# Patient Record
Sex: Female | Born: 1970
Health system: Southern US, Community
[De-identification: ages and names within clinical notes are randomized; demographics above are authoritative.]

## PROBLEM LIST (undated history)

## (undated) DIAGNOSIS — F419 Anxiety disorder, unspecified: Secondary | ICD-10-CM

## (undated) DIAGNOSIS — K5900 Constipation, unspecified: Secondary | ICD-10-CM

## (undated) DIAGNOSIS — R011 Cardiac murmur, unspecified: Secondary | ICD-10-CM

## (undated) DIAGNOSIS — B009 Herpesviral infection, unspecified: Secondary | ICD-10-CM

## (undated) DIAGNOSIS — T7840XA Allergy, unspecified, initial encounter: Secondary | ICD-10-CM

## (undated) DIAGNOSIS — N200 Calculus of kidney: Secondary | ICD-10-CM

## (undated) DIAGNOSIS — F319 Bipolar disorder, unspecified: Secondary | ICD-10-CM

## (undated) DIAGNOSIS — I10 Essential (primary) hypertension: Secondary | ICD-10-CM

## (undated) DIAGNOSIS — H409 Unspecified glaucoma: Secondary | ICD-10-CM

## (undated) DIAGNOSIS — G039 Meningitis, unspecified: Secondary | ICD-10-CM

## (undated) DIAGNOSIS — D649 Anemia, unspecified: Secondary | ICD-10-CM

## (undated) DIAGNOSIS — F32A Depression, unspecified: Secondary | ICD-10-CM

## (undated) DIAGNOSIS — I6529 Occlusion and stenosis of unspecified carotid artery: Secondary | ICD-10-CM

## (undated) DIAGNOSIS — J9819 Other pulmonary collapse: Secondary | ICD-10-CM

## (undated) DIAGNOSIS — E119 Type 2 diabetes mellitus without complications: Secondary | ICD-10-CM

## (undated) DIAGNOSIS — M4120 Other idiopathic scoliosis, site unspecified: Secondary | ICD-10-CM

## (undated) HISTORY — DX: Type 2 diabetes mellitus without complications: E11.9

## (undated) HISTORY — DX: Constipation, unspecified: K59.00

## (undated) HISTORY — DX: Calculus of kidney: N20.0

## (undated) HISTORY — DX: Other idiopathic scoliosis, site unspecified: M41.20

## (undated) HISTORY — DX: Anxiety disorder, unspecified: F41.9

## (undated) HISTORY — DX: Herpesviral infection, unspecified: B00.9

## (undated) HISTORY — DX: Allergy, unspecified, initial encounter: T78.40XA

## (undated) HISTORY — DX: Meningitis, unspecified: G03.9

## (undated) HISTORY — DX: Depression, unspecified: F32.A

## (undated) HISTORY — DX: Anemia, unspecified: D64.9

## (undated) HISTORY — DX: Bipolar disorder, unspecified: F31.9

## (undated) HISTORY — DX: Unspecified glaucoma: H40.9

## (undated) HISTORY — PX: TYMPANOSTOMY TUBE PLACEMENT: SHX32

## (undated) HISTORY — DX: Cardiac murmur, unspecified: R01.1

## (undated) HISTORY — PX: CHEST TUBE INSERTION: SHX231

## (undated) HISTORY — DX: Other pulmonary collapse: J98.19

## (undated) HISTORY — DX: Occlusion and stenosis of unspecified carotid artery: I65.29

---

## 1998-03-14 ENCOUNTER — Emergency Department (HOSPITAL_COMMUNITY): Admission: EM | Admit: 1998-03-14 | Discharge: 1998-03-14 | Payer: Self-pay | Admitting: Emergency Medicine

## 1999-11-27 ENCOUNTER — Ambulatory Visit (HOSPITAL_COMMUNITY): Admission: RE | Admit: 1999-11-27 | Discharge: 1999-11-27 | Payer: Self-pay | Admitting: *Deleted

## 2000-07-13 ENCOUNTER — Emergency Department (HOSPITAL_COMMUNITY): Admission: EM | Admit: 2000-07-13 | Discharge: 2000-07-13 | Payer: Self-pay | Admitting: Emergency Medicine

## 2000-07-13 ENCOUNTER — Encounter: Payer: Self-pay | Admitting: Emergency Medicine

## 2001-01-29 ENCOUNTER — Encounter: Payer: Self-pay | Admitting: Internal Medicine

## 2001-01-29 ENCOUNTER — Ambulatory Visit (HOSPITAL_COMMUNITY): Admission: RE | Admit: 2001-01-29 | Discharge: 2001-01-29 | Payer: Self-pay | Admitting: Internal Medicine

## 2001-03-11 ENCOUNTER — Encounter: Payer: Self-pay | Admitting: Family Medicine

## 2001-03-11 ENCOUNTER — Ambulatory Visit (HOSPITAL_COMMUNITY): Admission: RE | Admit: 2001-03-11 | Discharge: 2001-03-11 | Payer: Self-pay | Admitting: Family Medicine

## 2001-08-17 ENCOUNTER — Encounter: Payer: Self-pay | Admitting: Internal Medicine

## 2001-08-17 ENCOUNTER — Encounter: Payer: Self-pay | Admitting: Emergency Medicine

## 2001-08-17 ENCOUNTER — Inpatient Hospital Stay (HOSPITAL_COMMUNITY): Admission: EM | Admit: 2001-08-17 | Discharge: 2001-08-25 | Payer: Self-pay | Admitting: *Deleted

## 2001-08-23 ENCOUNTER — Encounter: Payer: Self-pay | Admitting: Internal Medicine

## 2001-08-26 ENCOUNTER — Emergency Department (HOSPITAL_COMMUNITY): Admission: EM | Admit: 2001-08-26 | Discharge: 2001-08-26 | Payer: Self-pay

## 2001-08-30 ENCOUNTER — Encounter: Admission: RE | Admit: 2001-08-30 | Discharge: 2001-08-30 | Payer: Self-pay | Admitting: Internal Medicine

## 2001-10-02 ENCOUNTER — Encounter: Payer: Self-pay | Admitting: Emergency Medicine

## 2001-10-02 ENCOUNTER — Emergency Department (HOSPITAL_COMMUNITY): Admission: EM | Admit: 2001-10-02 | Discharge: 2001-10-02 | Payer: Self-pay | Admitting: Emergency Medicine

## 2001-10-13 ENCOUNTER — Ambulatory Visit (HOSPITAL_COMMUNITY): Admission: RE | Admit: 2001-10-13 | Discharge: 2001-10-13 | Payer: Self-pay | Admitting: Internal Medicine

## 2001-11-03 ENCOUNTER — Ambulatory Visit (HOSPITAL_COMMUNITY): Admission: RE | Admit: 2001-11-03 | Discharge: 2001-11-03 | Payer: Self-pay | Admitting: Internal Medicine

## 2003-03-02 ENCOUNTER — Other Ambulatory Visit: Admission: RE | Admit: 2003-03-02 | Discharge: 2003-03-02 | Payer: Self-pay | Admitting: Family Medicine

## 2003-11-17 ENCOUNTER — Emergency Department (HOSPITAL_COMMUNITY): Admission: EM | Admit: 2003-11-17 | Discharge: 2003-11-17 | Payer: Self-pay | Admitting: Emergency Medicine

## 2004-03-14 ENCOUNTER — Other Ambulatory Visit: Admission: RE | Admit: 2004-03-14 | Discharge: 2004-03-14 | Payer: Self-pay | Admitting: Family Medicine

## 2004-11-06 ENCOUNTER — Inpatient Hospital Stay (HOSPITAL_COMMUNITY): Admission: EM | Admit: 2004-11-06 | Discharge: 2004-11-08 | Payer: Self-pay | Admitting: *Deleted

## 2004-11-07 ENCOUNTER — Encounter (INDEPENDENT_AMBULATORY_CARE_PROVIDER_SITE_OTHER): Payer: Self-pay | Admitting: Cardiology

## 2004-11-11 ENCOUNTER — Emergency Department (HOSPITAL_COMMUNITY): Admission: EM | Admit: 2004-11-11 | Discharge: 2004-11-11 | Payer: Self-pay | Admitting: Emergency Medicine

## 2004-11-21 ENCOUNTER — Encounter: Admission: RE | Admit: 2004-11-21 | Discharge: 2004-11-21 | Payer: Self-pay | Admitting: Family Medicine

## 2004-12-08 ENCOUNTER — Encounter: Admission: RE | Admit: 2004-12-08 | Discharge: 2004-12-08 | Payer: Self-pay | Admitting: Family Medicine

## 2005-06-05 ENCOUNTER — Encounter: Admission: RE | Admit: 2005-06-05 | Discharge: 2005-06-05 | Payer: Self-pay | Admitting: Family Medicine

## 2005-09-02 ENCOUNTER — Encounter: Admission: RE | Admit: 2005-09-02 | Discharge: 2005-09-02 | Payer: Self-pay | Admitting: Family Medicine

## 2008-05-08 ENCOUNTER — Other Ambulatory Visit (HOSPITAL_COMMUNITY): Admission: RE | Admit: 2008-05-08 | Discharge: 2008-05-26 | Payer: Self-pay | Admitting: Psychiatry

## 2008-05-08 ENCOUNTER — Ambulatory Visit: Payer: Self-pay | Admitting: Psychiatry

## 2008-10-26 ENCOUNTER — Emergency Department (HOSPITAL_COMMUNITY): Admission: EM | Admit: 2008-10-26 | Discharge: 2008-10-26 | Payer: Self-pay | Admitting: Family Medicine

## 2009-05-31 ENCOUNTER — Ambulatory Visit (HOSPITAL_COMMUNITY): Admission: RE | Admit: 2009-05-31 | Discharge: 2009-05-31 | Payer: Self-pay | Admitting: Psychiatry

## 2009-06-19 ENCOUNTER — Other Ambulatory Visit (HOSPITAL_COMMUNITY): Admission: RE | Admit: 2009-06-19 | Discharge: 2009-07-04 | Payer: Self-pay | Admitting: Psychiatry

## 2009-06-20 ENCOUNTER — Ambulatory Visit: Payer: Self-pay | Admitting: Psychiatry

## 2010-06-16 ENCOUNTER — Emergency Department (HOSPITAL_COMMUNITY): Admission: EM | Admit: 2010-06-16 | Discharge: 2010-06-16 | Payer: Self-pay | Admitting: Family Medicine

## 2010-08-11 ENCOUNTER — Encounter: Payer: Self-pay | Admitting: Obstetrics and Gynecology

## 2010-10-30 ENCOUNTER — Ambulatory Visit (HOSPITAL_COMMUNITY)
Admission: RE | Admit: 2010-10-30 | Discharge: 2010-10-30 | Disposition: A | Discharge status: Home / Self Care | Payer: 59 | Source: Ambulatory Visit | Attending: Emergency Medicine | Admitting: Emergency Medicine

## 2010-10-30 ENCOUNTER — Encounter (HOSPITAL_COMMUNITY): Payer: Self-pay

## 2010-10-30 ENCOUNTER — Other Ambulatory Visit: Payer: Self-pay | Admitting: Emergency Medicine

## 2010-10-30 DIAGNOSIS — R0789 Other chest pain: Secondary | ICD-10-CM | POA: Insufficient documentation

## 2010-10-30 DIAGNOSIS — R000 Tachycardia, unspecified: Secondary | ICD-10-CM

## 2010-10-30 DIAGNOSIS — K7689 Other specified diseases of liver: Secondary | ICD-10-CM | POA: Insufficient documentation

## 2010-10-30 DIAGNOSIS — R609 Edema, unspecified: Secondary | ICD-10-CM | POA: Insufficient documentation

## 2010-10-30 DIAGNOSIS — R160 Hepatomegaly, not elsewhere classified: Secondary | ICD-10-CM | POA: Insufficient documentation

## 2010-10-30 DIAGNOSIS — R0602 Shortness of breath: Secondary | ICD-10-CM | POA: Insufficient documentation

## 2010-10-30 HISTORY — DX: Essential (primary) hypertension: I10

## 2010-10-30 MED ORDER — IOHEXOL 300 MG/ML  SOLN
100.0000 mL | Freq: Once | INTRAMUSCULAR | Status: AC | PRN
  Administered 2010-10-30: 100 mL via INTRAVENOUS

## 2010-11-03 ENCOUNTER — Inpatient Hospital Stay (INDEPENDENT_AMBULATORY_CARE_PROVIDER_SITE_OTHER)
Admission: RE | Admit: 2010-11-03 | Discharge: 2010-11-03 | Disposition: A | Discharge status: Home / Self Care | Payer: 59 | Source: Ambulatory Visit | Attending: Family Medicine | Admitting: Family Medicine

## 2010-11-03 DIAGNOSIS — M79609 Pain in unspecified limb: Secondary | ICD-10-CM

## 2010-12-06 NOTE — Op Note (Signed)
[place]  Patient:    Selena Holt, Selena Holt Visit Number: [id number] MRN: [id number]          Service Type: MED Location: [id number] Attending Physician:  Alfonso Ramus Dictated by:   Kristine Garbe Ezzard Standing, M.D. Proc. Date: 08/19/01 Admit Date:  08/17/2001                             Operative Report  PREOPERATIVE DIAGNOSIS:  Right otitis media with effusion.  History of pneumococcal meningitis.  POSTOPERATIVE DIAGNOSIS:  Right otitis media with effusion.  History of pneumococcal meningitis.  OPERATION PERFORMED:  Right myringotomy and tube (Paparella type 1 tube).  SURGEON:  Kristine Garbe. Ezzard Standing, M.D.  ANESTHESIA:  Local phenol topical.  DESCRIPTION OF PROCEDURE:  The patient was brought from her room to the operating room.  The ear canal was examined.  There were some inflammatory changes at the TM as well as the ear canal making placement of the tube anteriorly difficult.  The tube was placed posteriorly inferiorly in the TM. Phenol was applied to the posterior portion of the TM for local anesthetic. Myringotomy was made.  A mucoserous fluid was aspirated from the middle ear space and a Paparella type 1 tube was inserted via the myringotomy site followed by Pedotic ear drops.  Selena Holt tolerated this well and was transferred back to her room.  DISPOSITION:   She was instructed to use Pedotic ear drops, 3 or 4 drops twice a day for the next four days. Dictated by:   Kristine Garbe Ezzard Standing, M.D. Attending Physician:  Alfonso Ramus DD:  08/19/01 TD:  08/20/01 Job: [id number]

## 2010-12-06 NOTE — Discharge Summary (Signed)
[place]  Patient:    Selena Holt, Selena Holt Visit Number: [id number] MRN: [id number]          Service Type: EMS Location: [place] Attending Physician:  Lorre Nick Dictated by:   Lendell Caprice, M.D. Admit Date:  10/02/2001 Discharge Date: 10/02/2001   CC:         [facility]  Dr. Kristine Garbe. Ezzard Standing, ENT  Dr. Roxan Hockey in infectious diseases   Discharge Summary  DISCHARGE DIAGNOSES:  1. Pneumococcal meningitis with bacteremia, current hospitalization.  2. Right otitis media with right mastoiditis, current hospitalization.  3. Hypertension.  4. Normocytic anemia.  5. Depression.  6. Hypokalemia.  7. History of asthma.  8. History of seasonal allergies.  9. Dyspepsia versus gastroesophageal reflux disease. 10. Vaginal spotting on Depo-Provera, current hospitalization. 11. Hyperglycemia secondary to steroids; hemoglobin A1c 5.1.  DISCHARGE MEDICATIONS:  1. Tequin 200 mg p.o. q.d. starting August 31, 2001 for two weeks of     therapy.  2. Rocephin 2 g IV q.12h. for five more days - to be done by [facility] in the home.  3. Claritin 10 mg p.o. q.d.  4. Flonase 50 mcg two sprays in each nostril once a day.  5. Wellbutrin 150 mg p.o. b.i.d.  6. Pepcid 20 mg p.o. b.i.d.  7. HCTZ 25 mg p.o. q.d.  8. Darvocet-N 100 one pill q.4h. p.r.n. pain.  FOLLOW-UP:  Ms. Jordahl will see Dr. Ezzard Standing in otolaryngology in follow-up on September 02, 2001 at 1:15 p.m. for follow-up of right myringotomy and right mastoiditis.  She will also report for hospital follow-up visit in internal medicine clinic to see Dr. Reed Pandy on August 30, 2001 at 1:30 p.m. at which time I will check a follow-up BMET to assess her electrolyte status, especially her potassium.  Home health care will be administering Ms. Carters IV antibiotic therapy for five more days after discharge.  She is instructed to return to the emergency room if symptoms of fever return or  neck stiffness or headache.  The patient was also instructed to call her OB/GYN to schedule a follow-up visit for her vaginal spotting on Depo-Provera.  PROCEDURES AND DIAGNOSTIC STUDIES: 1. A lumbar puncture was performed under fluoroscopy in radiology on August 17, 2001 without complications.  Four samples of cloudy spinal fluid    were sent to the laboratory and the closing pressure appeared to be    elevated.  The patient tolerated the procedure well. 2. Status post PICC line placement in radiology on August 23, 2001, placed in    the right arm without complications. 3. Head CT without contrast performed August 17, 2001.  Impression:    Opacified right mastoid.  There is a polyp associated with the right    maxillary sinus.  There is also minimal mucosal membrane thickening in    the region of the right ethmoid sinus. 4. Chest x-ray on August 17, 2001.  Impression:  No acute disease.  CONSULTANTS: 1. Ear, nose, and throat:  Dr. Ezzard Standing. 2. Infectious diseases:  Dr. Roxan Hockey.  HISTORY OF PRESENT ILLNESS:  Selena Holt is a 40 year old white female with a past medical history of depression, hypertension, and asthma who presented to the emergency room with a five-day history of headache and malaise.  History is provided by the patients mother and daughter who state that she was in her usual state of health until Friday when she developed a headache.  She  continued to feel bad over the weekend and yesterday started vomiting.  She saw her physician at [facility] and was diagnosed with otitis media with a TM perforation and given a prescription for Cipro, for which she took one dose of this medication.  Her headache progressed, suddenly got worse, and the patient complained of not being able to move because of severe neck pain.  At approximately 2 a.m. on the morning of admission, the patients daughter called the patients mother because the patient was getting worse.  When  the mother arrived the patient was complaining of decreased vision and was incontinent of urine secondary to her pain.  The patient had no true temperatures recorded; however, a subjective fever was reported.  ADMISSION LABORATORY DATA:  White count 16.2, hemoglobin 11.8, platelets 261, MCV 90.1, 93% polys, ANC 15.1, mild left shift.  Sodium 138, potassium 3.8, chloride 106, CO2 21, BUN 7, creatinine 1.1, glucose 187, alk phos 108, T bili 0.6, AST 20, ALT 23, total protein 7.4, albumin 3.8, calcium 9.4.  UA positive for 250 glucose, 15 ketones, 30 protein, negative micro.  CSF tube #1:  3800 white blood cells, 131 red blood cells, 96% polys; tube #2:  4600 white blood cells, 56 red blood cells, 99% polys, protein 163.  CSF glucose 71.  Gram stain showed abundant white blood cells, primarily polys, gram positive cocci in pairs and chains.  HOSPITAL COURSE: 1. PNEUMOCOCCAL MENINGITIS WITH BACTEREMIA:  The patient was admitted to the intensive care unit for observation and IV antibiotics.  The patients CSF grew out Streptococcus pneumoniae that was pansensitive.  However, initially the patient was started on Rocephin intravenously as well as vancomycin and Decadron intravenously.  The patients mental status improved during the first 24 hours of her admission.  She was alert and oriented on hospital day #2. She still complained of neck pain on hospital day #2 and #3; however, this improved throughout her hospitalization and she was free of neck pain and headache on the day of discharge.  She did describe mild symptoms of headache which she has with her sinus disease on the day of discharge and was given Darvocet to be used in the outpatient setting.  She was instructed if she started to have the recurrence of severe headache to return to the emergency room, as well as any evidence of fever or neck pain.  CSF cultures grew out  Streptococcus pneumoniae that were pansensitive, as well as her  blood cultures were also positive for Streptococcus pneumoniae.  The patient was hemodynamically-stable and survived her hospitalization without difficulty. On August 19, 2001 the vancomycin was discontinued because the sensitives showed pansensitive Streptococcus pneumoniae.  The etiology of this meningitis was likely secondary to an acute right otitis media that led to a right-sided mastoiditis.  ENT was consulted during this hospitalization and at first it was felt that no surgery was indicated.  Later in the hospitalization the patient complained of increased right ear pain and Dr. Ezzard Standing saw the patient and performed a myringotomy with tube placement in the right ear.  Ms. Brougham pain decreased significantly after this procedure and she will see Dr. Ezzard Standing in follow-up after discharge.  She was sent home on intravenous Rocephin which she will obtain a total of 14 days of IV therapy of this medication.  She will then start Tequin therapy because she is PENICILLIN allergic and this will continue for two more weeks orally.  The patient was seen by infectious diseases during  this hospitalization and we appreciate their input on this patient.  At their suggestion, the pneumococcal vaccine was administered for this patient and an HIV test was obtained and found to be negative.  The patient had a PICC line placed prior to discharge for receiving her IV antibiotic therapy at home per [facility].  #2 - OTITIS MEDIA OF THE RIGHT TYMPANIC MEMBRANE WITH MASTOIDITIS:  Patient underwent head CT which showed evidence of right mastoiditis.  ENT was consulted and the patient was not taken to surgery for the mastoiditis secondary to their feeling that there was no abscess or focal area to surgically intervene on.  The patient was treated with medications and did undergo a right myringotomy with tube placement in the right ear secondary to continued right ear pain.  The patient will be  discharged on antibiotics and will see Dr. Ezzard Standing in follow-up after discharge.  #3 - HISTORY OF ASTHMA:  The patient denies use of inhalers over the past 10 years; however, was started on Flonase and Claritin as she was taking those at home prior to admission.  She had no evidence of asthma exacerbation during this hospitalization.  #4 - GASTROESOPHAGEAL REFLUX DISEASE VERSUS DYSPEPSIA:  The patient had mild nausea with abdominal discomfort during her hospitalization that responded very well to Pepcid therapy.  She was discharged on this H2 blocker and can be followed up by her primary care physician at [facility].  #5 - HYPERTENSION:  The patients blood pressure was normal on admission and she reports taking propranolol in the past.  Secondary to her asthma we elected not to start propranolol and instead started hydrochlorothiazide which she was discharged on secondary to elevated blood pressures near her hospital discharge date.  Her blood pressures on hydrochlorothiazide were 120s to 130s systolic over 70s to 80s diastolic.  Her potassium will be checked at her hospital follow-up visit.  #6 - HYPERGLYCEMIA:  The patients CBG was elevated secondary to steroid use. Hemoglobin A1c was checked and found to be 5.1.  #7 - DEPRESSION:  The patient had several episodes of crying during this hospitalization and reports acute life stressors but denies suicidal ideation. She takes Wellbutrin and this was continued at discharge for her depression. She will be seen by her primary care physician at [facility] in follow-up.  #8 - VAGINAL SPOTTING:  The patient had one episode of vaginal spotting during her hospitalization and she is currently on Depo-Provera per her OB/GYN.  It is not surprising that this occurred secondary to her acute illness and she will be seen in follow-up after discharge by her primary GYN.  #9 - NORMOCYTIC ANEMIA:  The patients hemoglobin was as low as 9.3  during this hospitalization; however, she had a normal MCV and she is a menstruating female who is multiparous and this anemia is likely secondary to menstruation.  Her hemoglobin on the day of discharge was 12.4.  DISCHARGE LABORATORY DATA:  White blood count 10.3, hemoglobin 12.4, platelets 297.  Sodium 141, potassium 3.3, chloride 102, CO2 27, BUN 13, creatinine 1.0, glucose 119. Dictated by:   Lendell Caprice, M.D. Attending Physician:  Lorre Nick DD:  11/18/01 TD:  11/18/01 Job: [id number]

## 2010-12-06 NOTE — Discharge Summary (Signed)
Selena Holt, Selena Holt               ACCOUNT NO.:  [id number]   MEDICAL RECORD NO.:  [id number]          PATIENT TYPE:  INP   LOCATION:  2017                         FACILITY:  [facility]   PHYSICIAN:  Selena L. Ladona Ridgel, MD  DATE OF BIRTH:  May 29, 1971   DATE OF ADMISSION:  11/06/2004  DATE OF DISCHARGE:  11/08/2004                                 DISCHARGE SUMMARY   DISCHARGING DIAGNOSES:  1.  Sinus tachycardia with shortness of breath and chest discomfort to IV      Lopressor in the emergency room.  The patient was admitted and evaluated      for possible underlying ischemic heart disease.  Her 2-day MyoView      revealed no obvious ischemia or dysrhythmia.  During the course of the      hospital stay she had minor sinus tachycardia but no obvious SVT was      noted.  The patient reports that she intermittently with different      activities will have palpitations.  I will recommend that Dr. Kevan Holt      consider an electrophysiology outpatient appointment with a Holter      monitor.  2.  Depression.  The patient will be continued on her Depakote and      Wellbutrin at home.  The medications were held initially during the      hospital stay but will be restarted on the day of discharge.  3.  Mild hypotension.  The patient will hold her hydrochlorothiazide and      resume a lower dosed Lopressor until seen by Dr. Kevan Holt, at which time      she may need to have her medications retitrated upward.  4.  Hyperlipidemia.  At this time the patient will be given a prescription      for Zocor in light of her family history and risk factors for coronary      artery disease.  However, I have discussed dietary measures that she      could institute herself at home including weight loss and avoiding      cholesterol in her diet.  I will recommend further education by her      primary care physician and the patient may decide not to fill that      prescription if she chooses to do dietary control to  start.   MEDICATIONS AT THE TIME OF DISCHARGE WILL BE:  1.  Lopressor 25 mg twice daily.  2.  Zocor 40 mg q.h.s.  3.  Wellbutrin 300 mg q.h.s.  4.  Hydrochlorothiazide will be held until she sees Dr. Kevan Holt.  5.  Depakote 2000 mg q.h.s.   DIET:  She is to avoid cholesterol and no salt in her diet.   SPECIAL INSTRUCTIONS:  She did during the hospital stay have some discomfort  in the left arm where the IV was placed.  There is no erythema but if she  were to develop redness or swelling she should contact her primary care  physician.  I have apprised her of the signs and symptoms of infection.  FOLLOW UP:  I have asked her to see Dr. Kevan Holt next week to consider a  possible event monitor and to evaluate the changes in her medication.   HISTORY OF PRESENT ILLNESS:  The patient is a 40 year old white female who  presented to the emergency room with increased shortness of breath  associated with palpitations and some chest discomfort.  She was treated  with Lopressor and admitted to the telemetry floor for further evaluation.  During the hospital stay she did have a 2D echocardiogram which showed an  ejection fraction of 55-65%, regional wall motion abnormalities could not be  fully evaluated on this study.  There was mild mitral valvular regurgitation  and because of the inability to fully evaluate she was referred on to a 2-  day MyoView study which showed no obvious ischemia.  Further evaluations for  the patient's tachyarrhythmia were a thyroid study which showed a normal  TSH.  She also underwent an evaluation of her D-dimer which was negative  which risk stratified her for potential for PE.  A CT of the chest was not  completed during the course of this hospital stay.  Should she develop any  further pulmonary symptoms, however, a CT of her chest may be warranted to  rule out possible PE; however, her symptoms appear to be low probability for  this diagnosis.  On the day of  discharge, the patient underwent the second  half of her MyoView without complications.  As stated, there were no  ischemic changes. Her vital signs were noted to be afebrile at 97.4, blood  pressure ranged from 93/66 to 101/70.  There was value recorded earlier in  the day of 85/40.  We will, therefore, be recommending that she hold her  hydrochlorothiazide until she sees Dr. Kevan Holt and has further evaluation of  her blood pressure.  Her Lopressor also has been cut in half because of her  borderline low blood pressures.   PHYSICAL EXAM ON THE DAY OF DISCHARGE:  She is normocephalic, atraumatic.  Pupils equal, round, reactive to light.  Extraocular muscles are intact.  Mucous membranes are moist.  NECK:  Supple.  There is no JVD, no lymph nodes and no carotid bruits.  CHEST:  Clear to auscultation.  There is no rhonchi, rales or wheezes.  CARDIOVASCULAR:  Regular rate, rhythm.  Positive S1, S2.  No S3, S4.  No  murmurs, rubs or gallops.  ABDOMEN:  Soft, nontender, nondistended.  EXTREMITIES:  Show no clubbing, cyanosis or edema.  Left arm shows no  erythema at the previous IV site.  The area is moderately tender but shows  no signs of infection.  NEUROLOGICALLY:  She is nonfocal.   PERTINENT LABORATORY VALUES DURING THE COURSE OF THE HOSPITAL STAY:  As  stated, her TSH is within normal limits.  A urine drug screen was negative.  D-dimer was within normal limits.  Cardiac enzymes showed a very slight  elevation in her troponin and very slight elevation in her CK of unclear  significance.  A chest x-ray showed only peribronchial thickening with poor  inspiratory effort.   At this time, the patient is deemed stable for discharge for followup as an  outpatient.  As stated, we will recommend a query of cardiology regarding  possible Holter monitor as the patient still reports periods of  tachyarrhythmia during and without exertion that may not have been representatively investigated with  the Cardiolite stress.  I, therefore, am  recommending that she  have an event monitor placed, if this is appropriate  with cardiology, and would request her primary care physician to inquire  regarding this.   At the time of discharge the patient is deemed stable.  She will followup  with Dr. Kevan Holt next week and understands that her medications have been  changed.  We have reviewed dietary changes and I have given her the option  to hold the Zocor prescription should she choose to try lifestyle  modifications for her elevated cholesterol.      MLT/MEDQ  D:  11/08/2004  T:  11/10/2004  Job:  [id number]   cc:   Duncan Dull, M.D.  [address]  [place]  [place] [address]  Fax: [phone]   Meade Maw, M.D.  [address]  [place]  [place] [address]  Fax: [phone]

## 2012-06-01 ENCOUNTER — Emergency Department (HOSPITAL_COMMUNITY)
Admission: EM | Admit: 2012-06-01 | Discharge: 2012-06-01 | Disposition: A | Discharge status: Home / Self Care | Payer: Self-pay | Attending: Emergency Medicine | Admitting: Emergency Medicine

## 2012-06-01 ENCOUNTER — Encounter (HOSPITAL_COMMUNITY): Payer: Self-pay | Admitting: *Deleted

## 2012-06-01 ENCOUNTER — Emergency Department (HOSPITAL_COMMUNITY): Payer: Self-pay

## 2012-06-01 DIAGNOSIS — J069 Acute upper respiratory infection, unspecified: Secondary | ICD-10-CM

## 2012-06-01 DIAGNOSIS — M549 Dorsalgia, unspecified: Secondary | ICD-10-CM | POA: Insufficient documentation

## 2012-06-01 DIAGNOSIS — R0789 Other chest pain: Secondary | ICD-10-CM | POA: Insufficient documentation

## 2012-06-01 DIAGNOSIS — Z79899 Other long term (current) drug therapy: Secondary | ICD-10-CM | POA: Insufficient documentation

## 2012-06-01 DIAGNOSIS — I10 Essential (primary) hypertension: Secondary | ICD-10-CM | POA: Insufficient documentation

## 2012-06-01 LAB — BASIC METABOLIC PANEL
BUN: 9 mg/dL (ref 6–23)
CO2: 26 mEq/L (ref 19–32)
Calcium: 10.1 mg/dL (ref 8.4–10.5)
Chloride: 99 mEq/L (ref 96–112)
Creatinine, Ser: 0.68 mg/dL (ref 0.50–1.10)
GFR calc Af Amer: >90 mL/min (ref >90)
GFR calc non Af Amer: >90 mL/min (ref >90)
Glucose, Bld: 149 mg/dL — ABNORMAL HIGH (ref 70–99)
Potassium: 4.1 mEq/L (ref 3.5–5.1)
Sodium: 138 mEq/L (ref 135–145)

## 2012-06-01 LAB — CBC WITH DIFFERENTIAL/PLATELET
Basophils Absolute: 0 10*3/uL (ref 0.0–0.1)
Basophils Relative: 0 % (ref 0–1)
Eosinophils Absolute: 0.1 10*3/uL (ref 0.0–0.7)
Eosinophils Relative: 1 % (ref 0–5)
HCT: 36.5 % (ref 36.0–46.0)
Hemoglobin: 12.4 g/dL (ref 12.0–15.0)
Lymphocytes Relative: 13 % (ref 12–46)
Lymphs Abs: 1.2 10*3/uL (ref 0.7–4.0)
MCH: 29.9 pg (ref 26.0–34.0)
MCHC: 34 g/dL (ref 30.0–36.0)
MCV: 88 fL (ref 78.0–100.0)
Monocytes Absolute: 0.7 10*3/uL (ref 0.1–1.0)
Monocytes Relative: 8 % (ref 3–12)
Neutro Abs: 7.1 10*3/uL (ref 1.7–7.7)
Neutrophils Relative %: 78 % — ABNORMAL HIGH (ref 43–77)
Platelets: 253 10*3/uL (ref 150–400)
RBC: 4.15 MIL/uL (ref 3.87–5.11)
RDW: 14 % (ref 11.5–15.5)
WBC: 9.1 10*3/uL (ref 4.0–10.5)

## 2012-06-01 LAB — POCT I-STAT TROPONIN I
Troponin i, poc: 0 ng/mL (ref 0.00–0.08)
Troponin i, poc: 0 ng/mL (ref 0.00–0.08)

## 2012-06-01 MED ORDER — ACETAMINOPHEN 500 MG PO TABS: 500.0000 mg | ORAL_TABLET | Freq: Four times a day (QID) | ORAL | Status: DC | PRN

## 2012-06-01 MED ORDER — IBUPROFEN 600 MG PO TABS: 600.0000 mg | ORAL_TABLET | Freq: Four times a day (QID) | ORAL | Status: DC | PRN

## 2012-06-01 MED ORDER — KETOROLAC TROMETHAMINE 30 MG/ML IJ SOLN
30.0000 mg | Freq: Once | INTRAMUSCULAR | Status: AC
  Administered 2012-06-01: 30 mg via INTRAVENOUS
  Filled 2012-06-01: qty 1

## 2012-06-01 MED ORDER — SODIUM CHLORIDE 0.9 % IV BOLUS (SEPSIS)
1000.0000 mL | Freq: Once | INTRAVENOUS | Status: AC
  Administered 2012-06-01: 1000 mL via INTRAVENOUS

## 2012-06-01 NOTE — ED Notes (Signed)
Pt had diarrhea for 2 weeks, then started with congestion and now hurting in chest and back and coughing up thick green sputum.  Pt reports some chills and breaks out into sweats

## 2012-06-01 NOTE — ED Provider Notes (Addendum)
41 year old female with a two-day history of postnasal drip, cough productive of green sputum. There's been no fever or chills. X-ray is unremarkable. She will be treated as a viral URI.  I saw and evaluated the patient, reviewed the resident's note and I agree with the findings and plan.   Dione Booze, MD 06/01/12 [id number]   Date: 06/01/2012  Rate: 118  Rhythm: sinus tachycardia  QRS Axis: normal  Intervals: normal  ST/T Wave abnormalities: normal  Conduction Disutrbances:none  Narrative Interpretation:  sinus tachycardia, poor R-wave progression across precordium. When compared with ECG of 11/07/2004, no significant changes are seen   Old EKG Reviewed: unchanged    Dione Booze, MD 06/01/12 [phone]

## 2012-06-01 NOTE — ED Provider Notes (Signed)
History     CSN: [id number]  Arrival date & time 06/01/12  1152   First MD Initiated Contact with Patient 06/01/12 1510      No chief complaint on file.   (Consider location/radiation/quality/duration/timing/severity/associated sxs/prior treatment) HPI Comments: The patient presents with 2 days of upper respiratory symptoms including green sputum cough, postnasal drip, mild shortness of breath, mild to moderate pain over her left chest and left and right back. She has also had chills. She mentioned diarrhea, but she had one week of diarrhea and this resolved one week ago, since then she has had normal bowel movements. Denies any blood in her stool, recent antibiotic use, travel, sick contacts. She is not on any hormones but does have an IUD for contraception.  Patient is a 41 y.o. female presenting with URI and general illness. The history is provided by the patient. No language interpreter was used.  URI The primary symptoms include sore throat (mild) and cough (green sput). Primary symptoms do not include fever, fatigue, headaches, ear pain, wheezing, abdominal pain, nausea, vomiting, arthralgias or rash. The current episode started 2 days ago. This is a new problem. The problem has been gradually worsening.  The sore throat is not accompanied by stridor.  Symptoms associated with the illness include chills, congestion and rhinorrhea. The illness is not associated with plugged ear sensation, facial pain or sinus pressure. The following treatments were addressed: NSAIDs were effective.  Illness  The current episode started 2 days ago. The onset was gradual. The problem occurs continuously. The problem has been gradually worsening. The problem is moderate. The symptoms are relieved by one or more OTC medications. Nothing aggravates the symptoms. Associated symptoms include diarrhea (resolved 1 wk ago), congestion, rhinorrhea, sore throat (mild), cough (green sput) and URI. Pertinent negatives  include no fever, no abdominal pain, no nausea, no vomiting, no ear pain, no headaches, no stridor, no neck pain, no wheezing, no rash and no eye discharge.    Past Medical History  Diagnosis Date  . Hypertension     History reviewed. No pertinent past surgical history.  No family history on file.  History  Substance Use Topics  . Smoking status: Never Smoker   . Smokeless tobacco: Not on file  . Alcohol Use: No     Comment: occ    OB History    Grav Para Term Preterm Abortions TAB SAB Ect Mult Living                  Review of Systems  Constitutional: Positive for chills. Negative for fever, activity change, appetite change and fatigue.  HENT: Positive for congestion, sore throat (mild) and rhinorrhea. Negative for ear pain, neck pain, neck stiffness and sinus pressure.   Eyes: Negative for discharge and visual disturbance.  Respiratory: Positive for cough (green sput) and shortness of breath (mild on exertion). Negative for chest tightness, wheezing and stridor.   Cardiovascular: Positive for chest pain (L chest and wraps around to L back, sharp, constant, better w/ motrin, nothing makes it worse. not pleuritic. similar to prior episdoes of pain when she has been sick in the past.). Negative for leg swelling.  Gastrointestinal: Positive for diarrhea (resolved 1 wk ago). Negative for nausea, vomiting, abdominal pain and abdominal distention.  Genitourinary: Negative for decreased urine volume and difficulty urinating.  Musculoskeletal: Positive for back pain (bilat, R and L upper). Negative for arthralgias.  Skin: Negative for color change, pallor and rash.  Neurological: Negative for weakness,  light-headedness and headaches.  Psychiatric/Behavioral: Negative for behavioral problems and agitation.  All other systems reviewed and are negative.    Allergies  Tetanus toxoids; Ceclor; Penicillins; and Sulfa antibiotics  Home Medications   Current Outpatient Rx  Name   Route  Sig  Dispense  Refill  . ARIPIPRAZOLE 5 MG PO TABS   Oral   Take 10 mg by mouth at bedtime.         . IBUPROFEN 200 MG PO TABS   Oral   Take 400 mg by mouth every 6 (six) hours as needed. For pain         . LAMOTRIGINE 100 MG PO TABS   Oral   Take 200 mg by mouth at bedtime.         Marland Kitchen OVER THE COUNTER MEDICATION   Oral   Take 2 capsules by mouth 2 (two) times daily as needed. Over the counter cough and cold medicine For cold and congestion         . NYQUIL PO   Oral   Take 2 capsules by mouth at bedtime as needed. For cold symptoms         . SERTRALINE HCL 100 MG PO TABS   Oral   Take 200 mg by mouth at bedtime.           BP 127/76  Pulse 114  Temp 98 F (36.7 C) (Oral)  Resp 18  SpO2 99%  Physical Exam  Nursing note and vitals reviewed. Constitutional: She is oriented to person, place, and time. She appears well-developed and well-nourished. No distress.  HENT:  Head: Normocephalic and atraumatic.  Mouth/Throat: No oropharyngeal exudate.  Eyes: EOM are normal. Pupils are equal, round, and reactive to light. Right eye exhibits no discharge. Left eye exhibits no discharge.  Neck: Normal range of motion. Neck supple. No JVD present.  Cardiovascular: Regular rhythm and normal heart sounds.        Sinus tac   Pulmonary/Chest: Effort normal and breath sounds normal. No stridor. No respiratory distress. She has no wheezes. She has no rales. She exhibits no tenderness.  Abdominal: Soft. Bowel sounds are normal. She exhibits no distension. There is no tenderness. There is no guarding.  Musculoskeletal: Normal range of motion. She exhibits no edema and no tenderness.  Neurological: She is alert and oriented to person, place, and time. No cranial nerve deficit. She exhibits normal muscle tone.  Skin: Skin is warm and dry. No rash noted. She is not diaphoretic.  Psychiatric: She has a normal mood and affect. Her behavior is normal. Judgment and thought  content normal.    ED Course  Procedures (including critical care time)  Labs Reviewed  CBC WITH DIFFERENTIAL - Abnormal; Notable for the following:    Neutrophils Relative 78 (*)     All other components within normal limits  BASIC METABOLIC PANEL - Abnormal; Notable for the following:    Glucose, Bld 149 (*)     All other components within normal limits  POCT I-STAT TROPONIN I  POCT I-STAT TROPONIN I   Dg Chest 2 View  06/01/2012  *RADIOLOGY REPORT*  Clinical Data: Shortness of breath, mid chest and left posterior chest pain, hypertension  CHEST - 2 VIEW  Comparison: 10/30/2010  Findings: Normal heart size, mediastinal contours, and pulmonary vascularity. Mild chronic bronchitic changes. Lungs otherwise clear. No pleural effusion or pneumothorax. No acute bony abnormalities. Levoconvex upper thoracic scoliosis.  IMPRESSION: Mild chronic bronchitic changes.   Original  Report Authenticated By: Ulyses Southward, M.D.      1. Viral upper respiratory illness   2. Musculoskeletal chest pain   3. Back pain      Date: 06/01/2012  Rate: 120  Rhythm: normal sinus rhythm  QRS Axis: normal  Intervals: normal  ST/T Wave abnormalities: normal  Conduction Disutrbances: none  Narrative Interpretation: sinus tac  Old EKG Reviewed: No significant changes noted     MDM  3:21 PM c/w likely viral URI. She states her chest and back hurt normally when she gets any type of infection, it has improved w/ motrin, is mild. doubt ACS, TIMI 0. EKG w/o signs of isch/infarct. PE considered but pt has multiple other sxs c/w URI and no risk factors for PE. CXR NACPD, doubt PNA. Will give IVFs, toradol, reassess.  5:05 PM HR nml, asymptomatic, feels better. Likely URI. I have considered and doubt the possible etiologies of chest pain including acute coronary syndrome, pulmonary embolism, aortic dissection, pneumothorax, pneumonia, rib fracture, Boerhaave esophagus.  Pt deemed stable for discharge. Return  precautions were provided and pt expressed understanding to return to ED if any acute symptoms return. Follow up was instructed which pt also expressed understanding. All questions were answered and pt was in agreement w/ plan.        Warrick Parisian, MD 06/01/12 [phone]

## 2012-06-01 NOTE — ED Notes (Signed)
Shot ekg x2.  Patient moving.

## 2012-10-21 ENCOUNTER — Other Ambulatory Visit: Payer: Self-pay | Admitting: Family Medicine

## 2012-10-21 ENCOUNTER — Ambulatory Visit
Admission: RE | Admit: 2012-10-21 | Discharge: 2012-10-21 | Disposition: A | Discharge status: Home / Self Care | Payer: BC Managed Care – PPO | Source: Ambulatory Visit | Attending: Family Medicine | Admitting: Family Medicine

## 2012-10-21 ENCOUNTER — Encounter: Payer: Self-pay | Admitting: Family Medicine

## 2012-10-21 ENCOUNTER — Ambulatory Visit: Payer: 59 | Admitting: Family Medicine

## 2012-10-21 ENCOUNTER — Telehealth: Payer: Self-pay | Admitting: Family Medicine

## 2012-10-21 ENCOUNTER — Ambulatory Visit (INDEPENDENT_AMBULATORY_CARE_PROVIDER_SITE_OTHER): Payer: BC Managed Care – PPO | Admitting: Family Medicine

## 2012-10-21 VITALS — BP 120/68 | HR 98 | Temp 98.2°F | Resp 16 | Wt 166.0 lb

## 2012-10-21 DIAGNOSIS — R109 Unspecified abdominal pain: Secondary | ICD-10-CM

## 2012-10-21 DIAGNOSIS — R7309 Other abnormal glucose: Secondary | ICD-10-CM

## 2012-10-21 DIAGNOSIS — F319 Bipolar disorder, unspecified: Secondary | ICD-10-CM | POA: Insufficient documentation

## 2012-10-21 DIAGNOSIS — R739 Hyperglycemia, unspecified: Secondary | ICD-10-CM

## 2012-10-21 NOTE — Progress Notes (Signed)
Subjective:     Patient ID: Selena Holt, female   DOB: May 16, 1971, 42 y.o.   MRN: [id number]  HPI  Beginning March 24, the patient developed profuse vomiting and diarrhea. This persisted for proximally 5-7 days.   She denies any fevers.  Since that time the diarrhea and the vomiting has subsided however she now reports diffuse abdominal pressure, bloating, satiety, abdominal fullness.  She reports abdominal distention.  She denies any blood in her stool. She is having possibly one bowel movement a day which is very small. Past Medical History  Diagnosis Date  . Hypertension   . Bipolar disorder    Current Outpatient Prescriptions on File Prior to Visit  Medication Sig Dispense Refill  . acetaminophen (TYLENOL) 500 MG tablet Take 1 tablet (500 mg total) by mouth every 6 (six) hours as needed for pain.  30 tablet  0  . ARIPiprazole (ABILIFY) 5 MG tablet Take 10 mg by mouth at bedtime.      Marland Kitchen ibuprofen (ADVIL,MOTRIN) 600 MG tablet Take 1 tablet (600 mg total) by mouth every 6 (six) hours as needed for pain or fever. For pain  30 tablet  0  . lamoTRIgine (LAMICTAL) 100 MG tablet Take 200 mg by mouth at bedtime.      . sertraline (ZOLOFT) 100 MG tablet Take 200 mg by mouth at bedtime.       No current facility-administered medications on file prior to visit.    Review of Systems   re view of systems is otherwise negative Objective:   Physical Exam  Constitutional: She appears well-developed and well-nourished.  HENT:  Head: Normocephalic and atraumatic.  Eyes: Conjunctivae are normal. Pupils are equal, round, and reactive to light.  Neck: No thyromegaly present.  Cardiovascular: Normal rate, regular rhythm and normal heart sounds.   Pulmonary/Chest: Effort normal and breath sounds normal. No respiratory distress.  Abdominal: She exhibits distension. She exhibits no mass. There is no tenderness. There is no rebound and no guarding.      physical exam is significant for a mildly  distended abdomen with markedly diminished bowel sounds there is no guarding or rebound. She does have hypoactive bowel sounds in the right lower quadrant. Assessment:     Viral gastroenteritis now resolved.  Possible postinfectious ileus.    Plan:     1. Abdominal  pain, other specified site Begin Align one by mouth daily.  Push fluids.  Rule out electrolyte disturbances.  Rule out ileus by obtaining an abdominal 2 view x-ray. - DG Abd 2 Views; Future - Basic Metabolic Panel - Hepatic Function Panel - CBC with Differential

## 2012-10-21 NOTE — Telephone Encounter (Signed)
PT NOT A [facility] PT- SHE WILL TRY URGENT CARE- OR HER PCP

## 2012-10-22 ENCOUNTER — Telehealth: Payer: Self-pay | Admitting: Family Medicine

## 2012-10-22 LAB — CBC WITH DIFFERENTIAL/PLATELET
Basophils Absolute: 0 10*3/uL (ref 0.0–0.1)
Basophils Relative: 0 % (ref 0–1)
Eosinophils Absolute: 0.1 10*3/uL (ref 0.0–0.7)
Eosinophils Relative: 1 % (ref 0–5)
HCT: 32.6 % — ABNORMAL LOW (ref 36.0–46.0)
Hemoglobin: 10.9 g/dL — ABNORMAL LOW (ref 12.0–15.0)
Lymphocytes Relative: 20 % (ref 12–46)
Lymphs Abs: 1.9 10*3/uL (ref 0.7–4.0)
MCH: 29.5 pg (ref 26.0–34.0)
MCHC: 33.4 g/dL (ref 30.0–36.0)
MCV: 88.3 fL (ref 78.0–100.0)
Monocytes Absolute: 0.5 10*3/uL (ref 0.1–1.0)
Monocytes Relative: 5 % (ref 3–12)
Neutro Abs: 7.1 10*3/uL (ref 1.7–7.7)
Neutrophils Relative %: 74 % (ref 43–77)
Platelets: 236 10*3/uL (ref 150–400)
RBC: 3.69 MIL/uL — ABNORMAL LOW (ref 3.87–5.11)
RDW: 14.4 % (ref 11.5–15.5)
WBC: 9.6 10*3/uL (ref 4.0–10.5)

## 2012-10-22 LAB — HEPATIC FUNCTION PANEL
ALT: 37 U/L — ABNORMAL HIGH (ref 0–35)
AST: 26 U/L (ref 0–37)
Albumin: 4.3 g/dL (ref 3.5–5.2)
Alkaline Phosphatase: 100 U/L (ref 39–117)
Bilirubin, Direct: 0.1 mg/dL (ref 0.0–0.3)
Indirect Bilirubin: 0.4 mg/dL (ref 0.0–0.9)
Total Bilirubin: 0.5 mg/dL (ref 0.3–1.2)
Total Protein: 6 g/dL (ref 6.0–8.3)

## 2012-10-22 LAB — BASIC METABOLIC PANEL
BUN: 5 mg/dL — ABNORMAL LOW (ref 6–23)
CO2: 27 mEq/L (ref 19–32)
Calcium: 9 mg/dL (ref 8.4–10.5)
Chloride: 103 mEq/L (ref 96–112)
Creat: 0.49 mg/dL — ABNORMAL LOW (ref 0.50–1.10)
Glucose, Bld: 215 mg/dL — ABNORMAL HIGH (ref 70–99)
Potassium: 3.7 mEq/L (ref 3.5–5.3)
Sodium: 140 mEq/L (ref 135–145)

## 2012-10-22 LAB — HEMOGLOBIN A1C
Hgb A1c MFr Bld: 9.8 % — ABNORMAL HIGH (ref <5.7)
Mean Plasma Glucose: 235 mg/dL — ABNORMAL HIGH (ref <117)

## 2012-10-22 NOTE — Addendum Note (Signed)
Addended by: Lynnea Ferrier T on: 10/22/2012 07:12 AM   Modules accepted: Orders

## 2012-10-22 NOTE — Telephone Encounter (Signed)
I would give through the weekend, its too early (<24hrs).  Call if worsening

## 2012-10-22 NOTE — Telephone Encounter (Signed)
Pt aware.

## 2012-10-25 ENCOUNTER — Telehealth: Payer: Self-pay | Admitting: Family Medicine

## 2012-10-25 ENCOUNTER — Encounter (HOSPITAL_COMMUNITY): Payer: Self-pay | Admitting: Emergency Medicine

## 2012-10-25 ENCOUNTER — Emergency Department (INDEPENDENT_AMBULATORY_CARE_PROVIDER_SITE_OTHER)
Admission: EM | Admit: 2012-10-25 | Discharge: 2012-10-25 | Disposition: A | Discharge status: Home / Self Care | Payer: BC Managed Care – PPO | Source: Home / Self Care

## 2012-10-25 DIAGNOSIS — K5900 Constipation, unspecified: Secondary | ICD-10-CM

## 2012-10-25 NOTE — Telephone Encounter (Signed)
Pt aware.

## 2012-10-25 NOTE — ED Notes (Signed)
Pt c/o constipation x 5 days. On March 24th pt had diarrhea for about a week that subsided and now has constipation. Feels cramping in lower abdomen. Has tried prune juice, apple juice, stool softners with no relief. No bowel movements just gas and pain. Went to [facility] for xray last Thursday and no diagnosis was given per patient. Patient is alert and oriented.

## 2012-10-25 NOTE — Telephone Encounter (Signed)
Pt ware

## 2012-10-25 NOTE — Telephone Encounter (Signed)
Get mg citrate.  If no better, NTBS

## 2012-10-25 NOTE — ED Provider Notes (Addendum)
History     CSN: [id number]  Arrival date & time 10/25/12  1216   First MD Initiated Contact with Patient 10/25/12 1319      Chief Complaint  Patient presents with  . Constipation    (Consider location/radiation/quality/duration/timing/severity/associated sxs/prior treatment) Patient is a 42 y.o. female presenting with constipation.  Constipation   This is a 42 y/o female who presents with 8 days of constipation and progressive distension of her abdomen. She has been passing gas. She has taken an Philips stool softener and apple and prune juice which have been ineffective. She has not tried any laxatives.  She explains that prior to this she had 5 days of nausea/vomiting and diarrhea. She did not take any Imodium or Lomotil. She is currently not nauseated and has not lost her appetite.    Past Medical History  Diagnosis Date  . Hypertension   . Bipolar disorder     History reviewed. No pertinent past surgical history.  History reviewed. No pertinent family history.  History  Substance Use Topics  . Smoking status: Never Smoker   . Smokeless tobacco: Not on file  . Alcohol Use: No     Comment: occ    OB History   Grav Para Term Preterm Abortions TAB SAB Ect Mult Living                  Review of Systems  Constitutional: Positive for chills.  HENT: Negative.   Eyes: Negative.   Respiratory: Negative.   Cardiovascular: Negative.   Gastrointestinal: Positive for constipation.  Genitourinary: Negative.   Musculoskeletal: Negative.   Skin: Negative.   Neurological: Negative.   Hematological: Negative.   Psychiatric/Behavioral: Negative.     Allergies  Tetanus toxoids; Ceclor; Penicillins; and Sulfa antibiotics  Home Medications   Current Outpatient Rx  Name  Route  Sig  Dispense  Refill  . ARIPiprazole (ABILIFY) 5 MG tablet   Oral   Take 10 mg by mouth at bedtime.         . lamoTRIgine (LAMICTAL) 100 MG tablet   Oral   Take 200 mg by mouth at  bedtime.         . sertraline (ZOLOFT) 100 MG tablet   Oral   Take 200 mg by mouth at bedtime.         Marland Kitchen acetaminophen (TYLENOL) 500 MG tablet   Oral   Take 1 tablet (500 mg total) by mouth every 6 (six) hours as needed for pain.   30 tablet   0   . ibuprofen (ADVIL,MOTRIN) 600 MG tablet   Oral   Take 1 tablet (600 mg total) by mouth every 6 (six) hours as needed for pain or fever. For pain   30 tablet   0     BP 124/82  Pulse 100  Temp(Src) 98.1 F (36.7 C) (Oral)  SpO2 98%  Physical Exam  Constitutional: She is oriented to person, place, and time. She appears well-developed and well-nourished.  HENT:  Head: Normocephalic and atraumatic.  Eyes: Conjunctivae are normal. Pupils are equal, round, and reactive to light.  Neck: Normal range of motion. Neck supple.  Cardiovascular: Normal rate and regular rhythm.   Pulmonary/Chest: Effort normal and breath sounds normal.  Abdominal: Soft. Bowel sounds are normal. She exhibits distension. There is tenderness. There is no rebound and no guarding.  Significant distension and tenderness, masses of stool can be palpated in LUQ and RUQ.   Musculoskeletal: Normal range of motion.  Neurological: She is alert and oriented to person, place, and time.  Skin: Skin is warm and dry.  Psychiatric: She has a normal mood and affect. Her behavior is normal.    ED Course  Procedures (including critical care time)  Labs Reviewed - No data to display No results found.   1. Constipation       MDM  Patient has been given the following instructions to follow.   Dulcolax suppository once. Then Miralax twice a day. Metamucil- follow directions on bottle. If Miralax is ineffective try Dulcolax 10 mg tabs. If this is ineffective, you will need to buy an enema (fleets) and use as directed. Please note that since you are severely constipated, you should have 2 BMs daily for 3-4 days.        Calvert Cantor, MD 10/25/12 1401  Calvert Cantor, MD 10/25/12 1407

## 2012-10-26 ENCOUNTER — Encounter: Payer: Self-pay | Admitting: Family Medicine

## 2012-10-26 ENCOUNTER — Ambulatory Visit (INDEPENDENT_AMBULATORY_CARE_PROVIDER_SITE_OTHER): Payer: BC Managed Care – PPO | Admitting: Family Medicine

## 2012-10-26 VITALS — BP 126/70 | HR 86 | Temp 98.5°F | Resp 16 | Wt 164.0 lb

## 2012-10-26 DIAGNOSIS — IMO0001 Reserved for inherently not codable concepts without codable children: Secondary | ICD-10-CM

## 2012-10-26 MED ORDER — SITAGLIPTIN PHOS-METFORMIN HCL 50-1000 MG PO TABS: 1.0000 | ORAL_TABLET | Freq: Two times a day (BID) | ORAL | Status: DC

## 2012-10-26 NOTE — Progress Notes (Signed)
Subjective:     Patient ID: Selena Holt, female   DOB: [birth year], 42 y.o.   MRN: [id number]  HPI  At last office visit random labs revealed an elevated blood sugar greater than 200. The hemoglobin A1c was obtained and was found to be 9.8. Patient denies polyuria polydipsia or blurred vision.  She has a family history of diabetes in her father's side.  She is also currently taking Abilify and Lamictal. She states that her sugars have not been checked in several years.   Past Medical History  Diagnosis Date  . Hypertension   . Bipolar disorder    Current Outpatient Prescriptions on File Prior to Visit  Medication Sig Dispense Refill  . ARIPiprazole (ABILIFY) 5 MG tablet Take 10 mg by mouth at bedtime.      . lamoTRIgine (LAMICTAL) 100 MG tablet Take 200 mg by mouth at bedtime.      . sertraline (ZOLOFT) 100 MG tablet Take 200 mg by mouth at bedtime.       No current facility-administered medications on file prior to visit.   History   Social History  . Marital Status: Single    Spouse Name: N/A    Number of Children: N/A  . Years of Education: N/A   Occupational History  . Not on file.   Social History Main Topics  . Smoking status: Never Smoker   . Smokeless tobacco: Not on file  . Alcohol Use: No     Comment: occ  . Drug Use: No  . Sexually Active:    Other Topics Concern  . Not on file   Social History Narrative  . No narrative on file    Review of Systems  All other systems reviewed and are negative.       Objective:   Physical Exam  Cardiovascular: Normal rate, regular rhythm, normal heart sounds and intact distal pulses.   Pulmonary/Chest: Effort normal and breath sounds normal. No respiratory distress. She has no wheezes. She has no rales.  Abdominal: Soft. Bowel sounds are normal. She exhibits no distension. There is no tenderness. There is no rebound.       Assessment:     New onset diabetes mellitus type 2    Plan:     1. Type II or  unspecified type diabetes mellitus without mention of complication, uncontrolled Begin Janumet 50/1000 milligrams by mouth twice a day. Recheck here in 2 weeks.  Spent 20 minutes discussing a diabetic diet, therapeutic lifestyle changes. We'll also refer for diabetes education. - Ambulatory referral to diabetic education

## 2012-10-28 ENCOUNTER — Ambulatory Visit: Payer: BC Managed Care – PPO | Admitting: Family Medicine

## 2012-10-30 ENCOUNTER — Encounter: Payer: Self-pay | Admitting: Family Medicine

## 2012-10-30 DIAGNOSIS — B009 Herpesviral infection, unspecified: Secondary | ICD-10-CM | POA: Insufficient documentation

## 2012-11-04 ENCOUNTER — Telehealth: Payer: Self-pay | Admitting: Family Medicine

## 2012-11-04 NOTE — Telephone Encounter (Signed)
Pt aware.

## 2012-11-04 NOTE — Telephone Encounter (Signed)
I don't think so.  NTBS if worsening.

## 2012-11-06 ENCOUNTER — Emergency Department (HOSPITAL_COMMUNITY)
Admission: EM | Admit: 2012-11-06 | Discharge: 2012-11-07 | Disposition: A | Discharge status: Home / Self Care | Payer: BC Managed Care – PPO | Attending: Emergency Medicine | Admitting: Emergency Medicine

## 2012-11-06 DIAGNOSIS — R42 Dizziness and giddiness: Secondary | ICD-10-CM

## 2012-11-06 DIAGNOSIS — R7402 Elevation of levels of lactic acid dehydrogenase (LDH): Secondary | ICD-10-CM | POA: Insufficient documentation

## 2012-11-06 DIAGNOSIS — F319 Bipolar disorder, unspecified: Secondary | ICD-10-CM | POA: Insufficient documentation

## 2012-11-06 DIAGNOSIS — R5381 Other malaise: Secondary | ICD-10-CM | POA: Insufficient documentation

## 2012-11-06 DIAGNOSIS — R7401 Elevation of levels of liver transaminase levels: Secondary | ICD-10-CM | POA: Insufficient documentation

## 2012-11-06 DIAGNOSIS — N390 Urinary tract infection, site not specified: Secondary | ICD-10-CM | POA: Insufficient documentation

## 2012-11-06 DIAGNOSIS — R5383 Other fatigue: Secondary | ICD-10-CM | POA: Insufficient documentation

## 2012-11-06 DIAGNOSIS — R748 Abnormal levels of other serum enzymes: Secondary | ICD-10-CM

## 2012-11-06 DIAGNOSIS — Z79899 Other long term (current) drug therapy: Secondary | ICD-10-CM | POA: Insufficient documentation

## 2012-11-06 DIAGNOSIS — Z8619 Personal history of other infectious and parasitic diseases: Secondary | ICD-10-CM | POA: Insufficient documentation

## 2012-11-06 DIAGNOSIS — I10 Essential (primary) hypertension: Secondary | ICD-10-CM | POA: Insufficient documentation

## 2012-11-06 DIAGNOSIS — R350 Frequency of micturition: Secondary | ICD-10-CM | POA: Insufficient documentation

## 2012-11-06 LAB — COMPREHENSIVE METABOLIC PANEL
ALT: 73 U/L — ABNORMAL HIGH (ref 0–35)
AST: 30 U/L (ref 0–37)
Albumin: 3.9 g/dL (ref 3.5–5.2)
Alkaline Phosphatase: 118 U/L — ABNORMAL HIGH (ref 39–117)
BUN: 11 mg/dL (ref 6–23)
CO2: 23 mEq/L (ref 19–32)
Calcium: 9.6 mg/dL (ref 8.4–10.5)
Chloride: 101 mEq/L (ref 96–112)
Creatinine, Ser: 0.63 mg/dL (ref 0.50–1.10)
GFR calc Af Amer: >90 mL/min (ref >90)
GFR calc non Af Amer: >90 mL/min (ref >90)
Glucose, Bld: 113 mg/dL — ABNORMAL HIGH (ref 70–99)
Potassium: 3.7 mEq/L (ref 3.5–5.1)
Sodium: 138 mEq/L (ref 135–145)
Total Bilirubin: 0.2 mg/dL — ABNORMAL LOW (ref 0.3–1.2)
Total Protein: 6.9 g/dL (ref 6.0–8.3)

## 2012-11-06 LAB — URINALYSIS, ROUTINE W REFLEX MICROSCOPIC
Bilirubin Urine: NEGATIVE
Glucose, UA: NEGATIVE mg/dL
Ketones, ur: NEGATIVE mg/dL
Nitrite: NEGATIVE
Protein, ur: NEGATIVE mg/dL
Specific Gravity, Urine: 1.018 (ref 1.005–1.030)
Urobilinogen, UA: 0.2 mg/dL (ref 0.0–1.0)
pH: 5.5 (ref 5.0–8.0)

## 2012-11-06 LAB — CBC WITH DIFFERENTIAL/PLATELET
Basophils Absolute: 0 10*3/uL (ref 0.0–0.1)
Basophils Relative: 0 % (ref 0–1)
Eosinophils Absolute: 0.2 10*3/uL (ref 0.0–0.7)
Eosinophils Relative: 2 % (ref 0–5)
HCT: 31.2 % — ABNORMAL LOW (ref 36.0–46.0)
Hemoglobin: 10.7 g/dL — ABNORMAL LOW (ref 12.0–15.0)
Lymphocytes Relative: 28 % (ref 12–46)
Lymphs Abs: 2.6 10*3/uL (ref 0.7–4.0)
MCH: 29.7 pg (ref 26.0–34.0)
MCHC: 34.3 g/dL (ref 30.0–36.0)
MCV: 86.7 fL (ref 78.0–100.0)
Monocytes Absolute: 0.5 10*3/uL (ref 0.1–1.0)
Monocytes Relative: 5 % (ref 3–12)
Neutro Abs: 6.1 10*3/uL (ref 1.7–7.7)
Neutrophils Relative %: 65 % (ref 43–77)
Platelets: 266 10*3/uL (ref 150–400)
RBC: 3.6 MIL/uL — ABNORMAL LOW (ref 3.87–5.11)
RDW: 13.8 % (ref 11.5–15.5)
WBC: 9.3 10*3/uL (ref 4.0–10.5)

## 2012-11-06 LAB — GLUCOSE, CAPILLARY: Glucose-Capillary: 125 mg/dL — ABNORMAL HIGH (ref 70–99)

## 2012-11-06 LAB — URINE MICROSCOPIC-ADD ON

## 2012-11-06 MED ORDER — MECLIZINE HCL 25 MG PO TABS
25.0000 mg | ORAL_TABLET | Freq: Once | ORAL | Status: AC
  Administered 2012-11-06: 25 mg via ORAL
  Filled 2012-11-06: qty 1

## 2012-11-06 NOTE — ED Notes (Signed)
Pt states on Thursday became dizzy and it hasn't been alleviated. Denies problems with vision. Reports balance problems. Began new diabetes medication on April 8th.

## 2012-11-06 NOTE — ED Provider Notes (Signed)
History     CSN: [id number]  Arrival date & time 11/06/12  2015   First MD Initiated Contact with Patient 11/06/12 2230      Chief Complaint  Patient presents with  . Dizziness    (Consider location/radiation/quality/duration/timing/severity/associated sxs/prior treatment) The history is provided by the patient and medical records. No language interpreter was used.   Patient with pmh of htn, bipolar and newly diagnosed DM presents to the ED with cc  Dizziness. Patient states that 2 days ag she began feeling lightheaded, weakness and having sxs of presyncope around midday. She has this recurrently and it is not position related. Patient denies racing or skipping in her heart.  She has been feeling "washed out" ever since.  She denies any vertiginous sxs, hx of BPPV,  unilateral weakness, facial asymmetry, difficulty with speech,or change in gait. Patient c/o frequent urination and thirst and attributes this to her diabetes. She states that she has been taking janumet for the past month and does not suspect the medication as the cause of her sxs.  Patient did have light colored stool yesterday, she has also noticed swelling in her RUQ, but denies nausea, vomiting, hematochezia or melena. Denies fevers, chills, night sweats, unexplained weight loss. Patient was seen here in the ED for sever constipation on 10/25/2012 which was relieved but she has had diarrhea and loose stools since that time. Denies any recent viral infections, ear or sinus infection. Patient does feel a sense of fullness in the right ear.    Past Medical History  Diagnosis Date  . Hypertension   . Bipolar disorder   . HSV-1 (herpes simplex virus 1) infection     No past surgical history on file.  Family History  Problem Relation Age of Onset  . Hyperlipidemia Mother   . Hypertension Mother   . Hyperlipidemia Father   . Hypertension Father   . CAD Father     History  Substance Use Topics  . Smoking status: Never  Smoker   . Smokeless tobacco: Not on file  . Alcohol Use: No     Comment: occ    OB History   Grav Para Term Preterm Abortions TAB SAB Ect Mult Living                  Review of Systems  Constitutional: Positive for fatigue. Negative for fever, chills, activity change, appetite change and unexpected weight change.  HENT: Negative for ear pain, congestion, facial swelling, trouble swallowing, neck stiffness, voice change, sinus pressure, tinnitus and ear discharge.   Eyes: Negative for photophobia and visual disturbance.  Respiratory: Negative for shortness of breath.   Cardiovascular: Negative for chest pain, palpitations and leg swelling.  Gastrointestinal: Negative for nausea, vomiting, abdominal pain, diarrhea and constipation.  Genitourinary: Positive for frequency. Negative for dysuria, hematuria, vaginal bleeding, vaginal discharge and vaginal pain.  Musculoskeletal: Negative for myalgias and arthralgias.  Skin: Negative for rash.  Neurological: Positive for dizziness, weakness and light-headedness. Negative for tremors, seizures, syncope, facial asymmetry, speech difficulty, numbness and headaches.  Psychiatric/Behavioral: Negative for behavioral problems.  All other systems reviewed and are negative.    Allergies  Tetanus toxoids; Ceclor; Penicillins; and Sulfa antibiotics  Home Medications   Current Outpatient Rx  Name  Route  Sig  Dispense  Refill  . ARIPiprazole (ABILIFY) 5 MG tablet   Oral   Take 10 mg by mouth at bedtime.         . lamoTRIgine (LAMICTAL) 100 MG  tablet   Oral   Take 200 mg by mouth at bedtime.         . sertraline (ZOLOFT) 100 MG tablet   Oral   Take 200 mg by mouth at bedtime.         . sitaGLIPtan-metformin (JANUMET) 50-1000 MG per tablet   Oral   Take 1 tablet by mouth 2 (two) times daily with a meal.           BP 111/75  Pulse 83  Temp(Src) 97.8 F (36.6 C) (Oral)  Resp 18  SpO2 97%  Physical Exam  Constitutional:  She is oriented to person, place, and time. She appears well-developed and well-nourished. No distress.  HENT:  Head: Normocephalic and atraumatic.  Eyes: Conjunctivae are normal. No scleral icterus.  Neck: Normal range of motion.  Cardiovascular: Normal rate, regular rhythm and normal heart sounds.  Exam reveals no gallop and no friction rub.   No murmur heard. Pulmonary/Chest: Effort normal and breath sounds normal. No respiratory distress.  Abdominal: Soft. Bowel sounds are normal. She exhibits mass (hepatomegaly, liver is palpable approx 10  cm below costal margin). She exhibits no distension. There is no tenderness. There is no guarding.  Neurological: She is alert and oriented to person, place, and time. She has normal reflexes. She displays no tremor. No cranial nerve deficit or sensory deficit. She exhibits normal muscle tone. She displays no seizure activity. Coordination and gait normal. GCS eye subscore is 4. GCS verbal subscore is 5. GCS motor subscore is 6.  Eomi, no nystagmus  Skin: Skin is warm and dry. She is not diaphoretic.    ED Course  Procedures (including critical care time)  Labs Reviewed  GLUCOSE, CAPILLARY - Abnormal; Notable for the following:    Glucose-Capillary 125 (*)    All other components within normal limits  URINALYSIS, ROUTINE W REFLEX MICROSCOPIC - Abnormal; Notable for the following:    APPearance CLOUDY (*)    Hgb urine dipstick TRACE (*)    Leukocytes, UA SMALL (*)    All other components within normal limits  URINE MICROSCOPIC-ADD ON - Abnormal; Notable for the following:    Squamous Epithelial / LPF MANY (*)    Bacteria, UA MANY (*)    All other components within normal limits  URINE CULTURE  CBC WITH DIFFERENTIAL  COMPREHENSIVE METABOLIC PANEL   No results found.   1. UTI (lower urinary tract infection)   2. Dizziness   3. Elevated alkaline phosphatase level   4. Elevated ALT measurement       MDM  22:45 PM Patient with  dizzines, no nystagmus and no neurologic deficits. I have ordered basic labs/meclizine. I do not suspect stroke or BPPV. Symptoms do not sound neurologic.denies any cardiac sxs.  Patient does appear to have sig. Hepatomegaly on my exam.  VSS and no    00:24 Patient with apparent UTI. She has transaminitis but numbers are trending down since lst labs on 10/25/2012. The patient states that she is feeling much better after administration of the meclizine. I discusse the patient's la findings with her and she states that she was told previously that one of "mental health" medications was affecting her liver, but she was not sure which one.  I have strongly urged her to follow up with her PCP on this as soon as possible and that she will need a follow up US. Will d/c patient with meclizine and cipro. The patient appears reasonably screened and/or stabilized for  discharge and I doubt any other medical condition or other [facility] requiring further screening, evaluation, or treatment in the ED at this time prior to discharge.    Arthor Captain, PA-C 11/08/12 2217

## 2012-11-06 NOTE — ED Notes (Signed)
PA at bedside.

## 2012-11-06 NOTE — ED Notes (Signed)
Pt states that she is new diagnosed with diabetes, and started taking new medication on April 8th. Started experiencing dizziness on Thursday, and states feels like the room is spinning. Strength equal and good upper extremities. No facial droop noted.

## 2012-11-07 MED ORDER — MECLIZINE HCL 50 MG PO TABS: 50.0000 mg | ORAL_TABLET | Freq: Three times a day (TID) | ORAL | Status: DC | PRN

## 2012-11-07 MED ORDER — CIPROFLOXACIN HCL 250 MG PO TABS
250.0000 mg | ORAL_TABLET | Freq: Two times a day (BID) | ORAL | Status: DC
Start: 2012-11-07 — End: 2013-04-06

## 2012-11-08 LAB — URINE CULTURE: Colony Count: 30000

## 2012-11-09 ENCOUNTER — Encounter: Payer: BC Managed Care – PPO | Attending: Family Medicine | Admitting: *Deleted

## 2012-11-09 VITALS — Ht 65.0 in | Wt 160.5 lb

## 2012-11-09 DIAGNOSIS — IMO0001 Reserved for inherently not codable concepts without codable children: Secondary | ICD-10-CM

## 2012-11-09 DIAGNOSIS — E119 Type 2 diabetes mellitus without complications: Secondary | ICD-10-CM | POA: Insufficient documentation

## 2012-11-09 DIAGNOSIS — Z713 Dietary counseling and surveillance: Secondary | ICD-10-CM | POA: Insufficient documentation

## 2012-11-09 NOTE — ED Provider Notes (Signed)
Medical screening examination/treatment/procedure(s) were performed by non-physician practitioner and as supervising physician I was immediately available for consultation/collaboration.   Alissandra Geoffroy III, MD 11/09/12 1114 

## 2012-11-11 ENCOUNTER — Encounter: Payer: Self-pay | Admitting: Family Medicine

## 2012-11-11 ENCOUNTER — Ambulatory Visit (INDEPENDENT_AMBULATORY_CARE_PROVIDER_SITE_OTHER): Payer: BC Managed Care – PPO | Admitting: Family Medicine

## 2012-11-11 VITALS — BP 110/64 | HR 96 | Temp 98.2°F | Resp 16 | Wt 159.0 lb

## 2012-11-11 DIAGNOSIS — E119 Type 2 diabetes mellitus without complications: Secondary | ICD-10-CM | POA: Insufficient documentation

## 2012-11-11 DIAGNOSIS — IMO0001 Reserved for inherently not codable concepts without codable children: Secondary | ICD-10-CM

## 2012-11-11 MED ORDER — LANCETS MISC. KIT: 1.0000 | PACK | Freq: Two times a day (BID) | Status: DC

## 2012-11-11 NOTE — Progress Notes (Signed)
Subjective:     Patient ID: Selena Holt, female   DOB: 25-Jul-1970, 42 y.o.   MRN: [id number]  HPI   At last office visit random labs revealed an elevated blood sugar greater than 200. The hemoglobin A1c was obtained and was found to be 9.8. Patient denies polyuria polydipsia or blurred vision.  She has a family history of diabetes in her father's side.  She is also currently taking Abilify and Lamictal. She states that her sugars have not been checked in several years.  Therefore at last office visit, I began Janumet 50/1000 milligrams by mouth twice a day.  Spent 20 minutes discussing a diabetic diet, therapeutic lifestyle changes. We'll also refer for diabetes education.  She is here today for a recheck.  He was seen in the emergency room on 419. Air random sugar was 113. Also her urinalysis revealed no glucose.  Overall she states she is doing well. She denies any diarrhea on the metformin. She denies any polyuria or polydipsia. She is no longer dizzy which is what sent her to the emergency room. She is found to have a urinary tract infection at the emergency room. However symptoms have now completely resolved. She has gone to the diabetic educator and found to very informative chest 2 more visits scheduled. Past Medical History  Diagnosis Date  . Hypertension   . Bipolar disorder   . HSV-1 (herpes simplex virus 1) infection    Current Outpatient Prescriptions on File Prior to Visit  Medication Sig Dispense Refill  . ARIPiprazole (ABILIFY) 5 MG tablet Take 10 mg by mouth at bedtime.      . ciprofloxacin (CIPRO) 250 MG tablet Take 1 tablet (250 mg total) by mouth every 12 (twelve) hours.  10 tablet  0  . lamoTRIgine (LAMICTAL) 100 MG tablet Take 200 mg by mouth at bedtime.      . meclizine (ANTIVERT) 50 MG tablet Take 1 tablet (50 mg total) by mouth 3 (three) times daily as needed.  30 tablet  0  . sertraline (ZOLOFT) 100 MG tablet Take 200 mg by mouth at bedtime.      .  sitaGLIPtan-metformin (JANUMET) 50-1000 MG per tablet Take 1 tablet by mouth 2 (two) times daily with a meal.       No current facility-administered medications on file prior to visit.   History   Social History  . Marital Status: Single    Spouse Name: N/A    Number of Children: N/A  . Years of Education: N/A   Occupational History  . Not on file.   Social History Main Topics  . Smoking status: Never Smoker   . Smokeless tobacco: Not on file  . Alcohol Use: No     Comment: occ  . Drug Use: No  . Sexually Active: Not on file   Other Topics Concern  . Not on file   Social History Narrative  . No narrative on file    Review of Systems  All other systems reviewed and are negative.       Objective:   Physical Exam  Cardiovascular: Normal rate, regular rhythm, normal heart sounds and intact distal pulses.   Pulmonary/Chest: Effort normal and breath sounds normal. No respiratory distress. She has no wheezes. She has no rales.  Abdominal: Soft. Bowel sounds are normal. She exhibits no distension. There is no tenderness. There is no rebound.       Assessment:     New onset diabetes mellitus type 2  Plan:     Continue Janumet 50/1000 twice a day.  I've asked the patient to start checking her sugars fasting in the morning and 2 hours after meals. She is to record these for the next 2-3 weeks. She is to return for recheck in one month. At that time we'll review her blood sugars and titrate medications if necessary to achieve fasting blood sugars less than 1:30 and 2 postprandial sugars less than 160. We reiterated the diabetic diet.  I asked her to return fasting for a fasting lipid panel. I also discussed with beginning an ACE inhibitor for renal protection. However given her recent illnesses we elected to not start that medication at the present time to get her body a chance to recover.

## 2012-11-11 NOTE — Patient Instructions (Addendum)
Check blood sugar daily fasting in the morning (goal is less than 130). Check sugar 2 hours after dinner, goal is less than 160.   Check sugars for 2 weeks.  Write on paper.   I'd like to see these values in 2 weeks.

## 2012-11-12 ENCOUNTER — Encounter: Payer: Self-pay | Admitting: *Deleted

## 2012-11-12 NOTE — Patient Instructions (Signed)
Goals:  Follow Diabetes Meal Plan as instructed  Eat 3 meals and 2 snacks, every 3-5 hrs  Limit carbohydrate intake to 30-45 grams carbohydrate/meal  Limit carbohydrate intake to 15 grams carbohydrate/snack  Add lean protein foods to meals/snacks  Monitor glucose levels as instructed by your doctor  Aim for 15-30 mins of physical activity daily  Bring food record and glucose log to your next nutrition visit   

## 2012-11-12 NOTE — Progress Notes (Signed)
Patient was seen on 11/09/2012 for the first of a series of three diabetes self-management courses at [facility]. Patient's most recent A1c was 9.8 % on 07/22/2012 The following learning objectives were met by the patient during this course:   Defines the role of glucose and insulin  Identifies type of diabetes and pathophysiology  Defines the diagnostic criteria for diabetes and prediabetes  States the risk factors for Type 2 Diabetes  States the symptoms of Type 2 Diabetes  Defines Type 2 Diabetes treatment goals  Defines Type 2 Diabetes treatment options  States the rationale for glucose monitoring  Identifies A1C, glucose targets, and testing times  Identifies proper sharps disposal  Defines the purpose of a diabetes food plan  Identifies carbohydrate food groups  Defines effects of carbohydrate foods on glucose levels  Identifies carbohydrate choices/grams/food labels  States benefits of physical activity and effect on glucose  Review of suggested activity guidelines  Handouts given during class include:  Type 2 Diabetes: Basics Book  My Food Plan Book  Food and Activity Log   Follow-Up Plan: Core Class 2

## 2012-11-30 ENCOUNTER — Ambulatory Visit: Payer: BC Managed Care – PPO

## 2012-12-14 ENCOUNTER — Ambulatory Visit: Payer: BC Managed Care – PPO | Admitting: Family Medicine

## 2012-12-14 ENCOUNTER — Encounter: Payer: BC Managed Care – PPO | Attending: Family Medicine

## 2012-12-14 DIAGNOSIS — Z713 Dietary counseling and surveillance: Secondary | ICD-10-CM | POA: Insufficient documentation

## 2012-12-14 DIAGNOSIS — E119 Type 2 diabetes mellitus without complications: Secondary | ICD-10-CM | POA: Insufficient documentation

## 2013-04-06 ENCOUNTER — Emergency Department (HOSPITAL_COMMUNITY)
Admission: EM | Admit: 2013-04-06 | Discharge: 2013-04-06 | Discharge status: Against Medical Advice | Payer: BC Managed Care – PPO | Attending: Emergency Medicine | Admitting: Emergency Medicine

## 2013-04-06 ENCOUNTER — Encounter (HOSPITAL_COMMUNITY): Payer: Self-pay | Admitting: *Deleted

## 2013-04-06 DIAGNOSIS — R110 Nausea: Secondary | ICD-10-CM | POA: Insufficient documentation

## 2013-04-06 DIAGNOSIS — I10 Essential (primary) hypertension: Secondary | ICD-10-CM | POA: Insufficient documentation

## 2013-04-06 DIAGNOSIS — E119 Type 2 diabetes mellitus without complications: Secondary | ICD-10-CM | POA: Insufficient documentation

## 2013-04-06 DIAGNOSIS — R51 Headache: Secondary | ICD-10-CM | POA: Insufficient documentation

## 2013-04-06 LAB — GLUCOSE, CAPILLARY: Glucose-Capillary: 188 mg/dL — ABNORMAL HIGH (ref 70–99)

## 2013-04-06 NOTE — ED Notes (Signed)
CBG is 188. Notified Nurse Elliot Gurney.

## 2013-04-06 NOTE — ED Notes (Signed)
The pt has had a headache since 1000am today with nausea.  She has had migraine headaches in the past

## 2013-05-26 ENCOUNTER — Other Ambulatory Visit: Payer: Self-pay

## 2013-10-04 ENCOUNTER — Ambulatory Visit (INDEPENDENT_AMBULATORY_CARE_PROVIDER_SITE_OTHER): Payer: BC Managed Care – PPO | Admitting: Nurse Practitioner

## 2013-10-04 ENCOUNTER — Telehealth: Payer: Self-pay | Admitting: Nurse Practitioner

## 2013-10-04 ENCOUNTER — Encounter: Payer: Self-pay | Admitting: Nurse Practitioner

## 2013-10-04 VITALS — BP 137/85 | HR 91 | Temp 98.2°F | Resp 18 | Ht 65.0 in | Wt 153.0 lb

## 2013-10-04 DIAGNOSIS — R141 Gas pain: Secondary | ICD-10-CM

## 2013-10-04 DIAGNOSIS — R143 Flatulence: Secondary | ICD-10-CM

## 2013-10-04 DIAGNOSIS — R140 Abdominal distension (gaseous): Secondary | ICD-10-CM

## 2013-10-04 DIAGNOSIS — F319 Bipolar disorder, unspecified: Secondary | ICD-10-CM

## 2013-10-04 DIAGNOSIS — R142 Eructation: Secondary | ICD-10-CM

## 2013-10-04 DIAGNOSIS — E1165 Type 2 diabetes mellitus with hyperglycemia: Secondary | ICD-10-CM

## 2013-10-04 DIAGNOSIS — IMO0001 Reserved for inherently not codable concepts without codable children: Secondary | ICD-10-CM

## 2013-10-04 DIAGNOSIS — R29898 Other symptoms and signs involving the musculoskeletal system: Secondary | ICD-10-CM

## 2013-10-04 DIAGNOSIS — E119 Type 2 diabetes mellitus without complications: Secondary | ICD-10-CM

## 2013-10-04 LAB — COMPREHENSIVE METABOLIC PANEL WITH GFR
ALT: 111 U/L — ABNORMAL HIGH (ref 0–35)
AST: 51 U/L — ABNORMAL HIGH (ref 0–37)
Albumin: 4.2 g/dL (ref 3.5–5.2)
Alkaline Phosphatase: 118 U/L — ABNORMAL HIGH (ref 39–117)
BUN: 9 mg/dL (ref 6–23)
CO2: 23 meq/L (ref 19–32)
Calcium: 9.6 mg/dL (ref 8.4–10.5)
Chloride: 98 meq/L (ref 96–112)
Creatinine, Ser: 0.7 mg/dL (ref 0.4–1.2)
GFR: 104.78 mL/min
Glucose, Bld: 237 mg/dL — ABNORMAL HIGH (ref 70–99)
Potassium: 3.8 meq/L (ref 3.5–5.1)
Sodium: 132 meq/L — ABNORMAL LOW (ref 135–145)
Total Bilirubin: 0.5 mg/dL (ref 0.3–1.2)
Total Protein: 7.4 g/dL (ref 6.0–8.3)

## 2013-10-04 LAB — URINALYSIS, ROUTINE W REFLEX MICROSCOPIC
Bilirubin Urine: NEGATIVE
Hgb urine dipstick: NEGATIVE
Ketones, ur: NEGATIVE
Leukocytes, UA: NEGATIVE
Nitrite: NEGATIVE
RBC / HPF: NONE SEEN (ref 0–?)
Specific Gravity, Urine: 1.02 (ref 1.000–1.030)
Total Protein, Urine: NEGATIVE
Urine Glucose: >=1000 — AB
Urobilinogen, UA: 0.2 (ref 0.0–1.0)
pH: 6 (ref 5.0–8.0)

## 2013-10-04 LAB — LIPID PANEL
Cholesterol: 221 mg/dL — ABNORMAL HIGH (ref 0–200)
HDL: 33 mg/dL — ABNORMAL LOW
LDL Cholesterol: 110 mg/dL — ABNORMAL HIGH (ref 0–99)
Total CHOL/HDL Ratio: 7
Triglycerides: 389 mg/dL — ABNORMAL HIGH (ref 0.0–149.0)
VLDL: 77.8 mg/dL — ABNORMAL HIGH (ref 0.0–40.0)

## 2013-10-04 LAB — CBC WITH DIFFERENTIAL/PLATELET
Basophils Absolute: 0 10*3/uL (ref 0.0–0.1)
Basophils Relative: 0.5 % (ref 0.0–3.0)
Eosinophils Absolute: 0.1 10*3/uL (ref 0.0–0.7)
Eosinophils Relative: 0.8 % (ref 0.0–5.0)
HCT: 36 % (ref 36.0–46.0)
Hemoglobin: 12.1 g/dL (ref 12.0–15.0)
Lymphocytes Relative: 24.5 % (ref 12.0–46.0)
Lymphs Abs: 2.4 10*3/uL (ref 0.7–4.0)
MCHC: 33.7 g/dL (ref 30.0–36.0)
MCV: 90.8 fl (ref 78.0–100.0)
Monocytes Absolute: 0.4 10*3/uL (ref 0.1–1.0)
Monocytes Relative: 4 % (ref 3.0–12.0)
Neutro Abs: 6.8 10*3/uL (ref 1.4–7.7)
Neutrophils Relative %: 70.2 % (ref 43.0–77.0)
Platelets: 283 10*3/uL (ref 150.0–400.0)
RBC: 3.96 Mil/uL (ref 3.87–5.11)
RDW: 13 % (ref 11.5–14.6)
WBC: 9.7 10*3/uL (ref 4.5–10.5)

## 2013-10-04 LAB — CK: Total CK: 39 U/L (ref 7–177)

## 2013-10-04 LAB — GLUCOSE, POCT (MANUAL RESULT ENTRY): POC Glucose: 270 mg/dl — AB (ref 70–99)

## 2013-10-04 LAB — TSH: TSH: 1.1 u[IU]/mL (ref 0.35–5.50)

## 2013-10-04 LAB — POCT URINE PREGNANCY: Preg Test, Ur: NEGATIVE

## 2013-10-04 LAB — T4, FREE: Free T4: 0.63 ng/dL (ref 0.60–1.60)

## 2013-10-04 LAB — HEMOGLOBIN A1C: Hgb A1c MFr Bld: 9.1 % — ABNORMAL HIGH (ref 4.6–6.5)

## 2013-10-04 MED ORDER — METFORMIN HCL 500 MG PO TABS: ORAL_TABLET | ORAL | Status: DC

## 2013-10-04 NOTE — Patient Instructions (Signed)
Our office will call you with lab & results. If abdominal test is normal, we will consider back xrays. Please start metformin as prescribed today. Cut out sugar. Please check fasting blood sugars-at least 3 different occasions after you increase metformin to twice daily. Bring with you to next appt. Call my office if under 70. I will see you in 2 weeks or sooner if needed. Please let me know if you develop bowel or bladder incontinence.  Blood Glucose Monitoring, Adult Monitoring your blood glucose (also know as blood sugar) helps you to manage your diabetes. It also helps you and your health care provider monitor your diabetes and determine how well your treatment plan is working. WHY SHOULD YOU MONITOR YOUR BLOOD GLUCOSE?  It can help you understand how food, exercise, and medicine affect your blood glucose.  It allows you to know what your blood glucose is at any given moment. You can quickly tell if you are having low blood glucose (hypoglycemia) or high blood glucose (hyperglycemia).  It can help you and your health care provider know how to adjust your medicines.  It can help you understand how to manage an illness or adjust medicine for exercise. WHEN SHOULD YOU TEST? Your health care provider will help you decide how often you should check your blood glucose. This may depend on the type of diabetes you have, your diabetes control, or the types of medicines you are taking. Be sure to write down all of your blood glucose readings so that this information can be reviewed with your health care provider. See below for examples of testing times that your health care provider may suggest. Type 1 Diabetes  Test 4 times a day if you are in good control, using an insulin pump, or perform multiple daily injections.  If your diabetes is not well-controlled or if you are sick, you may need to monitor more often.  It is a good idea to also monitor:  Before and after exercise.  Between meals and 2  hours after a meal.  Occasionally between 2:00 to 3:00 am. Type 2 Diabetes  It can vary with each person, but generally, if you are on insulin, test 4 times a day.  If you take medicines by mouth (orally), test 2 times a day.  If you are on a controlled diet, test once a day.  If your diabetes is not well controlled or if you are sick, you may need to monitor more often. HOW TO MONITOR YOUR BLOOD GLUCOSE Supplies Needed  Blood glucose meter.  Test strips for your meter. Each meter has its own strips. You must use the strips that go with your own meter.  A pricking needle (lancet).  A device that holds the lancet (lancing device).  A journal or log book to write down your results. Procedure  Wash your hands with soap and water. Alcohol is not preferred.  Prick the side of your finger (not the tip) with the lancet.  Gently milk the finger until a small drop of blood appears.  Follow the instructions that come with your meter for inserting the test strip, applying blood to the strip, and using your blood glucose meter. Other Areas to Get Blood for Testing Some meters allow you to use other areas of your body (other than your finger) to test your blood. These areas are called alternative sites. The most common alternative sites are:  The forearm.  The thigh.  The back area of the lower leg.  The palm of the hand. The blood flow in these areas is slower. Therefore, the blood glucose values you get may be delayed, and the numbers are different from what you would get from your fingers. Do not use alternative sites if you think you are having hypoglycemia. Your reading will not be accurate. Always use a finger if you are having hypoglycemia. Also, if you cannot feel your lows (hypoglycemia unawareness), always use your fingers for your blood glucose checks. ADDITIONAL TIPS FOR GLUCOSE MONITORING  Do not reuse lancets.  Always carry your supplies with you.  All blood glucose  meters have a 24-hour "hotline" number to call if you have questions or need help.  Adjust (calibrate) your blood glucose meter with a control solution after finishing a few boxes of strips. BLOOD GLUCOSE RECORD KEEPING It is a good idea to keep a daily record or log of your blood glucose readings. Most glucose meters, if not all, keep your glucose records stored in the meter. Some meters come with the ability to download your records to your home computer. Keeping a record of your blood glucose readings is especially helpful if you are wanting to look for patterns. Make notes to go along with the blood glucose readings because you might forget what happened at that exact time. Keeping good records helps you and your health care provider to work together to achieve good diabetes management.  Document Released: 07/10/2003 Document Revised: 03/09/2013 Document Reviewed: 11/29/2012 [facility] Patient Information 2014 [place], [place].

## 2013-10-04 NOTE — Telephone Encounter (Signed)
Elevated liver enzymes, abd distended on exam. Abd US pending. Triglycerides high. A1C 9.2 Sodium low Free T4 low normal. No anemia. LM to discuss w/pt.

## 2013-10-05 ENCOUNTER — Telehealth: Payer: Self-pay | Admitting: Nurse Practitioner

## 2013-10-05 ENCOUNTER — Ambulatory Visit (HOSPITAL_COMMUNITY)
Admission: RE | Admit: 2013-10-05 | Discharge: 2013-10-05 | Disposition: A | Discharge status: Home / Self Care | Payer: BC Managed Care – PPO | Source: Ambulatory Visit | Attending: Nurse Practitioner | Admitting: Nurse Practitioner

## 2013-10-05 ENCOUNTER — Ambulatory Visit: Payer: BC Managed Care – PPO

## 2013-10-05 DIAGNOSIS — R748 Abnormal levels of other serum enzymes: Secondary | ICD-10-CM

## 2013-10-05 DIAGNOSIS — R142 Eructation: Secondary | ICD-10-CM | POA: Insufficient documentation

## 2013-10-05 DIAGNOSIS — E871 Hypo-osmolality and hyponatremia: Secondary | ICD-10-CM

## 2013-10-05 DIAGNOSIS — R141 Gas pain: Secondary | ICD-10-CM | POA: Insufficient documentation

## 2013-10-05 DIAGNOSIS — R140 Abdominal distension (gaseous): Secondary | ICD-10-CM

## 2013-10-05 DIAGNOSIS — R143 Flatulence: Principal | ICD-10-CM

## 2013-10-05 DIAGNOSIS — E119 Type 2 diabetes mellitus without complications: Secondary | ICD-10-CM | POA: Insufficient documentation

## 2013-10-05 LAB — MAGNESIUM: Magnesium: 1.7 mg/dL (ref 1.5–2.5)

## 2013-10-05 NOTE — Telephone Encounter (Signed)
Selena BraunKaren from [facility] called she said she is able to add a magnesium to pt's lab but she is unable to add the Hep B or Hep C. She doesn't have the correct tube.

## 2013-10-05 NOTE — Telephone Encounter (Signed)
Mg low normal. Recmd pt start 125 mg OTC magnesium daily. Abd US shows fatty liver & possible angiomyolipoma on r kidney- 9 mm. 40% symptomatic: hematuria, flank pain. These are usually >4 cm. I will check liver enzymes again in 6 weeks, add viral hep screen, do ua. Will ref to GI if liver enzymes still elevated. Discussed all w/pt. Answered all questions.

## 2013-10-05 NOTE — Telephone Encounter (Signed)
A1C 9.6 from 9.8: Pt should continue with metformin. Encouraged cut out sugar, take a walk after largest meal. Exercise goal: 30 minutes daily. Elevated triglyceride: cut out sugar & white flour. Elevated liver enzymes: will screen for viral Hep. Pt not taking acetaminophen, does not drink ETOH. States liver enzymes were elevated in past when she took depakote, had to d/c med. Waiting for Abd US results. No anemia, had in past. Will monitor in 6 mos. Thyroid low nml. Will monitor. Sodium low-may be causing leg cramps. Pt states not restricting salt, denies ETOH. DD: liver disease, renal disease (elevated AST/ALT/BUN Creat)?  CK nml-not r/t leg pain/heaviness Discussed all w/pt. OV in 2 weeks.

## 2013-10-05 NOTE — Telephone Encounter (Signed)
Add-on lab request faxed to lab.

## 2013-10-06 ENCOUNTER — Encounter: Payer: Self-pay | Admitting: Nurse Practitioner

## 2013-10-06 NOTE — Assessment & Plan Note (Addendum)
Distension on exam, mild tenderness to deep palpation at RUQ. LUQ & LLQ feel full-stool? No HSM, abd is diffusely distended, as if 5 mos pregnant. Has mirena. POC urine preg-Neg. Abd US. No Nausea or diarrhea. Reports a BM qod. Has Hx of no BM for 2 mos.

## 2013-10-06 NOTE — Progress Notes (Signed)
Subjective:     Selena Holt is a 43 y.o. female she wishes to establish care. She presents with c/o not feeling well for the past week: leg cramps at night, LE heaviness, polydipsia, polyphagia, lightheadedness, night sweats, & HA for a few days relieved by ibuprophen. She was diagnosed with diabetes nearly 1 year ago. She has not taken meds for DM for more than 6 mos. due to intolerable SE. She has not had f/u with historical provider. She also has historical diagnosis of bipolar disorder, treated by psychiatry (Dr Nolen MuMcKinney). She stopped medications over 6 mos ago because she didn't feel she needed them. Historical meds: zoloft, lamictal, & abilify. She receives woman care from gynecology: last pap 2012 reports no normals.  The following portions of the patient's history were reviewed and updated as appropriate: allergies, current medications, past family history, past medical history, past social history, past surgical history and problem list.  Review of Systems Constitutional: positive for night sweats, negative for chills and fevers Eyes: negative for contacts/glasses, visual disturbance and no recent eye exam Respiratory: negative for cough and dyspnea on exertion Cardiovascular: negative for chest pain, chest pressure/discomfort, irregular heart beat and lower extremity edema Gastrointestinal: negative for abdominal pain, constipation, diarrhea, nausea and has Hx of 2 mos sever constipation w/no BM Genitourinary:negative, not having MC, has mirena Musculoskeletal:positive for muscle weakness and scoliosis, negative for arthralgias, back pain, myalgias and stiff joints Neurological: positive for dizziness, headaches and weakness, negative for coordination problems, memory problems, paresthesia, seizures and tremors Behavioral/Psych: positive for Hx of bipolar diagnosis, negative for excessive alcohol consumption, illegal drug usage and tobacco use Endocrine: positive for diabetic symptoms  including polydipsia and polyuria, negative for diabetic symptoms including blurry vision and polyphagia, fertility problems and temperature intolerance    Objective:    BP 137/85  Pulse 91  Temp(Src) 98.2 F (36.8 C) (Oral)  Resp 18  Ht 5\' 5"  (1.651 m)  Wt 153 lb (69.4 kg)  BMI 25.46 kg/m2  SpO2 98% BP 137/85  Pulse 91  Temp(Src) 98.2 F (36.8 C) (Oral)  Resp 18  Ht 5\' 5"  (1.651 m)  Wt 153 lb (69.4 kg)  BMI 25.46 kg/m2  SpO2 98% General appearance: alert, cooperative, appears stated age and no distress Head: Normocephalic, without obvious abnormality, atraumatic, cushingoid face, trunkal obesity Eyes: negative findings: lids and lashes normal, conjunctivae and sclerae normal, corneas clear and pupils equal, round, reactive to light and accomodation Throat: lips, mucosa, and tongue normal; teeth and gums normal Neck: no adenopathy, no carotid bruit, supple, symmetrical, trachea midline and thyroid not enlarged, symmetric, no tenderness/mass/nodules Back: kyphoscoliosis, no tenderness to palpation Lungs: clear to auscultation bilaterally Heart: regular rate and rhythm, S1, S2 normal, no murmur, click, rub or gallop Abdomen: normal findings: no bruits heard, no masses palpable, no organomegaly and symmetric and abnormal findings:  distended and trunkal obesity, mild tenderness to deep palpation RUQ  Extremities: strength equal LE 4/5 Pulses: 2+ and symmetric Lymph nodes: Cervical, supraclavicular, and axillary nodes normal.    Assessment:     1. Diabetes   2. Abdominal distension   3. Lower extremity weakness   4. Type II or unspecified type diabetes mellitus without mention of complication, uncontrolled   5. Bipolar disorder         Plan:     See problem list for complete A&P

## 2013-10-06 NOTE — Assessment & Plan Note (Addendum)
Currently on no meds. She reports symptoms of "dizzy spells", polyuria, poly dipsia, leg cramps, night sweats, in general not feeling well.  She has cushingoid appearance without Hx of exogenous steroids. Cushings? She is motivated to make lifestyle changes: diet, exercise, & take medications. POC glucose 270. A1C, CBC, urine micro, lipids, CMET, TSH today. Start 500 mg metformin qd C 3d, then bid.  Check FBS at home, call if under 70. Bring numbers to next visit. Discussed cutting out sugar & white flour, taking a walk after largest meal of day.  Gave written info re: S&S of high & low sugar, how to read labels, food choices when eating out, & overview of DM. F/u in 2 weeks.

## 2013-10-06 NOTE — Assessment & Plan Note (Addendum)
Weakness by pt report. Exam is normal for strength. Pt reports "heavy feeling in legs". LE cramps at night.  Mg, CMET, TSH, CK, CBC today.

## 2013-10-11 ENCOUNTER — Other Ambulatory Visit: Payer: Self-pay | Admitting: Obstetrics and Gynecology

## 2013-10-14 ENCOUNTER — Encounter: Payer: Self-pay | Admitting: Nurse Practitioner

## 2013-10-14 ENCOUNTER — Ambulatory Visit (INDEPENDENT_AMBULATORY_CARE_PROVIDER_SITE_OTHER): Payer: BC Managed Care – PPO | Admitting: Nurse Practitioner

## 2013-10-14 VITALS — BP 124/74 | HR 96 | Temp 97.9°F | Ht 65.0 in | Wt 154.5 lb

## 2013-10-14 DIAGNOSIS — R143 Flatulence: Secondary | ICD-10-CM

## 2013-10-14 DIAGNOSIS — E1165 Type 2 diabetes mellitus with hyperglycemia: Secondary | ICD-10-CM

## 2013-10-14 DIAGNOSIS — J029 Acute pharyngitis, unspecified: Secondary | ICD-10-CM

## 2013-10-14 DIAGNOSIS — R141 Gas pain: Secondary | ICD-10-CM

## 2013-10-14 DIAGNOSIS — R140 Abdominal distension (gaseous): Secondary | ICD-10-CM

## 2013-10-14 DIAGNOSIS — R142 Eructation: Secondary | ICD-10-CM

## 2013-10-14 DIAGNOSIS — R748 Abnormal levels of other serum enzymes: Secondary | ICD-10-CM

## 2013-10-14 DIAGNOSIS — R29898 Other symptoms and signs involving the musculoskeletal system: Secondary | ICD-10-CM

## 2013-10-14 DIAGNOSIS — IMO0001 Reserved for inherently not codable concepts without codable children: Secondary | ICD-10-CM

## 2013-10-14 LAB — POCT RAPID STREP A (OFFICE): Rapid Strep A Screen: NEGATIVE

## 2013-10-14 LAB — CBC
HCT: 34.3 % — ABNORMAL LOW (ref 36.0–46.0)
Hemoglobin: 11.5 g/dL — ABNORMAL LOW (ref 12.0–15.0)
MCHC: 33.6 g/dL (ref 30.0–36.0)
MCV: 91.4 fl (ref 78.0–100.0)
Platelets: 246 10*3/uL (ref 150.0–400.0)
RBC: 3.75 Mil/uL — ABNORMAL LOW (ref 3.87–5.11)
RDW: 13.6 % (ref 11.5–14.6)
WBC: 9 10*3/uL (ref 4.5–10.5)

## 2013-10-14 NOTE — Progress Notes (Signed)
Pre visit review using our clinic review tool, if applicable. No additional management support is needed unless otherwise documented below in the visit note. 

## 2013-10-14 NOTE — Patient Instructions (Signed)
Please start daily sinus rinses (Neilmed sinus rinse). Continue daily through end of May. You may continue claritin daily through May also.  If throat culture positive, we will call you & start antibiotic.  Increase metformin to twice daily. If diarrhea starts again, take twice daily every other day, if diarrhea still persistent, take twice every 3rd day (continuing to always take it once daily). The gas should get better, but it may take several weeks. I will see you in about 1 month or sooner if allergiy symptoms do not improve.  Allergic Rhinitis Allergic rhinitis is when the mucous membranes in the nose respond to allergens. Allergens are particles in the air that cause your body to have an allergic reaction. This causes you to release allergic antibodies. Through a chain of events, these eventually cause you to release histamine into the blood stream. Although meant to protect the body, it is this release of histamine that causes your discomfort, such as frequent sneezing, congestion, and an itchy, runny nose.  CAUSES  Seasonal allergic rhinitis (hay fever) is caused by pollen allergens that may come from grasses, trees, and weeds. Year-round allergic rhinitis (perennial allergic rhinitis) is caused by allergens such as house dust mites, pet dander, and mold spores.  SYMPTOMS   Nasal stuffiness (congestion).  Itchy, runny nose with sneezing and tearing of the eyes. DIAGNOSIS  Your health care provider can help you determine the allergen or allergens that trigger your symptoms. If you and your health care provider are unable to determine the allergen, skin or blood testing may be used. TREATMENT  Allergic Rhinitis does not have a cure, but it can be controlled by:  Medicines and allergy shots (immunotherapy).  Avoiding the allergen. Hay fever may often be treated with antihistamines in pill or nasal spray forms. Antihistamines block the effects of histamine. There are over-the-counter  medicines that may help with nasal congestion and swelling around the eyes. Check with your health care provider before taking or giving this medicine.  If avoiding the allergen or the medicine prescribed do not work, there are many new medicines your health care provider can prescribe. Stronger medicine may be used if initial measures are ineffective. Desensitizing injections can be used if medicine and avoidance does not work. Desensitization is when a patient is given ongoing shots until the body becomes less sensitive to the allergen. Make sure you follow up with your health care provider if problems continue. HOME CARE INSTRUCTIONS It is not possible to completely avoid allergens, but you can reduce your symptoms by taking steps to limit your exposure to them. It helps to know exactly what you are allergic to so that you can avoid your specific triggers. SEEK MEDICAL CARE IF:   You have a fever.  You develop a cough that does not stop easily (persistent).  You have shortness of breath.  You start wheezing.  Symptoms interfere with normal daily activities. Document Released: 04/01/2001 Document Revised: 04/27/2013 Document Reviewed: 03/14/2013 [facility] Patient Information 2014 [place], [place].  Diabetes and Exercise Exercising regularly is important. It is not just about losing weight. It has many health benefits, such as:  Improving your overall fitness, flexibility, and endurance.  Increasing your bone density.  Helping with weight control.  Decreasing your body fat.  Increasing your muscle strength.  Reducing stress and tension.  Improving your overall health. People with diabetes who exercise gain additional benefits because exercise:  Reduces appetite.  Improves the body's use of blood sugar (glucose).  Helps  lower or control blood glucose.  Decreases blood pressure.  Helps control blood lipids (such as cholesterol and triglycerides).  Improves the body's use of  the hormone insulin by:  Increasing the body's insulin sensitivity.  Reducing the body's insulin needs.  Decreases the risk for heart disease because exercising:  Lowers cholesterol and triglycerides levels.  Increases the levels of good cholesterol (such as high-density lipoproteins [HDL]) in the body.  Lowers blood glucose levels. YOUR ACTIVITY PLAN  Choose an activity that you enjoy and set realistic goals. Your health care provider or diabetes educator can help you make an activity plan that works for you. You can break activities into 2 or 3 sessions throughout the day. Doing so is as good as one long session. Exercise ideas include:  Taking the dog for a walk.  Taking the stairs instead of the elevator.  Dancing to your favorite song.  Doing your favorite exercise with a friend. RECOMMENDATIONS FOR EXERCISING WITH TYPE 1 OR TYPE 2 DIABETES   Check your blood glucose before exercising. If blood glucose levels are greater than 240 mg/dL, check for urine ketones. Do not exercise if ketones are present.  Avoid injecting insulin into areas of the body that are going to be exercised. For example, avoid injecting insulin into:  The arms when playing tennis.  The legs when jogging.  Keep a record of:  Food intake before and after you exercise.  Expected peak times of insulin action.  Blood glucose levels before and after you exercise.  The type and amount of exercise you have done.  Review your records with your health care provider. Your health care provider will help you to develop guidelines for adjusting food intake and insulin amounts before and after exercising.  If you take insulin or oral hypoglycemic agents, watch for signs and symptoms of hypoglycemia. They include:  Dizziness.  Shaking.  Sweating.  Chills.  Confusion.  Drink plenty of water while you exercise to prevent dehydration or heat stroke. Body water is lost during exercise and must be  replaced.  Talk to your health care provider before starting an exercise program to make sure it is safe for you. Remember, almost any type of activity is better than none. Document Released: 09/27/2003 Document Revised: 03/09/2013 Document Reviewed: 12/14/2012 [facility] Patient Information 2014 [place]

## 2013-10-15 LAB — VITAMIN D 25 HYDROXY (VIT D DEFICIENCY, FRACTURES): Vit D, 25-Hydroxy: 26 ng/mL — ABNORMAL LOW (ref 30–89)

## 2013-10-15 LAB — HEPATITIS C ANTIBODY: HCV Ab: NEGATIVE

## 2013-10-15 LAB — HEPATITIS B CORE ANTIBODY, TOTAL: Hep B Core Total Ab: NONREACTIVE

## 2013-10-17 ENCOUNTER — Telehealth: Payer: Self-pay

## 2013-10-17 DIAGNOSIS — R748 Abnormal levels of other serum enzymes: Secondary | ICD-10-CM | POA: Insufficient documentation

## 2013-10-17 DIAGNOSIS — B001 Herpesviral vesicular dermatitis: Secondary | ICD-10-CM

## 2013-10-17 DIAGNOSIS — J111 Influenza due to unidentified influenza virus with other respiratory manifestations: Secondary | ICD-10-CM

## 2013-10-17 DIAGNOSIS — E559 Vitamin D deficiency, unspecified: Secondary | ICD-10-CM

## 2013-10-17 MED ORDER — OSELTAMIVIR PHOSPHATE 75 MG PO CAPS: 75.0000 mg | ORAL_CAPSULE | Freq: Two times a day (BID) | ORAL | Status: DC

## 2013-10-17 MED ORDER — VITAMIN D3 50 MCG (2000 UT) PO CAPS: 2000.0000 [IU] | ORAL_CAPSULE | Freq: Every day | ORAL | Status: DC

## 2013-10-17 MED ORDER — LYSINE 500 MG PO CAPS: ORAL_CAPSULE | ORAL | Status: DC

## 2013-10-17 NOTE — Telephone Encounter (Signed)
Spoke with pt, she now has fever, nasal swelling, and has developed a fever blister. I check to see if her throat culture came back but it is still in process. Pt has also lost her voice and its getting worse. Please advise.

## 2013-10-17 NOTE — Assessment & Plan Note (Signed)
Steady increase in ALT: 12 mo ago-37, 11 mo ago 73, most recent 111. Denies heavy ETOH or tylenol use. Stopped all psych meds several mos ago. Recheck liver enzymes 6-8 weeks. Added viral Hep screen today.

## 2013-10-17 NOTE — Assessment & Plan Note (Signed)
Current meds: metformin 500 mg bid. Pt not tolerating twice daily dose due to diarrhea & flatulence. Taking qd. FBS checked 1 time=179. Improved from 237. Goal >60<130. Will continue metformin qd, bid qod if tolerates. Don't want to start sulfonylurea with elevated liver enzymes, unk cause. Will f/u 3 mos.

## 2013-10-17 NOTE — Telephone Encounter (Signed)
Pt began running fever 101 oral 2 da. Still has fever today, voice loss, scratchy throat, runny nose. No cough. Chest discomfort has improved since in ofc. Feels fatigued. Will treat for flu. CBC -nml WBC. Also has fever blister-recmd lysine. recmd vit D supplement for d deficiency. Discussed viral hep screen-all neg. Not sure why liver enzymes elevated. Will monitor-check again in 6 weeks.

## 2013-10-17 NOTE — Progress Notes (Signed)
Subjective:     Selena Holt is a 43 y.o. female presents w/c/o 3 days sore throat, nasal congestion, posterior & anterior chest ache-under sternum & b/w shoulder blades. She states her chest always feels this way when she gets sick. She denies cough, SOB, fever, abd pain, N&V, HA, fatigue. DM management reviewed as just started metformin 500 mg bid. Pt started having diarrhea when added second daily dose, so she cut back to once daily. She tolerates once daily dosing without diarrhea, but is having considerable gas. FBS have improved to 179 from high 270's. Discussed Goal of 60-130.  She states leg weakness & cramping has resolved with using additional salt. She tried magnesium 125 mg, but she felt that her heart was beating "hard"-woke her from sleep.   The following portions of the patient's history were reviewed and updated as appropriate: allergies, current medications, past medical history, past surgical history and problem list.  Review of Systems Constitutional: negative Ears, nose, mouth, throat, and face: positive for nasal congestion and sore throat, negative for earaches, hoarseness and cough or HA Respiratory: negative for cough and pos for chest achiness Cardiovascular: negative for chest pressure/discomfort, dyspnea, exertional chest pressure/discomfort, irregular heart beat and lower extremity edema Gastrointestinal: denies N&V. diarrhea assoc w/ taking metformin twice daily Musculoskeletal:leg weakness & cramps resolved Neurological: negative Endocrine: negative for diabetic symptoms including polydipsia, polyphagia and polyuria Allergic/Immunologic: positive for seas allergies    Objective:    BP 124/74  Pulse 96  Temp(Src) 97.9 F (36.6 C) (Oral)  Ht 5\' 5"  (1.651 m)  Wt 154 lb 8 oz (70.081 kg)  BMI 25.71 kg/m2  SpO2 98% BP 124/74  Pulse 96  Temp(Src) 97.9 F (36.6 C) (Oral)  Ht 5\' 5"  (1.651 m)  Wt 154 lb 8 oz (70.081 kg)  BMI 25.71 kg/m2  SpO2 98% General  appearance: alert, cooperative, appears stated age and no distress Head: Normocephalic, without obvious abnormality, atraumatic Eyes: negative findings: lids and lashes normal and conjunctivae and sclerae normal Ears: bilat effusions TM, bones visible Throat: lips, mucosa, and tongue normal; teeth and gums normal Back: kyphosis, scoliosis. Unable to reproduce pain b/w shoulder blades or along ribs. Lungs: clear to auscultation bilaterally Heart: regular rate and rhythm, S1, S2 normal, no murmur, click, rub or gallop Abdomen: mildly distended, less distension than last OV-perhaps was stool as metformin has caused diarrhea. Persistent mild tenderness to deep palpation RUQ. Lymph nodes: enlarged anterior tonsillar nodes, NT. Palpated over sternum-unable to palpate thymus gland    Assessment:        1. Sore throat Likely viral, nasal congestion. - POCT rapid strep A-NEG - Upper Respiratory Culture-pending - CBC 2. Elevated liver enzymes unk etio - Hepatitis B Core Antibody, total - Hepatitis C antibody 3. Type II or unspecified type diabetes mellitus without mention of complication, uncontrolled 4. Lower extremity weakness 5. Abdominal distension     Plan:     See prob list for complete A&P F/U for sore throat PRN F/u for elevated liver enzymes 6 qweeks

## 2013-10-17 NOTE — Assessment & Plan Note (Signed)
No findings on US to explain: possible mild fatty infiltration. 9mm hyperechoic focus in the upper pole right kidney may reflect angiomyolipoma- corresponds to a subtle hypodensity on CT scan January 30, 2006. Continues to have abd distension on exam W/ mild tenderness RUQ to deep palpation.

## 2013-10-18 ENCOUNTER — Ambulatory Visit: Payer: BC Managed Care – PPO | Admitting: Nurse Practitioner

## 2013-10-18 LAB — CULTURE, UPPER RESPIRATORY: Organism ID, Bacteria: NORMAL

## 2013-10-27 ENCOUNTER — Telehealth: Payer: Self-pay

## 2013-10-27 NOTE — Telephone Encounter (Signed)
Relevant patient education assigned to patient using Emmi. ° °

## 2013-11-01 ENCOUNTER — Telehealth: Payer: Self-pay | Admitting: Nurse Practitioner

## 2013-11-01 NOTE — Telephone Encounter (Signed)
Patient was bit by a tick Sat 10/29/13 on the back of her leg. She has a rash that comes & goes. Please contact patient.

## 2013-11-01 NOTE — Telephone Encounter (Signed)
Patient wanted to email a picture of tick bite to find out if she needs to be seen. Advised pt that she should schedule an appt.

## 2013-11-01 NOTE — Telephone Encounter (Signed)
Pt advised to make sure there is no part of the tick still left in the bite area. Pt should clean area with soap and water. Pt should use neosporin on bite and surrounding area. Pt should make ov appt if there are any changes such as rash,or fever. Per Layne.

## 2013-11-04 ENCOUNTER — Encounter: Payer: Self-pay | Admitting: Nurse Practitioner

## 2013-11-04 ENCOUNTER — Ambulatory Visit (INDEPENDENT_AMBULATORY_CARE_PROVIDER_SITE_OTHER): Payer: BC Managed Care – PPO | Admitting: Nurse Practitioner

## 2013-11-04 VITALS — BP 120/82 | HR 91 | Temp 98.0°F | Resp 18 | Ht 65.0 in | Wt 152.0 lb

## 2013-11-04 DIAGNOSIS — L049 Acute lymphadenitis, unspecified: Secondary | ICD-10-CM

## 2013-11-04 MED ORDER — DOXYCYCLINE HYCLATE 100 MG PO TABS: 100.0000 mg | ORAL_TABLET | Freq: Two times a day (BID) | ORAL | Status: DC

## 2013-11-04 NOTE — Patient Instructions (Signed)
Start antibiotic. Suck sour sugar free candy 5-6 times daily. You may use heating pad to area several times daily. Follow up next week.  Parotitis Parotitis is soreness and swelling (inflammation) of one or both parotid glands. The parotid glands produce saliva. They are located on each side of the face, below and in front of the earlobes. The saliva produced comes out of tiny openings (ducts) inside the cheeks. In most cases, parotitis goes away over time or with treatment. If your parotitis is caused by certain long-term (chronic) diseases, it may come back again.  CAUSES  Parotitis can be caused by:  Viral infections. Mumps is one viral infection that can cause parotitis.  Bacterial infections.  Blockage of the salivary ducts due to a salivary stone.  Narrowing of the salivary ducts.  Swelling of the salivary ducts.  Dehydration.  Autoimmune conditions, such as sarcoidosis or Sjogren's syndrome.  Air from activities such as scuba diving, glass blowing, or playing an instrument (rare).  Human immunodeficiency virus (HIV) or acquired immunodeficiency syndrome (AIDS).  Tuberculosis. SYMPTOMS   The ears may appear to be pushed up and out from their normal position.  Redness (erythema) of the skin over the parotid glands.  Pain and tenderness over the parotid glands.  Swelling in the parotid gland area.  Yellowish-white fluid (pus) coming from the ducts inside the cheeks.  Dry mouth.  Bad taste in the mouth. DIAGNOSIS  Your caregiver may determine that you have parotitis based on your symptoms and a physical exam. A sample of fluid may also be taken from the parotid gland and tested to find the cause of your infection. X-rays or computed tomography (CT) scans may be taken if your caregiver thinks you might have a salivary stone blocking your salivary duct. TREATMENT  Treatment varies depending upon the cause of your parotitis. If your parotitis is caused by mumps, no  treatment is needed. The condition will go away on its own after 7 to 10 days. In other cases, treatment may include:  Antibiotics if your infection was caused by bacteria.  Pain medicines.  Gland massage.  Eating sour candy to increase your saliva production.  Removal of salivary stones. Your caregiver may flush stones out with fluids or remove them with tweezers.  Surgery to remove the parotid glands. HOME CARE INSTRUCTIONS   If you were given antibiotics, take them as directed. Finish them even if you start to feel better.  Put warm compresses on the sore area.  Only take over-the-counter or prescription medicines for pain, discomfort, or fever as directed by your caregiver.  Drink enough fluids to keep your urine clear or pale yellow. SEEK IMMEDIATE MEDICAL CARE IF:   You have increasing pain or swelling that is not controlled with medicine.  You have a fever. MAKE SURE YOU:  Understand these instructions.  Will watch your condition.  Will get help right away if you are not doing well or get worse. Document Released: 12/27/2001 Document Revised: 09/29/2011 Document Reviewed: 06/02/2011 [facility] Patient Information 2014 [place], [place].

## 2013-11-08 DIAGNOSIS — L049 Acute lymphadenitis, unspecified: Secondary | ICD-10-CM | POA: Insufficient documentation

## 2013-11-08 NOTE — Progress Notes (Signed)
Subjective:     Selena Holt is a 43 y.o. female and  is c/o R jaw pain. She was treated for viral illness about 3 weeks ago. Jaw pain started a few days ago. Pain low to  Moderate. Lying on L side makes pain worse. She denies SOB, cough, clicking or crepitus with opening & closing mouth, trouble swallowing. She states neck feels swollen and R ear feels "weird". Regarding diabetes, she continues to take metformin 500 mg once daily. She has not increased to twice daily due to diarrhea. She is not checking blood sugars-she is needle phobic. I stressed importance of monitoring blood sugar at home to monitor progress of treatment to prevent diabetic complications.  History   Social History  . Marital Status: Single    Spouse Name: N/A    Number of Children: 2  . Years of Education: N/A   Occupational History  . clerical     [employer]   Social History Main Topics  . Smoking status: Never Smoker   . Smokeless tobacco: Not on file  . Alcohol Use: No     Comment: occ  . Drug Use: No  . Sexual Activity: Not on file   Other Topics Concern  . Not on file   Social History Narrative   Ms. Selena Holt [place] 2 [place]. 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   Health Maintenance  Topic Date Due  . Tetanus/tdap  07/11/1991  . Influenza Vaccine  02/18/2014  . Pap Smear  10/11/2016    The following portions of the patient's history were reviewed and updated as appropriate: allergies, current medications, past medical history, past social history, past surgical history and problem list.  Review of Systems Pertinent items are noted in HPI.   Objective:    BP 120/82  Pulse 91  Temp(Src) 98 F (36.7 C) (Oral)  Resp 18  Ht 5\' 5"  (1.651 m)  Wt 152 lb (68.947 kg)  BMI 25.29 kg/m2  SpO2 100% General appearance: alert, cooperative, appears stated age and no distress Head: Normocephalic, without obvious  abnormality, atraumatic Eyes: negative findings: lids and lashes normal and conjunctivae and sclerae normal Ears: NMl TM bilat, R canal slightly swollen, but no erythema or tenderness, L canal nml Throat: lips, mucosa, and tongue normal; teeth and gums normal Neck: supple, symmetrical, trachea midline and thyroid not enlarged, symmetric, no tenderness/mass/nodules Lungs: clear to auscultation bilaterally Heart: regular rate and rhythm, S1, S2 normal, no murmur, click, rub or gallop Skin: Skin color, texture, turgor normal. No rashes or lesions Lymph nodes: no auricular LAD, She has large anterior R cervical node or chain of nodes enlarged & tender-swollen area is about 3 cm.    Assessment & Plan:  1. Lymphadenitis, acute R anterior cervical LAD 2 weeks post respiratory illness - doxycycline (VIBRA-TABS) 100 MG tablet; Take 1 tablet (100 mg total) by mouth 2 (two) times daily.  Dispense: 10 tablet; Refill: 0 F/u 1 week.  2 DM Taking metformin 500 qd. Having diarrhea. Not checking bs at home. Allergic to sulfa meds. Stressed importance of good control to prevent diabetic complications.

## 2013-11-11 ENCOUNTER — Ambulatory Visit: Payer: BC Managed Care – PPO | Admitting: Nurse Practitioner

## 2013-11-17 ENCOUNTER — Encounter: Payer: Self-pay | Admitting: Nurse Practitioner

## 2014-01-19 ENCOUNTER — Ambulatory Visit (INDEPENDENT_AMBULATORY_CARE_PROVIDER_SITE_OTHER): Payer: BC Managed Care – PPO | Admitting: Family Medicine

## 2014-01-19 ENCOUNTER — Encounter: Payer: Self-pay | Admitting: Family Medicine

## 2014-01-19 VITALS — BP 123/84 | HR 90 | Temp 98.0°F | Resp 18 | Ht 65.0 in | Wt 152.0 lb

## 2014-01-19 DIAGNOSIS — S139XXA Sprain of joints and ligaments of unspecified parts of neck, initial encounter: Secondary | ICD-10-CM

## 2014-01-19 DIAGNOSIS — E119 Type 2 diabetes mellitus without complications: Secondary | ICD-10-CM

## 2014-01-19 DIAGNOSIS — S161XXA Strain of muscle, fascia and tendon at neck level, initial encounter: Secondary | ICD-10-CM

## 2014-01-19 MED ORDER — CYCLOBENZAPRINE HCL 10 MG PO TABS: 10.0000 mg | ORAL_TABLET | Freq: Three times a day (TID) | ORAL | Status: DC | PRN

## 2014-01-19 MED ORDER — TRAMADOL HCL 50 MG PO TABS: ORAL_TABLET | ORAL | Status: DC

## 2014-01-19 MED ORDER — METFORMIN HCL 500 MG PO TABS: ORAL_TABLET | ORAL | Status: DC

## 2014-01-19 NOTE — Progress Notes (Signed)
OFFICE NOTE  01/22/2014  CC:  Chief Complaint  Patient presents with  . Torticollis    x Monday, getting worse   HPI: Patient is a 43 y.o. Caucasian female who is here for neck pain. Onset right sided neck pain 4 d/a, getting worse.  The pain extends over trap/near scap, almost over to top of right shoulder.  No tingling or numbness.  NSAIDs, heat, cold, bengay have not affected it.  Turning to the right makes it hurt worse.  Has never had this before.  No new bed/pillow/sleeping position.  No trauma/strain recalled.  Pertinent PMH:  Past Medical History  Diagnosis Date  . Hypertension   . Bipolar disorder   . HSV-1 (herpes simplex virus 1) infection   . Diabetes mellitus without complication   . Collapsed lung     "spontaneous" , teenager  . Scoliosis (and kyphoscoliosis), idiopathic   . Anemia    No past surgical history on file.  MEDS:  Mirena IUD, Vit D, metformin 500 mg qd  PE: Blood pressure 123/84, pulse 90, temperature 98 F (36.7 C), temperature source Temporal, resp. rate 18, height 5\' 5"  (1.651 m), weight 152 lb (68.947 kg), SpO2 100.00%. Gen: Alert, well appearing.  Patient is oriented to person, place, time, and situation. Neck: ROM limited with head turn to R and lateral flexion to R due to pain in soft tissues of right side of neck.   No tenderness in neck or upper back or shoulders.  ROM of shoulders normal bilat.   Normal UE strength and sensation.  Spurling's testing neg bilat.    IMPRESSION AND PLAN:  Cervical strain Flexeril 10mg  bid prn, #20, no RF. Tramadol 50mg , 1-2 q6h prn, #30, no RF. PT referral ordered: [facility].   An After Visit Summary was printed and given to the patient.  FOLLOW UP: w/in 1 mo with Maximino SarinLayne Weaver, FNP for routine f/u of her DM 2.  Pt states she is currently taking her metformin 500 mg once a day and I gave a 30 d RF of this med for her today.

## 2014-01-19 NOTE — Progress Notes (Signed)
Pre visit review using our clinic review tool, if applicable. No additional management support is needed unless otherwise documented below in the visit note. 

## 2014-01-22 DIAGNOSIS — S161XXA Strain of muscle, fascia and tendon at neck level, initial encounter: Secondary | ICD-10-CM | POA: Insufficient documentation

## 2014-01-22 NOTE — Assessment & Plan Note (Signed)
Flexeril 10mg  bid prn, #20, no RF. Tramadol 50mg , 1-2 q6h prn, #30, no RF. PT referral ordered: [facility].

## 2014-01-27 ENCOUNTER — Encounter (HOSPITAL_COMMUNITY): Payer: Self-pay | Admitting: Emergency Medicine

## 2014-01-27 DIAGNOSIS — M62838 Other muscle spasm: Secondary | ICD-10-CM | POA: Insufficient documentation

## 2014-01-27 DIAGNOSIS — Z79899 Other long term (current) drug therapy: Secondary | ICD-10-CM | POA: Insufficient documentation

## 2014-01-27 DIAGNOSIS — E119 Type 2 diabetes mellitus without complications: Secondary | ICD-10-CM | POA: Insufficient documentation

## 2014-01-27 DIAGNOSIS — Z862 Personal history of diseases of the blood and blood-forming organs and certain disorders involving the immune mechanism: Secondary | ICD-10-CM | POA: Insufficient documentation

## 2014-01-27 DIAGNOSIS — I10 Essential (primary) hypertension: Secondary | ICD-10-CM | POA: Insufficient documentation

## 2014-01-27 DIAGNOSIS — Z880 Allergy status to penicillin: Secondary | ICD-10-CM | POA: Insufficient documentation

## 2014-01-27 DIAGNOSIS — Z8659 Personal history of other mental and behavioral disorders: Secondary | ICD-10-CM | POA: Insufficient documentation

## 2014-01-27 DIAGNOSIS — Z8619 Personal history of other infectious and parasitic diseases: Secondary | ICD-10-CM | POA: Insufficient documentation

## 2014-01-27 LAB — CBG MONITORING, ED: Glucose-Capillary: 200 mg/dL — ABNORMAL HIGH (ref 70–99)

## 2014-01-27 NOTE — ED Notes (Signed)
The pt has had a stiff neck on the rt side of her neck and pain in her shoulder since June 29th.  She was seen by her doctor July 2 and was told she had a sparin.  The med dhe was given has not helped and the pain has become worse.  lmp  None iud

## 2014-01-28 ENCOUNTER — Emergency Department (HOSPITAL_COMMUNITY)
Admission: EM | Admit: 2014-01-28 | Discharge: 2014-01-28 | Disposition: A | Discharge status: Home / Self Care | Payer: BC Managed Care – PPO | Attending: Emergency Medicine | Admitting: Emergency Medicine

## 2014-01-28 DIAGNOSIS — M62838 Other muscle spasm: Secondary | ICD-10-CM

## 2014-01-28 MED ORDER — DIAZEPAM 5 MG PO TABS: 5.0000 mg | ORAL_TABLET | Freq: Three times a day (TID) | ORAL | Status: DC | PRN

## 2014-01-28 MED ORDER — OXYCODONE-ACETAMINOPHEN 5-325 MG PO TABS: 2.0000 | ORAL_TABLET | ORAL | Status: DC | PRN

## 2014-01-28 MED ORDER — DIAZEPAM 5 MG PO TABS
5.0000 mg | ORAL_TABLET | Freq: Once | ORAL | Status: AC
  Administered 2014-01-28: 5 mg via ORAL
  Filled 2014-01-28: qty 1

## 2014-01-28 MED ORDER — OXYCODONE-ACETAMINOPHEN 5-325 MG PO TABS
2.0000 | ORAL_TABLET | Freq: Once | ORAL | Status: AC
  Administered 2014-01-28: 2 via ORAL
  Filled 2014-01-28: qty 2

## 2014-01-28 NOTE — ED Notes (Signed)
Pt A&OX4, wheeled out of ED via wheelchair. NAD.

## 2014-01-28 NOTE — ED Notes (Signed)
Dr Otter at bedside  

## 2014-01-28 NOTE — ED Provider Notes (Signed)
CSN: [id number]     Arrival date & time 01/27/14  2319 History   First MD Initiated Contact with Patient 01/28/14 0402     Chief Complaint  Patient presents with  . Neck Pain     (Consider location/radiation/quality/duration/timing/severity/associated sxs/prior Treatment) HPI 43 year old female presents to emergency department with complaint of right neck and shoulder pain.  Patient reports she woke with pain on June 30.  At that time patient felt she slept wrong and had a crick in her neck.  Pain was limited just to her right neck.  Patient was seen by her doctor on July 2 and given Ultram and Flexeril.  Patient reports symptoms slowly resolved until about 2 days ago when she developed worsening pain in her right shoulder.  The pain is better with laying down, worse with movement and palpation.  She has tried ice and heat without improvement.  Patient had referral to physical therapy, but has not yet made the appointment.  She reports the pain medication given to her prior primary care doctor is no longer helping.  No trauma to the area, no weakness or numbness to the right arm.  No headache no prior history of same Past Medical History  Diagnosis Date  . Hypertension   . Bipolar disorder   . HSV-1 (herpes simplex virus 1) infection   . Diabetes mellitus without complication   . Collapsed lung     "spontaneous" , teenager  . Scoliosis (and kyphoscoliosis), idiopathic   . Anemia    History reviewed. No pertinent past surgical history. Family History  Problem Relation Age of Onset  . Hyperlipidemia Mother   . Hypertension Mother   . Hyperlipidemia Father   . Hypertension Father   . CAD Father   . Heart disease Father    History  Substance Use Topics  . Smoking status: Never Smoker   . Smokeless tobacco: Not on file  . Alcohol Use: No     Comment: occ   OB History   Grav Para Term Preterm Abortions TAB SAB Ect Mult Living                 Review of Systems  See History of  Present Illness; otherwise all other systems are reviewed and negative   Allergies  Tetanus toxoids; Ceclor; Penicillins; and Sulfa antibiotics  Home Medications   Prior to Admission medications   Medication Sig Start Date End Date Taking? Authorizing Provider  cyclobenzaprine (FLEXERIL) 10 MG tablet Take 1 tablet (10 mg total) by mouth 3 (three) times daily as needed for muscle spasms. 1 tab po bid prn muscle spasms 01/19/14  Yes Jeoffrey Massed, MD  levonorgestrel (MIRENA) 20 MCG/24HR IUD 1 each by Intrauterine route once.   Yes Historical Provider, MD  metFORMIN (GLUCOPHAGE) 500 MG tablet 1 tab po qd 01/19/14  Yes Jeoffrey Massed, MD  traMADol (ULTRAM) 50 MG tablet 1-2 tabs po q6h prn pain 01/19/14  Yes Jeoffrey Massed, MD   BP 150/97  Pulse 100  Temp(Src) 97.5 F (36.4 C) (Oral)  Ht 5\' 5"  (1.651 m)  Wt 155 lb (70.308 kg)  BMI 25.79 kg/m2  SpO2 100% Physical Exam  Nursing note and vitals reviewed. Constitutional: She is oriented to person, place, and time. She appears well-developed and well-nourished.  HENT:  Head: Normocephalic and atraumatic.  Right Ear: External ear normal.  Left Ear: External ear normal.  Nose: Nose normal.  Mouth/Throat: Oropharynx is clear and moist.  Eyes: Conjunctivae and  EOM are normal. Pupils are equal, round, and reactive to light.  Neck: Normal range of motion. Neck supple. No JVD present. No tracheal deviation present. No thyromegaly present.  Cardiovascular: Normal rate, regular rhythm, normal heart sounds and intact distal pulses.  Exam reveals no gallop and no friction rub.   No murmur heard. Pulmonary/Chest: Effort normal and breath sounds normal. No stridor. No respiratory distress. She has no wheezes. She has no rales. She exhibits no tenderness.  Abdominal: Soft. Bowel sounds are normal. She exhibits no distension and no mass. There is no tenderness. There is no rebound and no guarding.  Musculoskeletal: Normal range of motion. She exhibits  tenderness (patient has tenderness and spasm to her right trapezius muscle.  She also is tender across her right rotator cuff and infra- spinatus). She exhibits no edema.  Lymphadenopathy:    She has no cervical adenopathy.  Neurological: She is alert and oriented to person, place, and time. She has normal reflexes. No cranial nerve deficit. She exhibits normal muscle tone. Coordination normal.  Skin: Skin is warm and dry. No rash noted. No erythema. No pallor.  Psychiatric: She has a normal mood and affect. Her behavior is normal. Judgment and thought content normal.    ED Course  Procedures (including critical care time) Labs Review Labs Reviewed  CBG MONITORING, ED - Abnormal; Notable for the following:    Glucose-Capillary 200 (*)    All other components within normal limits    Imaging Review No results found.   EKG Interpretation None      MDM   Final diagnoses:  Muscle spasm of right shoulder    43 year old female with muscle spasm of right shoulder.  Plan for continued warm moist heat, Percocet and Valium.  Patient is instructed to followup with physical therapy on Monday    Olivia Mackielga M Ekta Dancer, MD 01/28/14 [phone]

## 2014-01-28 NOTE — Discharge Instructions (Signed)
Heat Therapy Heat therapy can help ease achy, tense, stiff, and tight muscles and joints. Heat should not be used on new injuries. Wait at least 48 hours after the injury before using heat therapy. Heat also should not be used for discomfort or pain that occurs right after doing an activity. If you still have pain or stiffness 3 hours after finishing the activity, then heat therapy may be used. PRECAUTIONS  High heat or prolonged exposure to heat can cause burns. Be careful when using heat therapy to avoid burning your skin. If you have any of the following conditions, do not use heat until you have discussed heat therapy with your caregiver:  Poor circulation.  Healing wounds or scarred skin in the area being treated.  Diabetes, heart disease, or high blood pressure.  Numbness of the area being treated.  Unusual swelling of the area being treated.  Active infections.  Blood clots.  Cancer.  Inability to communicate your response to pain. This can include young children and people with dementia. HOME CARE INSTRUCTIONS Moist heat pack  Soak a clean towel in warm water, and squeeze out the extra water. The water temperature should be comfortable to the skin.  Put the warm, wet towel in a plastic bag.  Place a thin, dry towel between your skin and the bag.  Put the heat pack on the area for 5 minutes, and check your skin. Your skin may be pink, but it should not be red.  Leave the heat pack on the area for a total of 15 to 30 minutes.  Repeat this every 2 to 4 hours while awake. Do not use heat while you are sleeping. Warm water bath  Fill a tub with warm water. The water temperature should be comfortable to the skin.  Place the affected body part in the tub.  Soak the area for 20 to 40 minutes.  Repeat as needed. Hot water bottle  Fill the water bottle half full with hot water.  Press out the extra air. Close the cap tightly.  Place a dry towel between your skin and  the bottle.  Put the bottle on the area for 5 minutes, and check your skin. Your skin may be pink, but it should not be red.  Leave the bottle on the area for a total of 15 to 30 minutes.  Repeat this every 2 to 4 hours while awake. Electric heating pad  Place a dry towel between your skin and the heating pad.  Set the heating pad on low heat.  Put the heating pad on the area for 10 minutes, and check your skin. Your skin may be pink, but it should not be red.  Leave the heating pad on the area for a total of 20 to 40 minutes.  Repeat this every 2 to 4 hours while awake.  Do not lie on the heating pad.  Do not fall asleep while using the heating pad.  Do not use the heating pad near water. Contact with water can result in an electrical shock. SEEK MEDICAL CARE IF:  You have blisters, redness, swelling, or numbness.  You have any new problems.  Your problems are getting worse.  You have any questions or concerns. If you develop any problems, stop using heat therapy until you see your caregiver. MAKE SURE YOU:  Understand these instructions.  Will watch your condition.  Will get help right away if you are not doing well or get worse. Document Released: 09/29/2011  Document Reviewed: 09/29/2011 [facility] Patient Information 2015 [employer], [place]. This information is not intended to replace advice given to you by your health care provider. Make sure you discuss any questions you have with your health care provider.  Muscle Cramps and Spasms Muscle cramps and spasms occur when a muscle or muscles tighten and you have no control over this tightening (involuntary muscle contraction). They are a common problem and can develop in any muscle. The most common place is in the calf muscles of the leg. Both muscle cramps and muscle spasms are involuntary muscle contractions, but they also have differences:   Muscle cramps are sporadic and painful. They may last a few seconds to a  quarter of an hour. Muscle cramps are often more forceful and last longer than muscle spasms.  Muscle spasms may or may not be painful. They may also last just a few seconds or much longer. CAUSES  It is uncommon for cramps or spasms to be due to a serious underlying problem. In many cases, the cause of cramps or spasms is unknown. Some common causes are:   Overexertion.   Overuse from repetitive motions (doing the same thing over and over).   Remaining in a certain position for a long period of time.   Improper preparation, form, or technique while performing a sport or activity.   Dehydration.   Injury.   Side effects of some medicines.   Abnormally low levels of the salts and ions in your blood (electrolytes), especially potassium and calcium. This could happen if you are taking water pills (diuretics) or you are pregnant.  Some underlying medical problems can make it more likely to develop cramps or spasms. These include, but are not limited to:   Diabetes.   Parkinson disease.   Hormone disorders, such as thyroid problems.   Alcohol abuse.   Diseases specific to muscles, joints, and bones.   Blood vessel disease where not enough blood is getting to the muscles.  HOME CARE INSTRUCTIONS   Stay well hydrated. Drink enough water and fluids to keep your urine clear or pale yellow.  It may be helpful to massage, stretch, and relax the affected muscle.  For tight or tense muscles, use a warm towel, heating pad, or hot shower water directed to the affected area.  If you are sore or have pain after a cramp or spasm, applying ice to the affected area may relieve discomfort.  Put ice in a plastic bag.  Place a towel between your skin and the bag.  Leave the ice on for 15-20 minutes, 03-04 times a day.  Medicines used to treat a known cause of cramps or spasms may help reduce their frequency or severity. Only take over-the-counter or prescription medicines as  directed by your caregiver. SEEK MEDICAL CARE IF:  Your cramps or spasms get more severe, more frequent, or do not improve over time.  MAKE SURE YOU:   Understand these instructions.  Will watch your condition.  Will get help right away if you are not doing well or get worse. Document Released: 12/27/2001 Document Revised: 11/01/2012 Document Reviewed: 06/23/2012 [facility]ExitCare Patient Information 2015 [place], [place]. This information is not intended to replace advice given to you by your health care provider. Make sure you discuss any questions you have with your health care provider.

## 2014-01-30 ENCOUNTER — Encounter: Payer: Self-pay | Admitting: Nurse Practitioner

## 2014-02-10 ENCOUNTER — Ambulatory Visit (INDEPENDENT_AMBULATORY_CARE_PROVIDER_SITE_OTHER): Payer: BC Managed Care – PPO | Admitting: Nurse Practitioner

## 2014-02-10 ENCOUNTER — Encounter: Payer: Self-pay | Admitting: Nurse Practitioner

## 2014-02-10 VITALS — BP 132/83 | HR 87 | Temp 98.0°F | Resp 18 | Ht 65.0 in | Wt 150.0 lb

## 2014-02-10 DIAGNOSIS — E1169 Type 2 diabetes mellitus with other specified complication: Secondary | ICD-10-CM | POA: Insufficient documentation

## 2014-02-10 DIAGNOSIS — E119 Type 2 diabetes mellitus without complications: Secondary | ICD-10-CM

## 2014-02-10 DIAGNOSIS — IMO0001 Reserved for inherently not codable concepts without codable children: Secondary | ICD-10-CM

## 2014-02-10 DIAGNOSIS — R748 Abnormal levels of other serum enzymes: Secondary | ICD-10-CM

## 2014-02-10 DIAGNOSIS — E559 Vitamin D deficiency, unspecified: Secondary | ICD-10-CM | POA: Insufficient documentation

## 2014-02-10 DIAGNOSIS — E1165 Type 2 diabetes mellitus with hyperglycemia: Principal | ICD-10-CM

## 2014-02-10 DIAGNOSIS — E785 Hyperlipidemia, unspecified: Secondary | ICD-10-CM

## 2014-02-10 HISTORY — DX: Type 2 diabetes mellitus with other specified complication: E11.69

## 2014-02-10 LAB — CBC WITH DIFFERENTIAL/PLATELET
Basophils Absolute: 0 10*3/uL (ref 0.0–0.1)
Basophils Relative: 0.3 % (ref 0.0–3.0)
Eosinophils Absolute: 0.1 10*3/uL (ref 0.0–0.7)
Eosinophils Relative: 0.9 % (ref 0.0–5.0)
HCT: 34.7 % — ABNORMAL LOW (ref 36.0–46.0)
Hemoglobin: 11.7 g/dL — ABNORMAL LOW (ref 12.0–15.0)
Lymphocytes Relative: 20.9 % (ref 12.0–46.0)
Lymphs Abs: 2.6 10*3/uL (ref 0.7–4.0)
MCHC: 33.8 g/dL (ref 30.0–36.0)
MCV: 90.5 fl (ref 78.0–100.0)
Monocytes Absolute: 0.5 10*3/uL (ref 0.1–1.0)
Monocytes Relative: 3.8 % (ref 3.0–12.0)
Neutro Abs: 9.4 10*3/uL — ABNORMAL HIGH (ref 1.4–7.7)
Neutrophils Relative %: 74.1 % (ref 43.0–77.0)
Platelets: 303 10*3/uL (ref 150.0–400.0)
RBC: 3.84 Mil/uL — ABNORMAL LOW (ref 3.87–5.11)
RDW: 13.8 % (ref 11.5–15.5)
WBC: 12.6 10*3/uL — ABNORMAL HIGH (ref 4.0–10.5)

## 2014-02-10 LAB — URINALYSIS, ROUTINE W REFLEX MICROSCOPIC
Bilirubin Urine: NEGATIVE
Hgb urine dipstick: NEGATIVE
Ketones, ur: NEGATIVE
Leukocytes, UA: NEGATIVE
Nitrite: NEGATIVE
Specific Gravity, Urine: 1.02 (ref 1.000–1.030)
Urine Glucose: NEGATIVE
Urobilinogen, UA: 0.2 (ref 0.0–1.0)
pH: 6 (ref 5.0–8.0)

## 2014-02-10 LAB — HEMOGLOBIN A1C: Hgb A1c MFr Bld: 7.9 % — ABNORMAL HIGH (ref 4.6–6.5)

## 2014-02-10 LAB — COMPREHENSIVE METABOLIC PANEL
ALT: 65 U/L — ABNORMAL HIGH (ref 0–35)
AST: 28 U/L (ref 0–37)
Albumin: 4.3 g/dL (ref 3.5–5.2)
Alkaline Phosphatase: 105 U/L (ref 39–117)
BUN: 13 mg/dL (ref 6–23)
CO2: 29 mEq/L (ref 19–32)
Calcium: 9.7 mg/dL (ref 8.4–10.5)
Chloride: 102 mEq/L (ref 96–112)
Creatinine, Ser: 0.5 mg/dL (ref 0.4–1.2)
GFR: 134.73 mL/min (ref >60.00)
Glucose, Bld: 130 mg/dL — ABNORMAL HIGH (ref 70–99)
Potassium: 3.9 mEq/L (ref 3.5–5.1)
Sodium: 136 mEq/L (ref 135–145)
Total Bilirubin: 0.7 mg/dL (ref 0.2–1.2)
Total Protein: 7.2 g/dL (ref 6.0–8.3)

## 2014-02-10 LAB — LIPID PANEL
Cholesterol: 170 mg/dL (ref 0–200)
HDL: 36.6 mg/dL — ABNORMAL LOW (ref >39.00)
LDL Cholesterol: 99 mg/dL (ref 0–99)
NonHDL: 133.4
Total CHOL/HDL Ratio: 5
Triglycerides: 171 mg/dL — ABNORMAL HIGH (ref 0.0–149.0)
VLDL: 34.2 mg/dL (ref 0.0–40.0)

## 2014-02-10 LAB — VITAMIN D 25 HYDROXY (VIT D DEFICIENCY, FRACTURES): VITD: 20.04 ng/mL — ABNORMAL LOW (ref 30.00–100.00)

## 2014-02-10 LAB — VITAMIN B12: Vitamin B-12: 245 pg/mL (ref 211–911)

## 2014-02-10 MED ORDER — METFORMIN HCL 500 MG PO TABS: 500.0000 mg | ORAL_TABLET | Freq: Two times a day (BID) | ORAL | Status: DC

## 2014-02-10 NOTE — Assessment & Plan Note (Signed)
Check CMET today. 

## 2014-02-10 NOTE — Progress Notes (Signed)
Subjective:     Selena Holt is a 43 y.o. female presents for f/u of DM, elevated liver enzymes, hypertriglyceridemia, vitamin D def. Re DM, she had stopped metformin again due to diarrhea until 1 mo. Ago. She re-started medication: 500 mg qd. She is tolerating better than had in past. Diarrhea not bothersome. Is willing to increase to BID after another lengthy discussion of diabetes complications if she does not get better control. Discussed diet changes & exercise changes. Re elevated liver enzymes: may be due to psych meds-pt states that happened before. No pain or nausea. Re hypertriglyceridemia: discussed diet impact. Re vitamin D Def: stopped taking supplement-"forgot about it". Shared correlational study data of diseases r/t low D.   The following portions of the patient's history were reviewed and updated as appropriate: allergies, current medications, past medical history, past social history, past surgical history and problem list.  Review of Systems Pertinent items are noted in HPI.    Objective:    BP 132/83  Pulse 87  Temp(Src) 98 F (36.7 C) (Oral)  Resp 18  Ht 5\' 5"  (1.651 m)  Wt 150 lb (68.04 kg)  BMI 24.96 kg/m2  SpO2 99% BP 132/83  Pulse 87  Temp(Src) 98 F (36.7 C) (Oral)  Resp 18  Ht 5\' 5"  (1.651 m)  Wt 150 lb (68.04 kg)  BMI 24.96 kg/m2  SpO2 99% General appearance: alert, cooperative, appears stated age and no distress Head: Normocephalic, without obvious abnormality, atraumatic Eyes: negative findings: lids and lashes normal and conjunctivae and sclerae normal Throat: lips, mucosa, and tongue normal; teeth and gums normal Lungs: clear to auscultation bilaterally Heart: regular rate and rhythm, S1, S2 normal, no murmur, click, rub or gallop Lymph nodes: no cervical LAD     Assessment:   1. Type II or unspecified type diabetes mellitus without mention of complication, uncontrolled - CBC with Differential - Hemoglobin A1c - Urinalysis, Routine w  reflex microscopic - Comprehensive metabolic panel - Vit D  25 hydroxy (rtn osteoporosis monitoring) - Vitamin B12 - Lipid panel  2. Unspecified vitamin D deficiency - Vit D  25 hydroxy (rtn osteoporosis monitoring)  3. Type 2 diabetes mellitus without complication - metFORMIN (GLUCOPHAGE) 500 MG tablet; Take 1 tablet (500 mg total) by mouth 2 (two) times daily with a meal. 1 tab po qd  Dispense: 60 tablet; Refill: 2  4. Other and unspecified hyperlipidemia  5. Elevated liver enzymes  See problem list for complete A&P See pt instructions. F/u 4 weeks.

## 2014-02-10 NOTE — Assessment & Plan Note (Signed)
Pt stopped supplement, forgot about it. Discussed corelational findings w/low D: AID, Cancers, poor bone health. Check level today.

## 2014-02-10 NOTE — Progress Notes (Signed)
Pre visit review using our clinic review tool, if applicable. No additional management support is needed unless otherwise documented below in the visit note. 

## 2014-02-10 NOTE — Patient Instructions (Signed)
For best sugar control and less medication needs: Walk 30 to 45 minutes daily. You can do this in 10 to 15 minute increments. The benefits include lower blood sugar, weight loss, lower risk for heart disease, stroke, high blood pressure, lower rates of depression & dementia, better sleep quality & bone health.  Consider reading Eat to Live by Monico HoarJoel Fuhrman and begin implementing principles. Replace foods made with refined (white) flour with plant foods like beans, peas, vegetables, fresh fruit, brown rice, whole grains cereals & breads (these should have at least 3 g fiber per serving). Refined flour foods are: white rolls, breads, biscuits, bagels, muffins, cake, cookies.  Look for waters with caffeine to replace some or all of your caffeinated sodas.   Read food labels: avoid corn syrup & high fructose sugars.  Cook at home a few times/week.  Increase metformin to twice daily. Eating & drinking less refined carbohydrates will decrease gas & bloating.  Please return in 1 month so I can review how things are going with metformin & diet changes. Great to see you!

## 2014-02-13 ENCOUNTER — Telehealth: Payer: Self-pay | Admitting: Nurse Practitioner

## 2014-02-13 NOTE — Telephone Encounter (Signed)
pls call pt: Advise All numbers look better! A1C has improved to 7.9 from 9.1. Her goal is >7.0, so, a little way to go. Continue with metformin & diet & exercise changes. Liver enzymes almost back to normal. Vitamin D very low. Start D3 5000 iu daily with lunch. Vitamin B12 borderline. Start oral B complex daily to get 1200 mcg of B12 daily (read label). She may need to take this as long as she is on metformin. It is recommended that all people with diabetes take a statin drug for best cholesterol management, but I will hold off for now, since we just increased metformin. We will discuss further at follow up, as her numbers aren't too bad.

## 2014-02-14 NOTE — Telephone Encounter (Signed)
Spoke with pt, advised message from Layne. Pt understood. 

## 2014-02-14 NOTE — Telephone Encounter (Signed)
LMOVM for pt to return call concerning results. 

## 2014-03-09 ENCOUNTER — Ambulatory Visit (INDEPENDENT_AMBULATORY_CARE_PROVIDER_SITE_OTHER): Payer: BC Managed Care – PPO | Admitting: Nurse Practitioner

## 2014-03-09 ENCOUNTER — Encounter: Payer: Self-pay | Admitting: Nurse Practitioner

## 2014-03-09 VITALS — Ht 65.0 in

## 2014-03-09 DIAGNOSIS — E1165 Type 2 diabetes mellitus with hyperglycemia: Secondary | ICD-10-CM

## 2014-03-09 DIAGNOSIS — R809 Proteinuria, unspecified: Secondary | ICD-10-CM

## 2014-03-09 DIAGNOSIS — E538 Deficiency of other specified B group vitamins: Secondary | ICD-10-CM

## 2014-03-09 DIAGNOSIS — D72829 Elevated white blood cell count, unspecified: Secondary | ICD-10-CM | POA: Insufficient documentation

## 2014-03-09 DIAGNOSIS — E559 Vitamin D deficiency, unspecified: Secondary | ICD-10-CM

## 2014-03-09 DIAGNOSIS — IMO0001 Reserved for inherently not codable concepts without codable children: Secondary | ICD-10-CM

## 2014-03-09 DIAGNOSIS — R748 Abnormal levels of other serum enzymes: Secondary | ICD-10-CM

## 2014-03-09 LAB — POCT URINALYSIS DIPSTICK
Bilirubin, UA: NEGATIVE
Blood, UA: NEGATIVE
Glucose, UA: NEGATIVE
Ketones, UA: NEGATIVE
Leukocytes, UA: NEGATIVE
Nitrite, UA: NEGATIVE
Protein, UA: 100
Spec Grav, UA: 1.025
Urobilinogen, UA: 1
pH, UA: 5.5

## 2014-03-09 NOTE — Assessment & Plan Note (Signed)
Near normal 1 mo. Ago. Will check again in 2 mos.

## 2014-03-09 NOTE — Assessment & Plan Note (Signed)
Taking 2000 iu qd. Continue. Check level in 2 mos.

## 2014-03-09 NOTE — Assessment & Plan Note (Addendum)
Improved A1C: 7.9 from 9.0. Increased metformin to BID at last visit. Tolerating well. No diarrhea. Made diet changes-replaced ham & cheese sandwich at breakfast w/high fiber cereal. Drinking more water, less soda. Motivated to continue w/food changes. Not exercising.  Check A1C in 2 mos. Check lipids in 5 mos. Discussed how diet & exercise will help lipids.

## 2014-03-09 NOTE — Progress Notes (Signed)
Subjective:     Selena Holt is a 43 y.o. female presents for f/u of Dm-diet & med changes, vit B & D deficiencies, proteinuria, & mild elevation of WBC. Re DM: she has made several positive changes in diet-eating less animal fats, eating more fiber, drinking less soda. Still not exercising. Last visit, I increased metformin from qd to bid. She is tolerating well without SE. She is motivated to continue with changes. She started both D3 & Bcomplex supplements. Discussed likely that will need to continue with B complex while on metformin. Re proteinuria: asymptomatic, no identifiable cause such as fever or heavy exercise. Re elevation of WBC, pt reports occasional elevation since she was in 20's in absence if illness. She felt fatigued last week, but it has improved. She is concerned that she is having spotting. She has mirena & is due for replacement in 2 mos. Advised to contact gyn & let them know-may need replacement early if med is wearing off.  The following portions of the patient's history were reviewed and updated as appropriate: allergies, current medications, past medical history, past social history, past surgical history and problem list.  Review of Systems Pertinent items are noted in HPI.    Objective:    Ht 5\' 5"  (1.651 m) Ht 5\' 5"  (1.651 m) General appearance: alert, cooperative, appears stated age and no distress Head: Normocephalic, without obvious abnormality, atraumatic Eyes: negative findings: lids and lashes normal and conjunctivae and sclerae normal    Assessment:     1. Proteinuria - POCT urinalysis dipstick  2. Type II or unspecified type diabetes mellitus without mention of complication, uncontrolled  3. Elevated liver enzymes  4. Unspecified vitamin D deficiency  5. Vitamin B 12 deficiency  6. Leukocytosis, unspecified  Spent more than 20 minutes in counseling of diet & exercise changes using motivational interviewing.  See problem list for complete  A&P See pt instructions. F/u 2 mos.

## 2014-03-09 NOTE — Progress Notes (Signed)
Pre visit review using our clinic review tool, if applicable. No additional management support is needed unless otherwise documented below in the visit note. 

## 2014-03-09 NOTE — Assessment & Plan Note (Signed)
No fever or heavy exercise. DM Will continue to monitor.

## 2014-03-09 NOTE — Assessment & Plan Note (Signed)
Started Bcomplex last visit. Continue as long as on metformin. Check level next visit.

## 2014-03-09 NOTE — Assessment & Plan Note (Signed)
Mild elevation.  Pt gives Hx of this since she was in 20's without symptoms of illness. Will check CBC in 2 mos Lymph node check every visit.

## 2014-03-09 NOTE — Patient Instructions (Signed)
Continue with positive food changes. Great job!  Consider becoming aware of fiber grams. If you get 40 or more grams of fiber daily from real food: fruits, vegetables, whole grains, beans, peas-you will be eating nutrient dense, healthy foods. Don't count fiber from a bottle (citrucel) or from prepared food like a nutri-grain bar.  Continue with vitamin D & Bcomplex.  Consider increasing movement: start with 15 minutes day.  Continue with metformin twice daily.  Return in 2 months for labs.  Great to see you!

## 2014-04-03 ENCOUNTER — Encounter: Payer: Self-pay | Admitting: Nurse Practitioner

## 2014-04-04 ENCOUNTER — Other Ambulatory Visit: Payer: Self-pay | Admitting: Nurse Practitioner

## 2014-04-04 DIAGNOSIS — E119 Type 2 diabetes mellitus without complications: Secondary | ICD-10-CM

## 2014-04-04 MED ORDER — METFORMIN HCL 500 MG PO TABS: 500.0000 mg | ORAL_TABLET | Freq: Two times a day (BID) | ORAL | Status: DC

## 2014-04-06 ENCOUNTER — Ambulatory Visit (INDEPENDENT_AMBULATORY_CARE_PROVIDER_SITE_OTHER): Payer: BC Managed Care – PPO

## 2014-04-06 ENCOUNTER — Ambulatory Visit: Payer: BC Managed Care – PPO

## 2014-04-06 ENCOUNTER — Encounter: Payer: Self-pay | Admitting: Nurse Practitioner

## 2014-04-06 ENCOUNTER — Other Ambulatory Visit: Payer: Self-pay | Admitting: Nurse Practitioner

## 2014-04-06 DIAGNOSIS — Z23 Encounter for immunization: Secondary | ICD-10-CM

## 2014-04-06 DIAGNOSIS — E119 Type 2 diabetes mellitus without complications: Secondary | ICD-10-CM

## 2014-04-06 DIAGNOSIS — D519 Vitamin B12 deficiency anemia, unspecified: Secondary | ICD-10-CM

## 2014-04-07 ENCOUNTER — Encounter: Payer: Self-pay | Admitting: Nurse Practitioner

## 2014-04-10 ENCOUNTER — Other Ambulatory Visit: Payer: Self-pay | Admitting: Nurse Practitioner

## 2014-04-10 DIAGNOSIS — E119 Type 2 diabetes mellitus without complications: Secondary | ICD-10-CM

## 2014-04-10 MED ORDER — METFORMIN HCL 500 MG PO TABS
500.0000 mg | ORAL_TABLET | Freq: Three times a day (TID) | ORAL | Status: DC
Start: 2014-04-10 — End: 2014-04-17

## 2014-04-10 NOTE — Progress Notes (Signed)
Spoke with pt, advised message from Layne 

## 2014-04-12 ENCOUNTER — Encounter: Payer: Self-pay | Admitting: Nurse Practitioner

## 2014-04-12 ENCOUNTER — Other Ambulatory Visit: Payer: Self-pay | Admitting: Obstetrics and Gynecology

## 2014-04-12 DIAGNOSIS — R928 Other abnormal and inconclusive findings on diagnostic imaging of breast: Secondary | ICD-10-CM

## 2014-04-17 ENCOUNTER — Ambulatory Visit
Admission: RE | Admit: 2014-04-17 | Discharge: 2014-04-17 | Disposition: A | Discharge status: Home / Self Care | Payer: BC Managed Care – PPO | Source: Ambulatory Visit | Attending: Obstetrics and Gynecology | Admitting: Obstetrics and Gynecology

## 2014-04-17 ENCOUNTER — Other Ambulatory Visit: Payer: Self-pay | Admitting: Nurse Practitioner

## 2014-04-17 ENCOUNTER — Other Ambulatory Visit: Payer: BC Managed Care – PPO

## 2014-04-17 DIAGNOSIS — R928 Other abnormal and inconclusive findings on diagnostic imaging of breast: Secondary | ICD-10-CM

## 2014-04-17 DIAGNOSIS — E119 Type 2 diabetes mellitus without complications: Secondary | ICD-10-CM

## 2014-04-17 MED ORDER — METFORMIN HCL 500 MG PO TABS: 1000.0000 mg | ORAL_TABLET | Freq: Two times a day (BID) | ORAL | Status: DC

## 2014-04-18 ENCOUNTER — Other Ambulatory Visit (INDEPENDENT_AMBULATORY_CARE_PROVIDER_SITE_OTHER): Payer: BC Managed Care – PPO

## 2014-04-18 DIAGNOSIS — E119 Type 2 diabetes mellitus without complications: Secondary | ICD-10-CM

## 2014-04-18 DIAGNOSIS — D519 Vitamin B12 deficiency anemia, unspecified: Secondary | ICD-10-CM

## 2014-04-18 DIAGNOSIS — D518 Other vitamin B12 deficiency anemias: Secondary | ICD-10-CM

## 2014-04-18 LAB — CBC
HCT: 36.4 % (ref 36.0–46.0)
Hemoglobin: 12 g/dL (ref 12.0–15.0)
MCHC: 32.9 g/dL (ref 30.0–36.0)
MCV: 93.1 fl (ref 78.0–100.0)
Platelets: 279 10*3/uL (ref 150.0–400.0)
RBC: 3.91 Mil/uL (ref 3.87–5.11)
RDW: 13.6 % (ref 11.5–15.5)
WBC: 9.6 10*3/uL (ref 4.0–10.5)

## 2014-04-18 LAB — LIPID PANEL
Cholesterol: 181 mg/dL (ref 0–200)
HDL: 33.1 mg/dL — ABNORMAL LOW (ref >39.00)
LDL Cholesterol: 120 mg/dL — ABNORMAL HIGH (ref 0–99)
NonHDL: 147.9
Total CHOL/HDL Ratio: 5
Triglycerides: 138 mg/dL (ref 0.0–149.0)
VLDL: 27.6 mg/dL (ref 0.0–40.0)

## 2014-04-18 LAB — COMPREHENSIVE METABOLIC PANEL
ALT: 89 U/L — ABNORMAL HIGH (ref 0–35)
AST: 49 U/L — ABNORMAL HIGH (ref 0–37)
Albumin: 4.4 g/dL (ref 3.5–5.2)
Alkaline Phosphatase: 80 U/L (ref 39–117)
BUN: 9 mg/dL (ref 6–23)
CO2: 23 mEq/L (ref 19–32)
Calcium: 9.4 mg/dL (ref 8.4–10.5)
Chloride: 104 mEq/L (ref 96–112)
Creatinine, Ser: 0.6 mg/dL (ref 0.4–1.2)
GFR: 118.94 mL/min (ref >60.00)
Glucose, Bld: 128 mg/dL — ABNORMAL HIGH (ref 70–99)
Potassium: 4.3 mEq/L (ref 3.5–5.1)
Sodium: 137 mEq/L (ref 135–145)
Total Bilirubin: 0.7 mg/dL (ref 0.2–1.2)
Total Protein: 7.3 g/dL (ref 6.0–8.3)

## 2014-04-18 LAB — MICROALBUMIN / CREATININE URINE RATIO
Creatinine,U: 158 mg/dL
Microalb Creat Ratio: 19.1 mg/g (ref 0.0–30.0)
Microalb, Ur: 30.2 mg/dL — ABNORMAL HIGH (ref 0.0–1.9)

## 2014-04-18 LAB — VITAMIN B12: Vitamin B-12: 1233 pg/mL — ABNORMAL HIGH (ref 211–911)

## 2014-04-20 ENCOUNTER — Other Ambulatory Visit: Payer: BC Managed Care – PPO

## 2014-04-20 ENCOUNTER — Telehealth: Payer: Self-pay | Admitting: Nurse Practitioner

## 2014-04-20 LAB — IRON AND TIBC
%SAT: 14 % — ABNORMAL LOW (ref 20–55)
Iron: 56 ug/dL (ref 42–145)
TIBC: 403 ug/dL (ref 250–470)
UIBC: 347 ug/dL (ref 125–400)

## 2014-04-20 NOTE — Telephone Encounter (Signed)
Patient notified of results. Patient will call back to make dec appt. Patient wanted to know if to much b-12 supplement can cause diarrhea?

## 2014-04-20 NOTE — Telephone Encounter (Signed)
LMOVM for pt to return call 

## 2014-04-20 NOTE — Telephone Encounter (Signed)
Don't think so. She should only take metformin twice daily. Taking it more often can cause diarrhea.

## 2014-04-20 NOTE — Telephone Encounter (Signed)
pls call pt: Include previous lab note during call so she gets results on all labs Just got iron panel back-she is little low.  She should take an iron supplement at least 3 days/week or a multivitamin with iron. I will send prescription if she wants-not sure what will be cheaper. (She lost insurance).

## 2014-04-28 NOTE — Telephone Encounter (Signed)
Left detailed message pt's vm. Per dpr.

## 2014-05-10 ENCOUNTER — Other Ambulatory Visit: Payer: BC Managed Care – PPO

## 2014-09-28 ENCOUNTER — Encounter: Payer: Self-pay | Admitting: Nurse Practitioner

## 2014-09-28 ENCOUNTER — Ambulatory Visit (INDEPENDENT_AMBULATORY_CARE_PROVIDER_SITE_OTHER): Payer: BLUE CROSS/BLUE SHIELD | Admitting: Nurse Practitioner

## 2014-09-28 ENCOUNTER — Telehealth: Payer: Self-pay | Admitting: Nurse Practitioner

## 2014-09-28 VITALS — BP 123/87 | HR 86 | Temp 98.2°F | Ht 65.0 in | Wt 146.0 lb

## 2014-09-28 DIAGNOSIS — E538 Deficiency of other specified B group vitamins: Secondary | ICD-10-CM

## 2014-09-28 DIAGNOSIS — E559 Vitamin D deficiency, unspecified: Secondary | ICD-10-CM

## 2014-09-28 DIAGNOSIS — D72829 Elevated white blood cell count, unspecified: Secondary | ICD-10-CM

## 2014-09-28 DIAGNOSIS — E119 Type 2 diabetes mellitus without complications: Secondary | ICD-10-CM

## 2014-09-28 LAB — CBC WITH DIFFERENTIAL/PLATELET
Basophils Absolute: 0 10*3/uL (ref 0.0–0.1)
Basophils Relative: 0.3 % (ref 0.0–3.0)
Eosinophils Absolute: 0.1 10*3/uL (ref 0.0–0.7)
Eosinophils Relative: 1.1 % (ref 0.0–5.0)
HCT: 38 % (ref 36.0–46.0)
Hemoglobin: 12.8 g/dL (ref 12.0–15.0)
Lymphocytes Relative: 24.1 % (ref 12.0–46.0)
Lymphs Abs: 2.4 10*3/uL (ref 0.7–4.0)
MCHC: 33.8 g/dL (ref 30.0–36.0)
MCV: 90.5 fl (ref 78.0–100.0)
Monocytes Absolute: 0.5 10*3/uL (ref 0.1–1.0)
Monocytes Relative: 4.6 % (ref 3.0–12.0)
Neutro Abs: 6.9 10*3/uL (ref 1.4–7.7)
Neutrophils Relative %: 69.9 % (ref 43.0–77.0)
Platelets: 287 10*3/uL (ref 150.0–400.0)
RBC: 4.19 Mil/uL (ref 3.87–5.11)
RDW: 13.7 % (ref 11.5–15.5)
WBC: 9.9 10*3/uL (ref 4.0–10.5)

## 2014-09-28 LAB — LIPID PANEL
Cholesterol: 167 mg/dL (ref 0–200)
HDL: 35 mg/dL — ABNORMAL LOW (ref >39.00)
LDL Cholesterol: 114 mg/dL — ABNORMAL HIGH (ref 0–99)
NonHDL: 132
Total CHOL/HDL Ratio: 5
Triglycerides: 88 mg/dL (ref 0.0–149.0)
VLDL: 17.6 mg/dL (ref 0.0–40.0)

## 2014-09-28 LAB — MICROALBUMIN / CREATININE URINE RATIO
Creatinine,U: 122.3 mg/dL
Microalb Creat Ratio: 9.8 mg/g (ref 0.0–30.0)
Microalb, Ur: 12 mg/dL — ABNORMAL HIGH (ref 0.0–1.9)

## 2014-09-28 LAB — COMPREHENSIVE METABOLIC PANEL
ALT: 36 U/L — ABNORMAL HIGH (ref 0–35)
AST: 16 U/L (ref 0–37)
Albumin: 5.1 g/dL (ref 3.5–5.2)
Alkaline Phosphatase: 93 U/L (ref 39–117)
BUN: 12 mg/dL (ref 6–23)
CO2: 29 mEq/L (ref 19–32)
Calcium: 9.8 mg/dL (ref 8.4–10.5)
Chloride: 103 mEq/L (ref 96–112)
Creatinine, Ser: 0.66 mg/dL (ref 0.40–1.20)
GFR: 104.28 mL/min (ref >60.00)
Glucose, Bld: 130 mg/dL — ABNORMAL HIGH (ref 70–99)
Potassium: 4.2 mEq/L (ref 3.5–5.1)
Sodium: 137 mEq/L (ref 135–145)
Total Bilirubin: 0.7 mg/dL (ref 0.2–1.2)
Total Protein: 7.7 g/dL (ref 6.0–8.3)

## 2014-09-28 LAB — TSH: TSH: 2.27 u[IU]/mL (ref 0.35–4.50)

## 2014-09-28 LAB — VITAMIN D 25 HYDROXY (VIT D DEFICIENCY, FRACTURES): VITD: 17.13 ng/mL — ABNORMAL LOW (ref 30.00–100.00)

## 2014-09-28 LAB — HEMOGLOBIN A1C: Hgb A1c MFr Bld: 6.8 % — ABNORMAL HIGH (ref 4.6–6.5)

## 2014-09-28 LAB — VITAMIN B12: Vitamin B-12: 512 pg/mL (ref 211–911)

## 2014-09-28 MED ORDER — CHOLECALCIFEROL 5000 UNIT/ML PO LIQD: 5000.0000 [IU] | Freq: Every day | ORAL | Status: DC

## 2014-09-28 NOTE — Progress Notes (Signed)
Pre visit review using our clinic review tool, if applicable. No additional management support is needed unless otherwise documented below in the visit note. 

## 2014-09-28 NOTE — Patient Instructions (Signed)
Get eye exam.  My office will call with lab results.  Exercise is super important for people with diabetes because it lowers blood sugar & helps heart stay healthy. Take a 30 minute walk every day. DON'T GIVE UP IF YOU MISS A DAY. Other benefits include: weight loss; lower risk for heart disease, stroke, high blood pressure, lower rates of depression & dementia, better sleep quality & bone health.  Plan on seeing me in 3 months.  Great to see you!

## 2014-09-28 NOTE — Telephone Encounter (Signed)
pls call pt: Advise Labs have improved: A1c 6.8, cholesterol great-keep up with diet changes & increase exercise! No changes to meds.  She does need to restart Bcomplex to get 1200 mcg B12 daily. Also start vit D. I will send in prescription. She will take for 12 weeks. I will check again at next OV in 3 mos.

## 2014-09-29 NOTE — Telephone Encounter (Signed)
LMOVM for patient to return call concerning lab results.  

## 2014-09-29 NOTE — Telephone Encounter (Signed)
Patient notified

## 2014-09-29 NOTE — Progress Notes (Signed)
Subjective:     Selena Holt is a 44 y.o. female presents for f/u of DM, Vit D def, B12 deficiency-likely due to metformin use, & luekocytosis. DM 2: current meds-metofrmin 50 mg qd. Tolerating w/out SE. Improved A1C at last OV: 7.9 from 9.0. She has lost another 4 pounds in 3 mos & now has a normal BMI. She is due for eye exam, denies visual changes. She is having polyuria. RF: sedentary. Vit D: not taking supplement. Last level deficicent at 20. Discussed correlation w/disease states & implications of D deficiency/improtance of taking supplement daily. B12: denies paresthesia, fatigue. She has Hx of anemia w/low iron sat. Not taking B supplement. Lowest level was 245. Normalized after oral supplementation. Feels well.  Leukocytosis: Hx elevated wbc. Denies frequent infections, fatigue The following portions of the patient's history were reviewed and updated as appropriate: allergies, current medications, past medical history, past social history, past surgical history and problem list.  Review of Systems Constitutional: negative for fatigue and fevers Eyes: negative for visual disturbance Respiratory: negative for cough Cardiovascular: negative for irregular heart beat and lower extremity edema Gastrointestinal: negative for abdominal pain, change in bowel habits, constipation and diarrhea Genitourinary:negative for abnormal menstrual periods Behavioral/Psych: negative for anxiety, bad mood, depression and fatigue    Objective:    BP 123/87 mmHg  Pulse 86  Temp(Src) 98.2 F (36.8 C) (Temporal)  Ht 5\' 5"  (1.651 m)  Wt 146 lb (66.225 kg)  BMI 24.30 kg/m2  SpO2 100% BP 123/87 mmHg  Pulse 86  Temp(Src) 98.2 F (36.8 C) (Temporal)  Ht 5\' 5"  (1.651 m)  Wt 146 lb (66.225 kg)  BMI 24.30 kg/m2  SpO2 100% General appearance: alert, cooperative, appears stated age and no distress Head: Normocephalic, without obvious abnormality, atraumatic Eyes: negative findings: lids and lashes  normal and conjunctivae and sclerae normal Throat: lips, mucosa, and tongue normal; teeth and gums normal Lungs: clear to auscultation bilaterally Heart: regular rate and rhythm, S1, S2 normal, no murmur, click, rub or gallop Abdomen: soft, non-tender; bowel sounds normal; no masses,  no organomegaly Extremities: extremities normal, atraumatic, no cyanosis or edema Pulses: 2+ and symmetric Neurologic: Grossly normal    Assessment:Plan   1. Vitamin D deficiency - Vit D  25 hydroxy (rtn osteoporosis monitoring)  2. Vitamin B 12 deficiency - Vitamin B12  3. Type 2 diabetes mellitus without complication Continue diet changes, daily exercise, current dose metformin - Comprehensive metabolic panel - Hemoglobin A1c - Lipid panel - TSH - Microalbumin / creatinine urine ratio  4. Leukocytosis - CBC with Differential/Platelet  F/u 3 mos

## 2014-10-11 ENCOUNTER — Encounter: Payer: Self-pay | Admitting: Nurse Practitioner

## 2014-10-30 ENCOUNTER — Encounter: Payer: Self-pay | Admitting: Nurse Practitioner

## 2014-10-30 ENCOUNTER — Ambulatory Visit (INDEPENDENT_AMBULATORY_CARE_PROVIDER_SITE_OTHER): Payer: BLUE CROSS/BLUE SHIELD | Admitting: Nurse Practitioner

## 2014-10-30 VITALS — BP 116/79 | HR 80 | Temp 97.8°F | Ht 65.0 in | Wt 147.0 lb

## 2014-10-30 DIAGNOSIS — R0789 Other chest pain: Secondary | ICD-10-CM

## 2014-10-30 DIAGNOSIS — R51 Headache: Secondary | ICD-10-CM | POA: Diagnosis not present

## 2014-10-30 DIAGNOSIS — D649 Anemia, unspecified: Secondary | ICD-10-CM | POA: Diagnosis not present

## 2014-10-30 DIAGNOSIS — H6121 Impacted cerumen, right ear: Secondary | ICD-10-CM

## 2014-10-30 DIAGNOSIS — R519 Headache, unspecified: Secondary | ICD-10-CM

## 2014-10-30 LAB — IRON AND TIBC
%SAT: 19 % — ABNORMAL LOW (ref 20–55)
Iron: 76 ug/dL (ref 42–145)
TIBC: 404 ug/dL (ref 250–470)
UIBC: 328 ug/dL (ref 125–400)

## 2014-10-30 NOTE — Patient Instructions (Signed)
My office will call with lab results.  Headache & stuffy ears may be due to allergy. Start claritin 10 mg daily. Take through end of May. Let me know if symptoms do not improve over 10 days.  Blurred vision & lightheadedness can have many causes. You are slightly anemic and iron has been low in past. Iron deficiency could cause symptoms.   Chest tingling may be gastrointestinal related. Next time it occurs, try Tums or beano, gasx.  Return in 3 to 4 weeks to re-evaluate.

## 2014-10-30 NOTE — Progress Notes (Signed)
Pre visit review using our clinic review tool, if applicable. No additional management support is needed unless otherwise documented below in the visit note. 

## 2014-10-31 ENCOUNTER — Other Ambulatory Visit: Payer: Self-pay | Admitting: Nurse Practitioner

## 2014-10-31 DIAGNOSIS — H6120 Impacted cerumen, unspecified ear: Secondary | ICD-10-CM | POA: Insufficient documentation

## 2014-10-31 DIAGNOSIS — R51 Headache: Secondary | ICD-10-CM

## 2014-10-31 DIAGNOSIS — R519 Headache, unspecified: Secondary | ICD-10-CM | POA: Insufficient documentation

## 2014-10-31 DIAGNOSIS — R0789 Other chest pain: Secondary | ICD-10-CM | POA: Insufficient documentation

## 2014-10-31 NOTE — Progress Notes (Signed)
Subjective:     Selena Holt is a 44 y.o. female presents w/c/o lightheaded & blurred vision, frontal HA & ears feel full, tingling sensation in chest. She has Hx DM2 (A1c under 7), mild iron deficiency anemia.  lightheaded & blurred vision: intermittent. Onset 1 week. Several episodes. Last night felt dizzy after eating dinner, vision-"everything looked gray", felt like would pass out. Felt better this am. Has occasional palpitations lasting few seconds, not r/t complaint. Denies nausea, SOB, diaphoresis. Last eye exam 8 mos ago.  frontal HA & ears feel full: no Hx seasonal allergies. Denies itchy throat ears, eyes, nasal congestion, sore throat, HA.   tingling sensation in chest: 1 episode. Starts L chest & radiates to center.   The following portions of the patient's history were reviewed and updated as appropriate: allergies, current medications, past medical history, past social history, past surgical history and problem list.  Review of Systems Pertinent items are noted in HPI.    Objective:    BP 116/79 mmHg  Pulse 80  Temp(Src) 97.8 F (36.6 C) (Oral)  Ht 5\' 5"  (1.651 m)  Wt 147 lb (66.679 kg)  BMI 24.46 kg/m2  SpO2 100% BP 116/79 mmHg  Pulse 80  Temp(Src) 97.8 F (36.6 C) (Oral)  Ht 5\' 5"  (1.651 m)  Wt 147 lb (66.679 kg)  BMI 24.46 kg/m2  SpO2 100% General appearance: alert, cooperative, appears stated age and no distress Head: Normocephalic, without obvious abnormality, atraumatic Eyes: negative findings: lids and lashes normal, conjunctivae and sclerae normal, corneas clear, pupils equal, round, reactive to light and accomodation and visual fields full to confrontation Ears: normal TM's and external ear canals both ears Nose: Nares normal. Septum midline. Mucosa normal. No drainage or sinus tenderness. Throat: lips, mucosa, and tongue normal; teeth and gums normal Neck: no adenopathy, no carotid bruit, supple, symmetrical, trachea midline and thyroid not enlarged,  symmetric, no tenderness/mass/nodules Lungs: clear to auscultation bilaterally Heart: regular rate and rhythm, S1, S2 normal, no murmur, click, rub or gallop Neurologic: Alert and oriented X 3, normal strength and tone. Normal symmetric reflexes. Normal coordination and gait Cranial nerves: normal Motor: grossly normal Coordination: finger to nose normal bilaterally Gait: Normal    Assessment:Plan     1. Anemia, unspecified anemia type Not taking iron supplement - Intrinsic Factor Antibodies - Iron and TIBC  2. Chest discomfort DD: GI, thyroid disease, iron def Take TUMS, gasx  3. Ceruminosis, right Successful ear wax removal with lavage & currettage Canal & TM nontraumatic  4. Frontal headache DD: allergy Start claritin  F/u 3 wks.

## 2014-11-02 ENCOUNTER — Other Ambulatory Visit: Payer: Self-pay | Admitting: Nurse Practitioner

## 2014-11-02 ENCOUNTER — Telehealth: Payer: Self-pay | Admitting: Nurse Practitioner

## 2014-11-02 DIAGNOSIS — E119 Type 2 diabetes mellitus without complications: Secondary | ICD-10-CM

## 2014-11-02 DIAGNOSIS — D509 Iron deficiency anemia, unspecified: Secondary | ICD-10-CM

## 2014-11-02 LAB — INTRINSIC FACTOR ANTIBODIES: Intrinsic Factor: NEGATIVE

## 2014-11-02 MED ORDER — FERROUS GLUCONATE 324 (38 FE) MG PO TABS
324.0000 mg | ORAL_TABLET | Freq: Every day | ORAL | Status: DC
Start: 2014-11-02 — End: 2015-03-05

## 2014-11-02 MED ORDER — METFORMIN HCL 500 MG PO TABS: 500.0000 mg | ORAL_TABLET | Freq: Every day | ORAL | Status: DC

## 2014-11-02 NOTE — Telephone Encounter (Signed)
Labs: iron is low. Will start iron supplement. No ab to intrinsic factor, so B12 def likely r/t metformin.  Lightheadedness & blurred vision may be due to metformin. A1C is under 7, so I will decrease metformin dose to 1500 mg from 2000 mg. Pt is to f/u 2 wks after med change.

## 2014-11-02 NOTE — Telephone Encounter (Signed)
So, how long has she been taking 500mg  qd?  I will send in prescription for iron. If it is too expensive, she can buy OTC supplement. OTC supplements tend to cause constipation.  She should take iron and eat an orange or take 500 mg vitamin C with it for best absorption. She should not take it within 1 hour of eating mild products or taking antacids.

## 2014-11-02 NOTE — Telephone Encounter (Signed)
Called and spoke with patient. She has been taking the Metformin 500mg  qd for a "long time" at least since September.

## 2014-11-02 NOTE — Telephone Encounter (Signed)
Did you send in a prescription for the iron supplement or what would you like her to take? Patient told me that she is currently doing 500mg  of the Metformin currently. She eats breakfast at 8-8:30, lunch is about 11:30-12, dinner is about 6:30.   Patient gets another break 245-3p and asks for us to call back during that time

## 2014-11-10 ENCOUNTER — Other Ambulatory Visit: Payer: Self-pay | Admitting: Obstetrics and Gynecology

## 2014-11-14 LAB — CYTOLOGY - PAP

## 2014-12-01 ENCOUNTER — Encounter (HOSPITAL_COMMUNITY): Payer: Self-pay | Admitting: Emergency Medicine

## 2014-12-01 ENCOUNTER — Emergency Department (HOSPITAL_COMMUNITY)
Admission: EM | Admit: 2014-12-01 | Discharge: 2014-12-02 | Disposition: A | Discharge status: Home / Self Care | Payer: BLUE CROSS/BLUE SHIELD | Attending: Emergency Medicine | Admitting: Emergency Medicine

## 2014-12-01 DIAGNOSIS — D649 Anemia, unspecified: Secondary | ICD-10-CM | POA: Diagnosis not present

## 2014-12-01 DIAGNOSIS — Z3202 Encounter for pregnancy test, result negative: Secondary | ICD-10-CM | POA: Insufficient documentation

## 2014-12-01 DIAGNOSIS — Z880 Allergy status to penicillin: Secondary | ICD-10-CM | POA: Diagnosis not present

## 2014-12-01 DIAGNOSIS — I10 Essential (primary) hypertension: Secondary | ICD-10-CM | POA: Diagnosis not present

## 2014-12-01 DIAGNOSIS — E119 Type 2 diabetes mellitus without complications: Secondary | ICD-10-CM | POA: Diagnosis not present

## 2014-12-01 DIAGNOSIS — Z8619 Personal history of other infectious and parasitic diseases: Secondary | ICD-10-CM | POA: Insufficient documentation

## 2014-12-01 DIAGNOSIS — Z79899 Other long term (current) drug therapy: Secondary | ICD-10-CM | POA: Diagnosis not present

## 2014-12-01 DIAGNOSIS — M4120 Other idiopathic scoliosis, site unspecified: Secondary | ICD-10-CM | POA: Diagnosis not present

## 2014-12-01 DIAGNOSIS — Z8659 Personal history of other mental and behavioral disorders: Secondary | ICD-10-CM | POA: Diagnosis not present

## 2014-12-01 DIAGNOSIS — R079 Chest pain, unspecified: Secondary | ICD-10-CM

## 2014-12-01 DIAGNOSIS — R110 Nausea: Secondary | ICD-10-CM | POA: Insufficient documentation

## 2014-12-01 DIAGNOSIS — Z8709 Personal history of other diseases of the respiratory system: Secondary | ICD-10-CM | POA: Insufficient documentation

## 2014-12-01 NOTE — ED Notes (Signed)
Pt reports onset of chest pain 2 hours ago while she was driving home from work. Pt reports her pain started in her right shoulder and then radiated across her chest. PT reports pain behind her left shoulder currently. Pt denies SOB, reports associated dizziness and nausea. A&O X4.

## 2014-12-01 NOTE — ED Provider Notes (Signed)
CSN: [id number]     Arrival date & time 12/01/14  2315 History  This chart was scribed for Mirian MoMatthew Gizella [place], MD by Evon Slackerrance Branch, ED Scribe. This patient was seen in room A06C/A06C and the patient's care was started at 11:42 PM.      Chief Complaint  Patient presents with  . Chest Pain    Patient is a 44 y.o. female presenting with chest pain. The history is provided by the patient. No language interpreter was used.  Chest Pain Pain location:  Substernal area Pain radiates to:  L shoulder and R shoulder Pain severity:  Mild Duration:  2 hours Chronicity:  New Context: no stress   Relieved by:  None tried Worsened by:  Nothing tried Ineffective treatments:  None tried Associated symptoms: nausea   Associated symptoms: no cough, no fever, no shortness of breath and not vomiting   Risk factors: diabetes mellitus    HPI Comments: Selena Holt is a 44 y.o. female who presents to the Emergency Department complaining of CP onset 2 hours PTA while driving home from work. Pt states that the pain initially began in her right shoulder radiated to her chest then to her left shoulder. Pt states during the onset of pain she felt nauseous. Pt denies any worsening symptoms. Denies SOB, diarrhea, constipation, fever or cough.  Pt reports Hx of DM and states that her sugars ar well controlled. Pt states her father has a Hx of CAD at the age of 43.   Past Medical History  Diagnosis Date  . Hypertension   . Bipolar disorder   . HSV-1 (herpes simplex virus 1) infection   . Diabetes mellitus without complication   . Collapsed lung     "spontaneous" , teenager  . Scoliosis (and kyphoscoliosis), idiopathic   . Anemia    No past surgical history on file. Family History  Problem Relation Age of Onset  . Hyperlipidemia Mother   . Hypertension Mother   . Hyperlipidemia Father   . Hypertension Father   . CAD Father   . Heart disease Father    History  Substance Use Topics  . Smoking status:  Never Smoker   . Smokeless tobacco: Not on file  . Alcohol Use: No     Comment: occ   OB History    No data available      Review of Systems  Constitutional: Negative for fever.  Respiratory: Negative for cough and shortness of breath.   Cardiovascular: Positive for chest pain.  Gastrointestinal: Positive for nausea. Negative for vomiting and diarrhea.  All other systems reviewed and are negative.    Allergies  Tetanus toxoids; Ceclor; Penicillins; and Sulfa antibiotics  Home Medications   Prior to Admission medications   Medication Sig Start Date End Date Taking? Authorizing Provider  Cholecalciferol (D3 MAXIMUM STRENGTH) 5000 UNIT/ML LIQD Take 1 mL (5,000 Units total) by mouth daily with supper. 09/28/14  Yes Kelle DartingLayne C Weaver, NP  ferrous gluconate (FERGON) 324 MG tablet Take 1 tablet (324 mg total) by mouth daily. 11/02/14  Yes Kelle DartingLayne C Weaver, NP  levonorgestrel (MIRENA) 20 MCG/24HR IUD 1 each by Intrauterine route once.   Yes Historical Provider, MD  metFORMIN (GLUCOPHAGE) 500 MG tablet Take 1 tablet (500 mg total) by mouth daily with breakfast. 11/02/14 02/01/15 Yes Kelle DartingLayne C Weaver, NP  Cyanocobalamin (VITAMIN B-12 PO) Take 1,200 mcg by mouth daily.    Historical Provider, MD   BP 130/74 mmHg  Pulse 85  Temp(Src) 98.1  F (36.7 C) (Oral)  Resp 16  Ht 5\' 5"  (1.651 m)  Wt 145 lb (65.772 kg)  BMI 24.13 kg/m2  SpO2 99%   Physical Exam  Constitutional: She is oriented to person, place, and time. She appears well-developed and well-nourished.  HENT:  Head: Normocephalic and atraumatic.  Right Ear: External ear normal.  Left Ear: External ear normal.  Eyes: Conjunctivae and EOM are normal. Pupils are equal, round, and reactive to light.  Neck: Normal range of motion. Neck supple.  Cardiovascular: Normal rate, regular rhythm, normal heart sounds and intact distal pulses.   Pulmonary/Chest: Effort normal and breath sounds normal.  Abdominal: Soft. Bowel sounds are normal.  There is no tenderness.  Musculoskeletal: Normal range of motion.  Neurological: She is alert and oriented to person, place, and time.  Skin: Skin is warm and dry.  Vitals reviewed.   ED Course  Procedures (including critical care time) DIAGNOSTIC STUDIES: Oxygen Saturation is 100% on RA, normal by my interpretation.    COORDINATION OF CARE: 11:51 PM-Discussed treatment plan with pt at bedside and pt agreed to plan.     Labs Review Labs Reviewed  CBC - Abnormal; Notable for the following:    WBC 16.0 (*)    RBC 3.84 (*)    Hemoglobin 11.8 (*)    HCT 34.6 (*)    All other components within normal limits  BASIC METABOLIC PANEL - Abnormal; Notable for the following:    Glucose, Bld 123 (*)    All other components within normal limits  BRAIN NATRIURETIC PEPTIDE  URINALYSIS, ROUTINE W REFLEX MICROSCOPIC  I-STAT TROPOININ, ED  POC URINE PREG, ED  Rosezena SensorI-STAT TROPOININ, ED    Imaging Review Dg Chest 2 View  12/02/2014   CLINICAL DATA:  Chest pain, upper back pain, and shoulder pain for 1 day.  EXAM: CHEST  2 VIEW  COMPARISON:  06/01/2012  FINDINGS: Mild thoracic kyphoscoliosis convex towards the left. Normal heart size and pulmonary vascularity. No focal airspace disease or consolidation in the lungs. No blunting of costophrenic angles. No pneumothorax. Mediastinal contours appear intact.  IMPRESSION: No active cardiopulmonary disease.   Electronically Signed   By: Burman NievesWilliam  Stevens M.D.   On: 12/02/2014 00:25     EKG Interpretation   Date/Time:  Friday Dec 01 2014 23:20:05 EDT Ventricular Rate:  78 PR Interval:  120 QRS Duration: 74 QT Interval:  380 QTC Calculation: 433 R Axis:   18 Text Interpretation:  Normal sinus rhythm Normal ECG rate has decreased  since last tracing Confirmed by Mirian MoGentry, Nachum Derossett [phone] on 12/01/2014  11:43:59 PM      MDM   Final diagnoses:  Chest pain at rest      44 y.o. female with pertinent PMH of DM, prior HTN, family ho heart dz  presents with chest pain as above.  History atypical, migrating, but occurring at rest and pressure like with associated nausea.  Physical exam benign.  Wu, including cardiac wu, unremarkable.  I discussed concerning nature of findings with pt and admission, she flatly refused.  Given that delta trop is negative and pt is low risk via HEART score, I spoke with cardiology to arrange for fu and pt will fu soon.  DC home in stable condition..    I have reviewed all laboratory and imaging studies if ordered as above  1. Chest pain at rest           Mirian MoMatthew Trust Leh, MD 12/02/14 [phone]

## 2014-12-02 ENCOUNTER — Other Ambulatory Visit: Payer: Self-pay | Admitting: Family Medicine

## 2014-12-02 ENCOUNTER — Emergency Department (HOSPITAL_COMMUNITY): Payer: BLUE CROSS/BLUE SHIELD

## 2014-12-02 LAB — BASIC METABOLIC PANEL
Anion gap: 13 (ref 5–15)
BUN: 7 mg/dL (ref 6–20)
CO2: 23 mmol/L (ref 22–32)
Calcium: 9.6 mg/dL (ref 8.9–10.3)
Chloride: 103 mmol/L (ref 101–111)
Creatinine, Ser: 0.61 mg/dL (ref 0.44–1.00)
GFR calc Af Amer: >60 mL/min (ref >60)
GFR calc non Af Amer: >60 mL/min (ref >60)
Glucose, Bld: 123 mg/dL — ABNORMAL HIGH (ref 65–99)
Potassium: 3.7 mmol/L (ref 3.5–5.1)
Sodium: 139 mmol/L (ref 135–145)

## 2014-12-02 LAB — URINALYSIS, ROUTINE W REFLEX MICROSCOPIC
Bilirubin Urine: NEGATIVE
Glucose, UA: NEGATIVE mg/dL
Hgb urine dipstick: NEGATIVE
Ketones, ur: NEGATIVE mg/dL
Leukocytes, UA: NEGATIVE
Nitrite: NEGATIVE
Protein, ur: NEGATIVE mg/dL
Specific Gravity, Urine: 1.01 (ref 1.005–1.030)
Urobilinogen, UA: 0.2 mg/dL (ref 0.0–1.0)
pH: 5.5 (ref 5.0–8.0)

## 2014-12-02 LAB — CBC
HCT: 34.6 % — ABNORMAL LOW (ref 36.0–46.0)
Hemoglobin: 11.8 g/dL — ABNORMAL LOW (ref 12.0–15.0)
MCH: 30.7 pg (ref 26.0–34.0)
MCHC: 34.1 g/dL (ref 30.0–36.0)
MCV: 90.1 fL (ref 78.0–100.0)
Platelets: 271 10*3/uL (ref 150–400)
RBC: 3.84 MIL/uL — ABNORMAL LOW (ref 3.87–5.11)
RDW: 13.5 % (ref 11.5–15.5)
WBC: 16 10*3/uL — ABNORMAL HIGH (ref 4.0–10.5)

## 2014-12-02 LAB — I-STAT TROPONIN, ED
Troponin i, poc: 0 ng/mL (ref 0.00–0.08)
Troponin i, poc: 0 ng/mL (ref 0.00–0.08)

## 2014-12-02 LAB — BRAIN NATRIURETIC PEPTIDE: B Natriuretic Peptide: 11.9 pg/mL (ref 0.0–100.0)

## 2014-12-02 LAB — POC URINE PREG, ED: Preg Test, Ur: NEGATIVE

## 2014-12-02 MED ORDER — ASPIRIN 325 MG PO TABS: 325.0000 mg | ORAL_TABLET | Freq: Once | ORAL | Status: DC

## 2014-12-02 MED ORDER — NITROGLYCERIN 0.4 MG SL SUBL
0.4000 mg | SUBLINGUAL_TABLET | SUBLINGUAL | Status: DC | PRN
  Administered 2014-12-02: 0.4 mg via SUBLINGUAL
  Filled 2014-12-02: qty 1

## 2014-12-02 NOTE — ED Notes (Signed)
Dr Littie DeedsGentry expressed that he wanted the pt to take to the nitro, the pt agreed to take the nitro

## 2014-12-02 NOTE — Discharge Instructions (Signed)

## 2014-12-02 NOTE — ED Notes (Signed)
Pt verbalized understanding of d/c instructions and has no further questions.  

## 2014-12-02 NOTE — ED Notes (Signed)
Pt stated that she did not want to be admitted to the hospital, that she wanted to go home. Dr Littie DeedsGentry notified and came into the room to speak with the patient. Pt is adimate about going home and will follow up with a cardiologist.

## 2014-12-02 NOTE — ED Notes (Signed)
Pt stated that her pain has moved from her central chest to her lateral left breast. Pt states that she does not want nitro at this time.

## 2014-12-04 ENCOUNTER — Ambulatory Visit (INDEPENDENT_AMBULATORY_CARE_PROVIDER_SITE_OTHER): Payer: BLUE CROSS/BLUE SHIELD | Admitting: Family Medicine

## 2014-12-04 ENCOUNTER — Other Ambulatory Visit: Payer: Self-pay

## 2014-12-04 ENCOUNTER — Encounter: Payer: Self-pay | Admitting: Family Medicine

## 2014-12-04 VITALS — BP 119/80 | HR 89 | Temp 98.1°F | Resp 16 | Wt 148.0 lb

## 2014-12-04 DIAGNOSIS — F411 Generalized anxiety disorder: Secondary | ICD-10-CM

## 2014-12-04 DIAGNOSIS — F4322 Adjustment disorder with anxiety: Secondary | ICD-10-CM

## 2014-12-04 DIAGNOSIS — R0789 Other chest pain: Secondary | ICD-10-CM

## 2014-12-04 MED ORDER — BUSPIRONE HCL 15 MG PO TABS: 15.0000 mg | ORAL_TABLET | Freq: Two times a day (BID) | ORAL | Status: DC

## 2014-12-04 MED ORDER — GLUCOSE BLOOD VI STRP: ORAL_STRIP | Status: DC

## 2014-12-04 NOTE — Progress Notes (Signed)
Pre visit review using our clinic review tool, if applicable. No additional management support is needed unless otherwise documented below in the visit note. 

## 2014-12-04 NOTE — Progress Notes (Signed)
OFFICE VISIT  12/04/2014   CC:  Chief Complaint  Patient presents with  . Hospitalization Follow-up    ER f/u   HPI:    Patient is a 44 y.o. Caucasian female who presents for ED f/u: was seen in ED the evening of 12/02/14 for chest pain. I reviewed entire ED record today: blood testing neg, CXR normal, EKG normal.  Pt refused admission for further w/u. ED note says cardiology was spoken with and f/u as outpt was arranged--pt called for appt with them today and was told it will be at least 3 wks before she can get in to see cardiologist, so she decided to come here to discuss things, esp since she has been thinking more and more lately about how this all could be from anxiety. No meds rx'd in ED.  She has lots of anxiety lately related to her house (trying to sell), job stressful, one child leaving for college, one child coming home from college. Description of pain from ED visit: R shoulder pain , migrated to chest/squeezing and some nausea, then went to L shoulder.  Onset while driving, at rest, no relieved by anything specifically.  Has had brief sensation heart speeding up while laying in bed since going to ED but no more CP, no further nausea.  She does no exertion, no exercise.    Describes well controlled HTN and DM of late.  Says she has never been treated for anxiety.  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  No panic attacks.  No depressed mood, no euphoria or hypomania. No postprandial abd pain or n/v.  Denies typical GER sx's.  Past Medical History  Diagnosis Date  . Hypertension   . Bipolar disorder   . HSV-1 (herpes simplex virus 1) infection   . Diabetes mellitus without complication   . Collapsed lung     "spontaneous" , teenager  . Scoliosis (and kyphoscoliosis), idiopathic   . Anemia     No past surgical history on file.  Outpatient Prescriptions Prior to Visit  Medication Sig Dispense Refill  .  Cholecalciferol (D3 MAXIMUM STRENGTH) 5000 UNIT/ML LIQD Take 1 mL (5,000 Units total) by mouth daily with supper. 30 mL 2  . Cyanocobalamin (VITAMIN B-12 PO) Take 1,200 mcg by mouth daily.    . ferrous gluconate (FERGON) 324 MG tablet Take 1 tablet (324 mg total) by mouth daily. 30 tablet 3  . glucose blood (ONETOUCH VERIO) test strip TEST BLOOD SUGAR TWICE A DAY 100 each 12  . levonorgestrel (MIRENA) 20 MCG/24HR IUD 1 each by Intrauterine route once.    . metFORMIN (GLUCOPHAGE) 500 MG tablet Take 1 tablet (500 mg total) by mouth daily with breakfast. 30 tablet 5  . ONETOUCH VERIO test strip TEST BLOOD SUGAR TWICE A DAY 100 each 3   No facility-administered medications prior to visit.    Allergies  Allergen Reactions  . Tetanus Toxoids Anaphylaxis    tetanus  . Ceclor [Cefaclor] Hives  . Penicillins Hives  . Sulfa Antibiotics Hives   ROS As per HPI  PE: Blood pressure 119/80, pulse 89, temperature 98.1 F (36.7 C), temperature source Oral, resp. rate 16, weight 148 lb (67.132 kg), SpO2 100 %. Gen: Alert, well appearing.  Patient is oriented to person, place, time, and situation. ZOX:WRUEENT:Eyes: no injection, icteris, swelling, or exudate.  EOMI, PERRLA. Mouth: lips without lesion/swelling.  Oral mucosa pink and moist. Oropharynx  without erythema, exudate, or swelling.  necessary CV: RRR, no m/r/g.   LUNGS: CTA bilat, nonlabored resps, good aeration in all lung fields. EXT: no clubbing, cyanosis, or edema.    LABS:  None today   IMPRESSION AND PLAN:  1) Recent atypical chest pain, ED eval reassuring.  Pt low-to-intermediate risk for CAD. I feel like it is ok to hold off on any further risk stratification at this time, and pt agrees with this approach/prefers this approach at this time.  Strongly suspect her CP is anxiety-related.  2) GAD, with recent superimposed adjustment d/o with anxiety features: she really does not do well on antidepressants given her description of med  trials in the past.   Of note, she states she feels like she has no mood abnormalities since being OFF of her bipolar med for "a while". Would like to avoid benzo's for now. Will start trial of buspirone 15mg , 1/2 tab bid x 7d then increase to 1 tab bid until seen for f/u in office with either me or Layne in 3-4 wks.  An After Visit Summary was printed and given to the patient.  FOLLOW UP: Return for 3-4 wk f/u (with either me or Layne) anxiety and recent atypical chest pain.

## 2014-12-04 NOTE — Patient Instructions (Signed)
Start buspirone at HALF of a 15mg  tab twice a day for 7d, then increase to a whole tab twice a day. Stay at this dosing until f/u in office in 3-4 weeks.

## 2014-12-28 ENCOUNTER — Ambulatory Visit: Payer: Self-pay | Admitting: Nurse Practitioner

## 2015-01-03 ENCOUNTER — Encounter: Payer: Self-pay | Admitting: Nurse Practitioner

## 2015-01-03 ENCOUNTER — Ambulatory Visit (INDEPENDENT_AMBULATORY_CARE_PROVIDER_SITE_OTHER): Payer: BLUE CROSS/BLUE SHIELD | Admitting: Nurse Practitioner

## 2015-01-03 ENCOUNTER — Ambulatory Visit (HOSPITAL_BASED_OUTPATIENT_CLINIC_OR_DEPARTMENT_OTHER)
Admission: RE | Admit: 2015-01-03 | Discharge: 2015-01-03 | Disposition: A | Discharge status: Home / Self Care | Payer: BLUE CROSS/BLUE SHIELD | Source: Ambulatory Visit | Attending: Nurse Practitioner | Admitting: Nurse Practitioner

## 2015-01-03 VITALS — BP 135/89 | HR 100 | Temp 98.7°F | Resp 16 | Wt 148.0 lb

## 2015-01-03 DIAGNOSIS — R208 Other disturbances of skin sensation: Secondary | ICD-10-CM | POA: Diagnosis not present

## 2015-01-03 DIAGNOSIS — R200 Anesthesia of skin: Secondary | ICD-10-CM

## 2015-01-03 DIAGNOSIS — R202 Paresthesia of skin: Secondary | ICD-10-CM | POA: Insufficient documentation

## 2015-01-03 DIAGNOSIS — M8588 Other specified disorders of bone density and structure, other site: Secondary | ICD-10-CM | POA: Insufficient documentation

## 2015-01-03 DIAGNOSIS — D509 Iron deficiency anemia, unspecified: Secondary | ICD-10-CM | POA: Diagnosis not present

## 2015-01-03 DIAGNOSIS — E119 Type 2 diabetes mellitus without complications: Secondary | ICD-10-CM | POA: Diagnosis not present

## 2015-01-03 DIAGNOSIS — R740 Nonspecific elevation of levels of transaminase and lactic acid dehydrogenase [LDH]: Secondary | ICD-10-CM | POA: Diagnosis not present

## 2015-01-03 DIAGNOSIS — R7401 Elevation of levels of liver transaminase levels: Secondary | ICD-10-CM

## 2015-01-03 LAB — IRON AND TIBC
%SAT: 14 % — ABNORMAL LOW (ref 20–55)
Iron: 55 ug/dL (ref 42–145)
TIBC: 380 ug/dL (ref 250–470)
UIBC: 325 ug/dL (ref 125–400)

## 2015-01-03 MED ORDER — PREDNISONE 10 MG PO TABS: ORAL_TABLET | ORAL | Status: DC

## 2015-01-03 NOTE — Patient Instructions (Signed)
Start prednisone tomorrow morning.  Get xrays.  My office will call with lab results.  Continue metformin, iron tablet daily.  Please return in 2 weeks.

## 2015-01-03 NOTE — Progress Notes (Signed)
Subjective:     Selena Holt is a 44 y.o. female presents w/new c/o L arm numbness & tingling in fingers. She has DM2, iron def anemia & WBc was elevated at hospital encounter last mo. Arm onset 7 days ago. Strength & ROM not affected. Arm "feels like it is asleep" the intensity waxes & wanes. She has noticed that 4th & 5th fingers are more numb than other fingers. She denies neck & shoulder pain.  She was seen in ofc after ER visit for CP last mo. She had neg w/u in ER. CP was thought to be due to anxiety & she was started on buspar. She did not take this med. Rather, she went back to psychiatrist (Dr [place] Sink) who started her on lamictal & seroquel. She started lamictal only. She reports feeling less anxious. SHe has had cp since then: describes as "mild" last few seconds & sometimes accompanied by palpitations.  DM2: taking 500 mtformin qd. Denies symptoms. Last A1c had improved to 6.8. She is taking iron tabs daily w/glass OJ & vit C. Advised to eat fresh orange rather than drinking juice to reduce sugar.  The following portions of the patient's history were reviewed and updated as appropriate: allergies, current medications, past medical history, past social history, past surgical history and problem list.  Review of Systems Pertinent items are noted in HPI.    Objective:    BP 135/89 mmHg  Pulse 100  Temp(Src) 98.7 F (37.1 C) (Oral)  Resp 16  Wt 148 lb (67.132 kg)  SpO2 100% BP 135/89 mmHg  Pulse 100  Temp(Src) 98.7 F (37.1 C) (Oral)  Resp 16  Wt 148 lb (67.132 kg)  SpO2 100% General appearance: alert, cooperative, appears stated age and no distress Eyes: negative findings: lids and lashes normal and conjunctivae and sclerae normal Neck: no adenopathy, supple, symmetrical, trachea midline and thyroid not enlarged, symmetric, no tenderness/mass/nodules  Kyphosis. No spinal tenderness. FROM Neurologic: Mental status: Alert, oriented, thought content appropriate Motor: grossly  normal Coordination: normal Gait: Normal KQA:SUORVIFBP: FROM, no tenderness in L shoulder. ARM: color nml, pulses nml.      Assessment:Plan    1. Left arm numbness NEW DD: cervical radiculopthy - DG Cervical Spine Complete; Future - predniSONE (DELTASONE) 10 MG tablet; Take 4Tpo qam X 3d, then 3T po qam X 3d, then 2T po qd X 3d, then 1T po qam X 3d.  Dispense: 30 tablet; Refill: 0  2. Type 2 diabetes mellitus without complication No med changes Discussed best diet to keep BS low - Hemoglobin A1c  3. Anemia, iron deficiency Cont daily iron w/vit c - CBC - Iron and TIBC  4. Elevated alanine aminotransferase (ALT) level History of elevation w/psych meds - Comprehensive metabolic panel  F/u 2 weeks-review xrays, eval arm numbness

## 2015-01-03 NOTE — Progress Notes (Signed)
Pre visit review using our clinic review tool, if applicable. No additional management support is needed unless otherwise documented below in the visit note. 

## 2015-01-04 ENCOUNTER — Telehealth: Payer: Self-pay | Admitting: Nurse Practitioner

## 2015-01-04 DIAGNOSIS — M47812 Spondylosis without myelopathy or radiculopathy, cervical region: Secondary | ICD-10-CM

## 2015-01-04 LAB — HEMOGLOBIN A1C: Hgb A1c MFr Bld: 6.1 % (ref 4.6–6.5)

## 2015-01-04 LAB — COMPREHENSIVE METABOLIC PANEL
ALT: 28 U/L (ref 0–35)
AST: 16 U/L (ref 0–37)
Albumin: 5 g/dL (ref 3.5–5.2)
Alkaline Phosphatase: 86 U/L (ref 39–117)
BUN: 7 mg/dL (ref 6–23)
CO2: 27 mEq/L (ref 19–32)
Calcium: 9.9 mg/dL (ref 8.4–10.5)
Chloride: 102 mEq/L (ref 96–112)
Creatinine, Ser: 0.64 mg/dL (ref 0.40–1.20)
GFR: 107.91 mL/min (ref >60.00)
Glucose, Bld: 116 mg/dL — ABNORMAL HIGH (ref 70–99)
Potassium: 3.9 mEq/L (ref 3.5–5.1)
Sodium: 136 mEq/L (ref 135–145)
Total Bilirubin: 0.5 mg/dL (ref 0.2–1.2)
Total Protein: 7.3 g/dL (ref 6.0–8.3)

## 2015-01-04 LAB — CBC
HCT: 37.9 % (ref 36.0–46.0)
Hemoglobin: 12.6 g/dL (ref 12.0–15.0)
MCHC: 33.2 g/dL (ref 30.0–36.0)
MCV: 92.6 fl (ref 78.0–100.0)
Platelets: 309 10*3/uL (ref 150.0–400.0)
RBC: 4.09 Mil/uL (ref 3.87–5.11)
RDW: 13.8 % (ref 11.5–15.5)
WBC: 13.7 10*3/uL — ABNORMAL HIGH (ref 4.0–10.5)

## 2015-01-04 NOTE — Telephone Encounter (Signed)
xrays show deg changes in c-s & thinning of bones-she has been on lamictal for several years, has had recent drug holiday-her choice-for about 1 yr. Pt does not want to take prednisone, prefers to see spine specialists. Will order DEXA to eval bone changes. A1c looks GREAT! No changes. Will check again in 3 mos, if A1C still under 6.5 will stop metformin. WBC trending down. Should continue to monitor. If remains elevated in absence of infection will ref to onc. Answered all qtns.

## 2015-01-05 ENCOUNTER — Encounter: Payer: Self-pay | Admitting: Nurse Practitioner

## 2015-01-15 ENCOUNTER — Other Ambulatory Visit: Payer: Self-pay

## 2015-01-17 ENCOUNTER — Ambulatory Visit: Payer: BLUE CROSS/BLUE SHIELD | Admitting: Nurse Practitioner

## 2015-01-31 ENCOUNTER — Encounter: Payer: Self-pay | Admitting: Nurse Practitioner

## 2015-03-05 ENCOUNTER — Other Ambulatory Visit: Payer: Self-pay | Admitting: Family Medicine

## 2015-03-05 DIAGNOSIS — D509 Iron deficiency anemia, unspecified: Secondary | ICD-10-CM

## 2015-03-05 MED ORDER — FERROUS GLUCONATE 324 (38 FE) MG PO TABS: 324.0000 mg | ORAL_TABLET | Freq: Every day | ORAL | Status: DC

## 2015-03-05 NOTE — Telephone Encounter (Signed)
Patient requesting rf of ferrous gluconate .  Please advise.

## 2015-04-13 ENCOUNTER — Telehealth: Payer: Self-pay | Admitting: Family Medicine

## 2015-04-13 ENCOUNTER — Ambulatory Visit (INDEPENDENT_AMBULATORY_CARE_PROVIDER_SITE_OTHER): Payer: 59 | Admitting: Family Medicine

## 2015-04-13 ENCOUNTER — Encounter: Payer: Self-pay | Admitting: Family Medicine

## 2015-04-13 VITALS — BP 123/83 | HR 91 | Temp 98.4°F | Resp 18 | Ht 64.5 in | Wt 143.0 lb

## 2015-04-13 DIAGNOSIS — Z23 Encounter for immunization: Secondary | ICD-10-CM | POA: Diagnosis not present

## 2015-04-13 DIAGNOSIS — E119 Type 2 diabetes mellitus without complications: Secondary | ICD-10-CM

## 2015-04-13 DIAGNOSIS — Z0000 Encounter for general adult medical examination without abnormal findings: Secondary | ICD-10-CM

## 2015-04-13 DIAGNOSIS — R358 Other polyuria: Secondary | ICD-10-CM

## 2015-04-13 DIAGNOSIS — R3589 Other polyuria: Secondary | ICD-10-CM | POA: Insufficient documentation

## 2015-04-13 LAB — POCT URINALYSIS DIPSTICK
Bilirubin, UA: NEGATIVE
Blood, UA: NEGATIVE
Glucose, UA: NEGATIVE
Ketones, UA: NEGATIVE
Leukocytes, UA: NEGATIVE
Nitrite, UA: NEGATIVE
Protein, UA: 30
Spec Grav, UA: 1.025
Urobilinogen, UA: 1
pH, UA: 7

## 2015-04-13 LAB — POCT GLYCOSYLATED HEMOGLOBIN (HGB A1C): Hemoglobin A1C: 6

## 2015-04-13 MED ORDER — METFORMIN HCL 500 MG PO TABS: 500.0000 mg | ORAL_TABLET | Freq: Every day | ORAL | Status: DC

## 2015-04-13 NOTE — Telephone Encounter (Signed)
Patient aware of results and plan.  No questions at this time.   

## 2015-04-13 NOTE — Patient Instructions (Signed)
Health Maintenance Adopting a healthy lifestyle and getting preventive care can go a long way to promote health and wellness. Talk with your health care provider about what schedule of regular examinations is right for you. This is a good chance for you to check in with your provider about disease prevention and staying healthy. In between checkups, there are plenty of things you can do on your own. Experts have done a lot of research about which lifestyle changes and preventive measures are most likely to keep you healthy. Ask your health care provider for more information. WEIGHT AND DIET  Eat a healthy diet  Be sure to include plenty of vegetables, fruits, low-fat dairy products, and lean protein.  Do not eat a lot of foods high in solid fats, added sugars, or salt.  Get regular exercise. This is one of the most important things you can do for your health.  Most adults should exercise for at least 150 minutes each week. The exercise should increase your heart rate and make you sweat (moderate-intensity exercise).  Most adults should also do strengthening exercises at least twice a week. This is in addition to the moderate-intensity exercise.  Maintain a healthy weight  Body mass index (BMI) is a measurement that can be used to identify possible weight problems. It estimates body fat based on height and weight. Your health care provider can help determine your BMI and help you achieve or maintain a healthy weight.  For females 25 years of age and older:   A BMI below 18.5 is considered underweight.  A BMI of 18.5 to 24.9 is normal.  A BMI of 25 to 29.9 is considered overweight.  A BMI of 30 and above is considered obese.  Watch levels of cholesterol and blood lipids  You should start having your blood tested for lipids and cholesterol at 43 years of age, then have this test every 5 years.  You may need to have your cholesterol levels checked more often if:  Your lipid or  cholesterol levels are high.  You are older than 43 years of age.  You are at high risk for heart disease.  CANCER SCREENING   Lung Cancer  Lung cancer screening is recommended for adults 97-92 years old who are at high risk for lung cancer because of a history of smoking.  A yearly low-dose CT scan of the lungs is recommended for people who:  Currently smoke.  Have quit within the past 15 years.  Have at least a 30-pack-year history of smoking. A pack year is smoking an average of one pack of cigarettes a day for 1 year.  Yearly screening should continue until it has been 15 years since you quit.  Yearly screening should stop if you develop a health problem that would prevent you from having lung cancer treatment.  Breast Cancer  Practice breast self-awareness. This means understanding how your breasts normally appear and feel.  It also means doing regular breast self-exams. Let your health care provider know about any changes, no matter how small.  If you are in your 20s or 30s, you should have a clinical breast exam (CBE) by a health care provider every 1-3 years as part of a regular health exam.  If you are 76 or older, have a CBE every year. Also consider having a breast X-ray (mammogram) every year.  If you have a family history of breast cancer, talk to your health care provider about genetic screening.  If you are  at high risk for breast cancer, talk to your health care provider about having an MRI and a mammogram every year.  Breast cancer gene (BRCA) assessment is recommended for women who have family members with BRCA-related cancers. BRCA-related cancers include:  Breast.  Ovarian.  Tubal.  Peritoneal cancers.  Results of the assessment will determine the need for genetic counseling and BRCA1 and BRCA2 testing. Cervical Cancer Routine pelvic examinations to screen for cervical cancer are no longer recommended for nonpregnant women who are considered low  risk for cancer of the pelvic organs (ovaries, uterus, and vagina) and who do not have symptoms. A pelvic examination may be necessary if you have symptoms including those associated with pelvic infections. Ask your health care provider if a screening pelvic exam is right for you.   The Pap test is the screening test for cervical cancer for women who are considered at risk.  If you had a hysterectomy for a problem that was not cancer or a condition that could lead to cancer, then you no longer need Pap tests.  If you are older than 65 years, and you have had normal Pap tests for the past 10 years, you no longer need to have Pap tests.  If you have had past treatment for cervical cancer or a condition that could lead to cancer, you need Pap tests and screening for cancer for at least 20 years after your treatment.  If you no longer get a Pap test, assess your risk factors if they change (such as having a new sexual partner). This can affect whether you should start being screened again.  Some women have medical problems that increase their chance of getting cervical cancer. If this is the case for you, your health care provider may recommend more frequent screening and Pap tests.  The human papillomavirus (HPV) test is another test that may be used for cervical cancer screening. The HPV test looks for the virus that can cause cell changes in the cervix. The cells collected during the Pap test can be tested for HPV.  The HPV test can be used to screen women 30 years of age and older. Getting tested for HPV can extend the interval between normal Pap tests from three to five years.  An HPV test also should be used to screen women of any age who have unclear Pap test results.  After 43 years of age, women should have HPV testing as often as Pap tests.  Colorectal Cancer  This type of cancer can be detected and often prevented.  Routine colorectal cancer screening usually begins at 43 years of  age and continues through 43 years of age.  Your health care provider may recommend screening at an earlier age if you have risk factors for colon cancer.  Your health care provider may also recommend using home test kits to check for hidden blood in the stool.  A small camera at the end of a tube can be used to examine your colon directly (sigmoidoscopy or colonoscopy). This is done to check for the earliest forms of colorectal cancer.  Routine screening usually begins at age 50.  Direct examination of the colon should be repeated every 5-10 years through 43 years of age. However, you may need to be screened more often if early forms of precancerous polyps or small growths are found. Skin Cancer  Check your skin from head to toe regularly.  Tell your health care provider about any new moles or changes in   moles, especially if there is a change in a mole's shape or color.  Also tell your health care provider if you have a mole that is larger than the size of a pencil eraser.  Always use sunscreen. Apply sunscreen liberally and repeatedly throughout the day.  Protect yourself by wearing long sleeves, pants, a wide-brimmed hat, and sunglasses whenever you are outside. HEART DISEASE, DIABETES, AND HIGH BLOOD PRESSURE   Have your blood pressure checked at least every 1-2 years. High blood pressure causes heart disease and increases the risk of stroke.  If you are between 75 years and 42 years old, ask your health care provider if you should take aspirin to prevent strokes.  Have regular diabetes screenings. This involves taking a blood sample to check your fasting blood sugar level.  If you are at a normal weight and have a low risk for diabetes, have this test once every three years after 43 years of age.  If you are overweight and have a high risk for diabetes, consider being tested at a younger age or more often. PREVENTING INFECTION  Hepatitis B  If you have a higher risk for  hepatitis B, you should be screened for this virus. You are considered at high risk for hepatitis B if:  You were born in a country where hepatitis B is common. Ask your health care provider which countries are considered high risk.  Your parents were born in a high-risk country, and you have not been immunized against hepatitis B (hepatitis B vaccine).  You have HIV or AIDS.  You use needles to inject street drugs.  You live with someone who has hepatitis B.  You have had sex with someone who has hepatitis B.  You get hemodialysis treatment.  You take certain medicines for conditions, including cancer, organ transplantation, and autoimmune conditions. Hepatitis C  Blood testing is recommended for:  Everyone born from [birth year] through 1964.  Anyone with known risk factors for hepatitis C. Sexually transmitted infections (STIs)  You should be screened for sexually transmitted infections (STIs) including gonorrhea and chlamydia if:  You are sexually active and are younger than 43 years of age.  You are older than 43 years of age and your health care provider tells you that you are at risk for this type of infection.  Your sexual activity has changed since you were last screened and you are at an increased risk for chlamydia or gonorrhea. Ask your health care provider if you are at risk.  If you do not have HIV, but are at risk, it may be recommended that you take a prescription medicine daily to prevent HIV infection. This is called pre-exposure prophylaxis (PrEP). You are considered at risk if:  You are sexually active and do not regularly use condoms or know the HIV status of your partner(s).  You take drugs by injection.  You are sexually active with a partner who has HIV. Talk with your health care provider about whether you are at high risk of being infected with HIV. If you choose to begin PrEP, you should first be tested for HIV. You should then be tested every 3 months for  as long as you are taking PrEP.  PREGNANCY   If you are premenopausal and you may become pregnant, ask your health care provider about preconception counseling.  If you may become pregnant, take 400 to 800 micrograms (mcg) of folic acid every day.  If you want to prevent pregnancy, talk to your  health care provider about birth control (contraception). OSTEOPOROSIS AND MENOPAUSE   Osteoporosis is a disease in which the bones lose minerals and strength with aging. This can result in serious bone fractures. Your risk for osteoporosis can be identified using a bone density scan.  If you are 25 years of age or older, or if you are at risk for osteoporosis and fractures, ask your health care provider if you should be screened.  Ask your health care provider whether you should take a calcium or vitamin D supplement to lower your risk for osteoporosis.  Menopause may have certain physical symptoms and risks.  Hormone replacement therapy may reduce some of these symptoms and risks. Talk to your health care provider about whether hormone replacement therapy is right for you.  HOME CARE INSTRUCTIONS   Schedule regular health, dental, and eye exams.  Stay current with your immunizations.   Do not use any tobacco products including cigarettes, chewing tobacco, or electronic cigarettes.  If you are pregnant, do not drink alcohol.  If you are breastfeeding, limit how much and how often you drink alcohol.  Limit alcohol intake to no more than 1 drink per day for nonpregnant women. One drink equals 12 ounces of beer, 5 ounces of wine, or 1 ounces of hard liquor.  Do not use street drugs.  Do not share needles.  Ask your health care provider for help if you need support or information about quitting drugs.  Tell your health care provider if you often feel depressed.  Tell your health care provider if you have ever been abused or do not feel safe at home. Document Released: 01/20/2011  Document Revised: 11/21/2013 Document Reviewed: 06/08/2013 [facility] Patient Information 2015 [place], [place]. This information is not intended to replace advice given to you by your health care provider. Make sure you discuss any questions you have with your health care provider.  Diabetes and Exercise Exercising regularly is important. It is not just about losing weight. It has many health benefits, such as:  Improving your overall fitness, flexibility, and endurance.  Increasing your bone density.  Helping with weight control.  Decreasing your body fat.  Increasing your muscle strength.  Reducing stress and tension.  Improving your overall health. People with diabetes who exercise gain additional benefits because exercise:  Reduces appetite.  Improves the body's use of blood sugar (glucose).  Helps lower or control blood glucose.  Decreases blood pressure.  Helps control blood lipids (such as cholesterol and triglycerides).  Improves the body's use of the hormone insulin by:  Increasing the body's insulin sensitivity.  Reducing the body's insulin needs.  Decreases the risk for heart disease because exercising:  Lowers cholesterol and triglycerides levels.  Increases the levels of good cholesterol (such as high-density lipoproteins [HDL]) in the body.  Lowers blood glucose levels. YOUR ACTIVITY PLAN  Choose an activity that you enjoy and set realistic goals. Your health care provider or diabetes educator can help you make an activity plan that works for you. Exercise regularly as directed by your health care provider. This includes:  Performing resistance training twice a week such as push-ups, sit-ups, lifting weights, or using resistance bands.  Performing 150 minutes of cardio exercises each week such as walking, running, or playing sports.  Staying active and spending no more than 90 minutes at one time being inactive. Even short bursts of exercise are good  for you. Three 10-minute sessions spread throughout the day are just as beneficial as a single  30-minute session. Some exercise ideas include:  Taking the dog for a walk.  Taking the stairs instead of the elevator.  Dancing to your favorite song.  Doing an exercise video.  Doing your favorite exercise with a friend. RECOMMENDATIONS FOR EXERCISING WITH TYPE 1 OR TYPE 2 DIABETES   Check your blood glucose before exercising. If blood glucose levels are greater than 240 mg/dL, check for urine ketones. Do not exercise if ketones are present.  Avoid injecting insulin into areas of the body that are going to be exercised. For example, avoid injecting insulin into:  The arms when playing tennis.  The legs when jogging.  Keep a record of:  Food intake before and after you exercise.  Expected peak times of insulin action.  Blood glucose levels before and after you exercise.  The type and amount of exercise you have done.  Review your records with your health care provider. Your health care provider will help you to develop guidelines for adjusting food intake and insulin amounts before and after exercising.  If you take insulin or oral hypoglycemic agents, watch for signs and symptoms of hypoglycemia. They include:  Dizziness.  Shaking.  Sweating.  Chills.  Confusion.  Drink plenty of water while you exercise to prevent dehydration or heat stroke. Body water is lost during exercise and must be replaced.  Talk to your health care provider before starting an exercise program to make sure it is safe for you. Remember, almost any type of activity is better than none. Document Released: 09/27/2003 Document Revised: 11/21/2013 Document Reviewed: 12/14/2012 [facility] Patient Information 2015 [place], [place]. This information is not intended to replace advice given to you by your health care provider. Make sure you discuss any questions you have with your health care provider.  Flu  shot given today. I will call you with a1c results and urine results.  F/U in 6 months, or sooner if needed.

## 2015-04-13 NOTE — Telephone Encounter (Signed)
Please call pt:  - urine appeared normal. No signs of infection. - A1c was 6.0 today. That is good. We will keep medicine as is. I have called in refills for her. Follow up in 4-6 months.

## 2015-04-13 NOTE — Progress Notes (Signed)
Subjective:    Patient ID: Selena Holt, female    DOB: [birth year], 44 y.o.   MRN: [id number]  HPI Patient presents office visit for her preventative care /annual.  Bipolar/depression:  Patient has history of bipolar and depression, she was diagnosed approximately 8 years ago. She has been evaluated by psychiatry , but her insurance has recently changed in her prior psychiatrist no longer takes her insurance. Patient states that she does not need a referral for psychiatry, but was wondering if we could help assist her in finding a female provider. She is currently not on any medications , but feels like her depression is starting to return and think she may need to start on medications again. She has taken Lamictal, Zoloft and Abilify that she recalls in the past. She has concerns about weight gain, as she has lost 23 pounds since stopping the medications and her diabetes overall health has improved. She is desiring a medication that hopefully will not cause her to gain weight. Patient feels stable today, but she would like to get reestablished with a new psychiatrist.  Diabetes mellitus type 2 , controlled:  Patient states that she takes metformin 500 mg daily , and is tolerating medication well.  She does admit to frequency of urination throughout the day. Stating that she gets up at least 4 to 6 times a day to urinate. She denies any nocturia , but states that she does get up in the morning in urinates quite a bit. She denies any burning with urination. She denies any fever , chills, nausea, vomiting , diarrhea or low back pain. She does not routinely take her blood sugar. She denies any hypo-or hyperglycemic events, she denies any numbness or tingling in her extremities, she denies any nonhealing wounds. She is overdue for her diabetic foot exam. She has her ophthalmology exam this month. All prior ophthalmology exams have been  Without diabetic retinopathy.  Health maintenance:  Colonoscopy:   No family history, routine screening to start at age 42 Mammogram:  No family history, routine screening start at age 89-50 Cervical cancer screening: up-to-date, last 2016 normal, every 3 years Immunizations:  Flu shot due today, PCV 13 to be offered.   No tetanus vaccination secondary to allergy. Infectious disease screening:  HIV to be offered  Never smoker Past Medical History  Diagnosis Date  . Hypertension   . Bipolar disorder   . HSV-1 (herpes simplex virus 1) infection   . Diabetes mellitus without complication   . Collapsed lung     "spontaneous" , teenager  . Scoliosis (and kyphoscoliosis), idiopathic   . Anemia    Allergies  Allergen Reactions  . Tetanus Toxoids Anaphylaxis    tetanus  . Ceclor [Cefaclor] Hives  . Penicillins Hives  . Sulfa Antibiotics Hives   Past Surgical History  Procedure Laterality Date  . Chest tube insertion     Family History  Problem Relation Age of Onset  . Hyperlipidemia Mother   . Hypertension Mother   . Hyperlipidemia Father   . Hypertension Father   . CAD Father   . Heart disease Father    Social History   Social History  . Marital Status: Single    Spouse Name: N/A  . Number of Children: 2  . Years of Education: N/A   Occupational History  . clerical     [employer]   Social History Main Topics  . Smoking status: Never Smoker   . Smokeless tobacco: Not  on file  . Alcohol Use: No     Comment: occ  . Drug Use: No  . Sexual Activity: No   Other Topics Concern  . Not on file   Social History Narrative   Ms. Hascall lives with her 2 teenage children in [place]. 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    Review of Systems  Constitutional: Negative for fever, activity change, appetite change, fatigue and unexpected weight change.  HENT: Negative for facial swelling, hearing loss, sinus pressure, tinnitus, trouble swallowing and voice change.   Eyes: Negative for photophobia and visual disturbance.    Respiratory: Negative for choking, shortness of breath and wheezing.   Cardiovascular: Negative for chest pain, palpitations and leg swelling.  Gastrointestinal: Negative for nausea, vomiting, diarrhea, constipation, blood in stool and anal bleeding.  Endocrine: Positive for polyuria. Negative for polydipsia and polyphagia.  Genitourinary: Positive for frequency. Negative for dysuria, hematuria, flank pain, decreased urine volume, vaginal pain and pelvic pain.  Musculoskeletal: Negative for myalgias and arthralgias.  Skin: Negative for pallor, rash and wound.  Neurological: Negative for dizziness, tremors, syncope, weakness, numbness and headaches.  Hematological: Negative for adenopathy.  Psychiatric/Behavioral: Negative for suicidal ideas, hallucinations, behavioral problems, confusion, decreased concentration and agitation. The patient is not nervous/anxious and is not hyperactive.       Objective:   Physical Exam BP 123/83 mmHg  Pulse 91  Temp(Src) 98.4 F (36.9 C) (Temporal)  Resp 18  Ht 5' 4.5" (1.638 m)  Wt 143 lb (64.864 kg)  BMI 24.18 kg/m2  SpO2 100% Gen: Afebrile. No acute distress.  Nontoxic in appearance, well-developed, well-nourished Caucasian female. Pleasant. HENT: AT. [place]. Bilateral TM visualized and normal in appearance. MMM. Bilateral nares  No erythema or swelling. Throat without erythema or exudates.  Eyes:Pupils Equal Round Reactive to light, Extraocular movements intact,  Conjunctiva without redness, discharge or icterus. Neck/lymp/endocrine: Supple, no lymphadenopathy,  no thyromegaly CV: RRR  No murmur appreciated,  noedema, +2/4 P posterior tibialis pulses Chest: CTAB, no wheeze or crackles Abd: Soft.   flat. NTND. BS  present.  no Masses palpated.  Skin:  no rashes, purpura or petechiae.  Neuro:  Normal gait. PERLA. EOMi. Alert. Oriented x3.  Cranial nerves II through XII intact. Muscle strength 5/5  Upper and lower extremity. DTRs equal  bilaterally. Psych: Normal affect, dress and demeanor. Normal speech. Normal thought content and judgment..     Assessment & Plan:  Polyuria: - POCT Urinalysis Dipstick-->  Negative today. Possibly due to diabetes or bladder irritation. Discussed common causes of bladder irritation including elevated glucose, carbonated beverages, citrus drinks with patient today.  Diabetes type 2, controlled - Polyuria - POCT glycosylated hemoglobin (Hb A1C) -  Continue metformin 500 mg, patient will be called with results of her A1c. Medication regimen will be changed if appropriate. -  If A1c in range, repeat A1c in follow-up in 6 months. If not in range we'll see back in 3 months  4. Encounter for preventive health examination Colonoscopy:  No family history, routine screening to start at age 78 Mammogram:  No family history, routine screening start at age 17-50 Cervical cancer screening: up-to-date, last 2016 normal, every 3 years Immunizations:  Flu shot administered today, PCV 13   To be offered for next appointment. Infectious disease screening:  HIV to be offered with Pap , patient states she's had HIV testing prior. No tetanus vaccination secondary to allergy.   3-6 months depending upon A1c.

## 2015-04-19 ENCOUNTER — Ambulatory Visit (INDEPENDENT_AMBULATORY_CARE_PROVIDER_SITE_OTHER): Payer: 59 | Admitting: Psychology

## 2015-04-19 DIAGNOSIS — F411 Generalized anxiety disorder: Secondary | ICD-10-CM | POA: Diagnosis not present

## 2015-04-27 ENCOUNTER — Telehealth: Payer: Self-pay | Admitting: Family Medicine

## 2015-04-27 NOTE — Telephone Encounter (Signed)
Please advise 

## 2015-04-27 NOTE — Telephone Encounter (Signed)
Patient's daughter called in requesting neurology referral for patient. She feels like she is suffering dementia.

## 2015-05-01 NOTE — Telephone Encounter (Signed)
Received a message that patient's daughter is requesting a neurology referral for the patient. There is some concerns about early dementia. I would encourage patient/daughter to schedule appointment, and we will evaluate patient for dementia. It would be helpful with her daughter was present during this appointment.

## 2015-05-01 NOTE — Telephone Encounter (Signed)
LMOM for pt to CB.  

## 2015-05-01 NOTE — Telephone Encounter (Signed)
Patient has scheduled an appointment 

## 2015-05-02 ENCOUNTER — Ambulatory Visit: Payer: 59 | Admitting: Family Medicine

## 2015-05-04 ENCOUNTER — Encounter: Payer: Self-pay | Admitting: Family Medicine

## 2015-05-04 ENCOUNTER — Ambulatory Visit (INDEPENDENT_AMBULATORY_CARE_PROVIDER_SITE_OTHER): Payer: 59 | Admitting: Family Medicine

## 2015-05-04 ENCOUNTER — Ambulatory Visit: Payer: Self-pay | Admitting: Family Medicine

## 2015-05-04 VITALS — BP 135/87 | HR 93 | Temp 98.3°F | Resp 16 | Ht 64.5 in | Wt 147.0 lb

## 2015-05-04 DIAGNOSIS — R413 Other amnesia: Secondary | ICD-10-CM | POA: Insufficient documentation

## 2015-05-04 DIAGNOSIS — R4701 Aphasia: Secondary | ICD-10-CM

## 2015-05-04 DIAGNOSIS — R269 Unspecified abnormalities of gait and mobility: Secondary | ICD-10-CM | POA: Insufficient documentation

## 2015-05-04 DIAGNOSIS — R410 Disorientation, unspecified: Secondary | ICD-10-CM | POA: Insufficient documentation

## 2015-05-04 DIAGNOSIS — R4702 Dysphasia: Secondary | ICD-10-CM | POA: Insufficient documentation

## 2015-05-04 DIAGNOSIS — R29898 Other symptoms and signs involving the musculoskeletal system: Secondary | ICD-10-CM | POA: Insufficient documentation

## 2015-05-04 DIAGNOSIS — R2990 Unspecified symptoms and signs involving the nervous system: Secondary | ICD-10-CM

## 2015-05-04 LAB — COMPREHENSIVE METABOLIC PANEL
ALT: 47 U/L — ABNORMAL HIGH (ref 6–29)
AST: 28 U/L (ref 10–30)
Albumin: 4.6 g/dL (ref 3.6–5.1)
Alkaline Phosphatase: 101 U/L (ref 33–115)
BUN: 10 mg/dL (ref 7–25)
CO2: 23 mmol/L (ref 20–31)
Calcium: 10.1 mg/dL (ref 8.6–10.2)
Chloride: 102 mmol/L (ref 98–110)
Creat: 0.69 mg/dL (ref 0.50–1.10)
Glucose, Bld: 111 mg/dL — ABNORMAL HIGH (ref 65–99)
Potassium: 4.2 mmol/L (ref 3.5–5.3)
Sodium: 139 mmol/L (ref 135–146)
Total Bilirubin: 0.5 mg/dL (ref 0.2–1.2)
Total Protein: 7 g/dL (ref 6.1–8.1)

## 2015-05-04 LAB — C-REACTIVE PROTEIN: CRP: 1 mg/dL — ABNORMAL HIGH (ref <0.60)

## 2015-05-04 LAB — VITAMIN B12: Vitamin B-12: >2000 pg/mL — ABNORMAL HIGH (ref 211–911)

## 2015-05-04 LAB — TSH: TSH: 2.86 u[IU]/mL (ref 0.350–4.500)

## 2015-05-04 LAB — T4, FREE: Free T4: 0.96 ng/dL (ref 0.80–1.80)

## 2015-05-04 NOTE — Patient Instructions (Signed)

## 2015-05-04 NOTE — Progress Notes (Signed)
Pre visit review using our clinic review tool, if applicable. No additional management support is needed unless otherwise documented below in the visit note. 

## 2015-05-04 NOTE — Progress Notes (Signed)
Subjective:    Patient ID: Selena Holt, female    DOB: 29-Feb-1971, 44 y.o.   MRN: [id number]  HPI Patient presents for office visit today with her daughter with concerns of memory loss and confusion. History was obtained from both patient and her daughter.  Confusion/memory loss: Patient states she has noticed a change in her mental status approximately 4 months ago. She states that the first thing she noticed was surrounding her work environment. She reports things that she knew how to do, and was able to perform on daily basis, she now "blanks out". She states she is forgetting how to do things that she know she knew how to do before. She states the people at work are starting to get irritated with her because she has to ask how to do things over and over. She is another example of when she needs to stop at the gas station to get on the way home from work. She states she went into the gas station and picked up some snacks, and then went home and relies she never did get the gas for her car. She reports there was a time when her daughter went to [employer], and she went off on her own to shop, when she looked down her buggy she realized she did not recognize anything in the cart. She had grabbed somebody else's cart and had no recollection of doing so, or when. She endorses increased confusion, not able to focus or concentrate. She states that her daughter reports talking to her, and she cannot feel like the words made since, she states she could not put together with her daughter with saying. Her daughter states that she sometimes is talking to her mother, and her mother has a blank stare, and does not respond to questions. Her mother states she feels like she does respond to questions, but her daughter says that she had never answered her. Patient's daughter states that she noticed approximately one month ago that her mother seems to be "confused all the time ".  Patient states she's also noticing  that her balance is off. She states that sometimes she notices she "scuffs "her feet when she walks. She states that this makes her look like she is tripping, or losing her balance. She reports that she's noticed it more in the right foot than the left foot. Has noticed in bilaterally. She feels this started approximately 4 weeks ago. She states her weakness occurs at any given time during the day. She endorses being cold sensitive, and feels that being cold causes her hands and legs to go numb. She states her hands hurt. Patient endorses increased fatigue, especially at the end of the day. She states that there is days she is just severely exhausted, for no reason. She reports her mornings are always good and she finds herself energetic. Patient states she does have visual changes. She reports she was at an eye doctor, and she told her she likely has an "autoimmune disease " by the look behind her eye. Patient had had neurological symptoms in her left arm in the summer, that she describes as tingling and numbness. She did her PCP at that time, which performed a x-ray of her neck, that showed mild degenerative changes and osteopenia. Patient states she feels like the left side of her face is more full all of a sudden compared to the right side of her face. She endorses visual changes that are intermittent blurred vision. She reports  difficulty swallowing on occasions and a cramp-like sensation in her throat. Her daughter states that her mom has been jumbling her words. She states she'll talk in sentences, with the words "mixed up". She states the words all make sense and are appropriate in the sentence, there just "out of order". Patient endorses bilateral lower extremity weakness.  Patient does have depression/anxiety/bipolar disorder. She has been on Abilify, Zoloft and Lamictal in the past. She had stopped all these medications after losing her insurance, and felt that she was on okay without them so she did  not seek treatment to have restarted after she started insurance. She now has a psychiatrist appointment on October 25 to discuss medications. She is hesitant to start some medications because she has lost a good deal of weight since stopping medications and her diabetes is better controlled. She fill this is because of weight gain she had on the medications. Patient endorses increased anxiety with panic attacks that started in June. She states she has no known trigger as to why they started in June. She feels extremely anxious, feels like she can't breathe or catch her breath.  Patient denies headache, fever, chills, syncope, unintended weight loss or seizure.  Patient has a family history of early Alzheimer's in her maternal grandmother, which she believes started when her grandmother was around 24. Femoral passed away at age 20. She does have a history of diabetes, with diabetic controlled on low-dose metformin.   Review of Systems  Constitutional: Positive for activity change and fatigue. Negative for fever, chills, diaphoresis, appetite change and unexpected weight change.  HENT: Positive for facial swelling and trouble swallowing. Negative for congestion, drooling, ear pain, hearing loss, mouth sores, nosebleeds, postnasal drip, rhinorrhea, sinus pressure, sneezing, sore throat, tinnitus and voice change.   Eyes: Positive for visual disturbance. Negative for photophobia and pain.  Respiratory: Negative for chest tightness and shortness of breath.   Cardiovascular: Negative for chest pain, palpitations and leg swelling.  Gastrointestinal: Negative for nausea, vomiting, abdominal pain, diarrhea and constipation.  Endocrine: Positive for cold intolerance and polyuria. Negative for heat intolerance, polydipsia and polyphagia.  Genitourinary: Negative for dysuria, urgency, hematuria, flank pain, decreased urine volume, vaginal bleeding, vaginal discharge, difficulty urinating, vaginal pain and pelvic  pain.  Musculoskeletal: Positive for myalgias, arthralgias and gait problem. Negative for back pain, joint swelling, neck pain and neck stiffness.  Skin: Negative for color change, pallor, rash and wound.  Allergic/Immunologic: Negative for immunocompromised state.  Neurological: Positive for dizziness, facial asymmetry, speech difficulty, weakness and numbness. Negative for tremors, seizures, syncope, light-headedness and headaches.  Hematological: Negative for adenopathy. Does not bruise/bleed easily.  Psychiatric/Behavioral: Positive for confusion, sleep disturbance and decreased concentration. Negative for suicidal ideas, hallucinations, behavioral problems and agitation. The patient is nervous/anxious. The patient is not hyperactive.        Objective:   Physical Exam BP 135/87 mmHg  Pulse 93  Temp(Src) 98.3 F (36.8 C) (Oral)  Resp 16  Ht 5' 4.5" (1.638 m)  Wt 147 lb (66.679 kg)  BMI 24.85 kg/m2  SpO2 100% Gen: Afebrile. No acute distress. Nontoxic in appearance, well-developed, well-nourished, Caucasian female. Pleasant. Makes good eye contact. HENT: AT. [place]. Bilateral TM visualized and normal in appearance. MMM. Bilateral nares without erythema or swelling. Throat without erythema or exudates.  Eyes:Pupils Equal Round Reactive to light, Extraocular movements intact,  Conjunctiva without redness, discharge or icterus. Neck/lymp/endocrine: Supple, no lymphadenopathy, no thyromegaly CV: RRR no murmur appreciated, no edema, +2/4 P posterior  tibialis pulses Chest: CTAB, no wheeze or crackles Abd: Soft. Flat. NTND. BS present. No Masses palpated.  Skin: No rashes, purpura or petechiae.  Neuro/MSK:  Normal gait. PERLA. EOMi. Alert. Oriented x3.  Cranial nerves II through XII intact. Uvula midline. Eyes symmetric, smile symmetric. Muscle strength 5/5 upper and lower extremity. DTRs equal bilaterally. Negative Romberg. Normal finger to nose test. Normal heel-to-shin test. Positive left  pronator drift. Psych: Normal affect, dress and demeanor. Normal speech. Normal thought content and judgment..       Assessment & Plan:  Confusion/memory loss/weakness:  Gait abnormality/Aphasia/Weakness of both lower limbs Neurological complaint - Uncertain etiology of confusion/memory loss. Seems to be new as of June. Family history of Alzheimer's at age 65. Patient also with neurological symptoms, that I'm uncertain if his connected to this diagnosis or a separate diagnosis with an itself. Discussed today the role of depression/anxiety/psychiatric illnesses that if uncontrolled can also play a role in the above conditions. - Patient encouraged to follow-up with her psychiatrist, she has an appointment for October 25. She has been on Abilify, Zoloft and Lamictal in the past and I suggested that she consider getting back on these medications. I offered to restart at least the Zoloft for her today to help her with her anxiety, and she declined. - Labs collected today for starting workup on autoimmune disorder, inflammatory disorder and dementia. - Discussed with patient today will likely need to obtain imaging of her head/brain will proceed with MRI today considering degree of neuro symptoms.  - DDX: MS, alzheimer's dementia, normal pressure hydrocephalus,  - TSH - T4, free - RPR - B12 - Vitamin D (25 hydroxy) - Antinuclear Antib (ANA) - PTH, Intact and Calcium - CBC w/Diff - Comprehensive metabolic panel - C-reactive protein - Sed Rate (ESR) - HIV antibody (with reflex) - Pt will need to follow up after MRI to discuss all results in length and perform full  Mental status exam.   > 25 minutes spent with patient, >50% of time spent face to face counseling patient and coordinating care.

## 2015-05-05 LAB — CBC WITH DIFFERENTIAL/PLATELET
Basophils Absolute: 0 10*3/uL (ref 0.0–0.1)
Basophils Relative: 0 % (ref 0–1)
Eosinophils Absolute: 0.1 10*3/uL (ref 0.0–0.7)
Eosinophils Relative: 1 % (ref 0–5)
HCT: 37 % (ref 36.0–46.0)
Hemoglobin: 12.5 g/dL (ref 12.0–15.0)
Lymphocytes Relative: 26 % (ref 12–46)
Lymphs Abs: 2.9 10*3/uL (ref 0.7–4.0)
MCH: 30.8 pg (ref 26.0–34.0)
MCHC: 33.8 g/dL (ref 30.0–36.0)
MCV: 91.1 fL (ref 78.0–100.0)
MPV: 11.2 fL (ref 8.6–12.4)
Monocytes Absolute: 0.7 10*3/uL (ref 0.1–1.0)
Monocytes Relative: 6 % (ref 3–12)
Neutro Abs: 7.4 10*3/uL (ref 1.7–7.7)
Neutrophils Relative %: 67 % (ref 43–77)
Platelets: 326 10*3/uL (ref 150–400)
RBC: 4.06 MIL/uL (ref 3.87–5.11)
RDW: 13.2 % (ref 11.5–15.5)
WBC: 11.1 10*3/uL — ABNORMAL HIGH (ref 4.0–10.5)

## 2015-05-05 LAB — SEDIMENTATION RATE: Sed Rate: 17 mm/hr (ref 0–20)

## 2015-05-05 LAB — VITAMIN D 25 HYDROXY (VIT D DEFICIENCY, FRACTURES): Vit D, 25-Hydroxy: 64 ng/mL (ref 30–100)

## 2015-05-05 LAB — ANA: Anti Nuclear Antibody(ANA): NEGATIVE

## 2015-05-05 LAB — RPR

## 2015-05-05 LAB — HIV ANTIBODY (ROUTINE TESTING W REFLEX): HIV 1&2 Ab, 4th Generation: NONREACTIVE

## 2015-05-07 ENCOUNTER — Ambulatory Visit: Payer: 59 | Admitting: Family Medicine

## 2015-05-07 ENCOUNTER — Telehealth: Payer: Self-pay | Admitting: Family Medicine

## 2015-05-07 LAB — PTH, INTACT AND CALCIUM
Calcium: 10.1 mg/dL (ref 8.4–10.5)
PTH: 22 pg/mL (ref 14–64)

## 2015-05-07 NOTE — Telephone Encounter (Signed)
Spoke with patient explained to her Dr Claiborne BillingsKuneff had not released labs to be reviewed ,we will call her when results released.

## 2015-05-07 NOTE — Telephone Encounter (Signed)
Pt called to see if her bloodwork had come back yet. She will call back at her 3:15 pm break to get the results.

## 2015-05-08 MED ORDER — LORAZEPAM 1 MG PO TABS: 1.0000 mg | ORAL_TABLET | Freq: Two times a day (BID) | ORAL | Status: DC | PRN

## 2015-05-08 NOTE — Telephone Encounter (Signed)
Called patient to discuss labs. Left message on her phone.  - Briefly informed her of elevated liver enzymes, elevated inflammatory marker, and elevated white count. We have scheduled an MRI for her this Saturday. I have called in a low dose Ativan to help her with her claustrophobia. Patient should be encouraged to have somebody drive her if she is taking Ativan. I will need to follow-up with her to go into detail about all of her labs, MRI results and make plan. This should occur no later than next week.

## 2015-05-08 NOTE — Telephone Encounter (Signed)
Patient needs medication for MRI for Saturday. She is claustrophobic. Please send to [facility] [address]

## 2015-05-08 NOTE — Telephone Encounter (Signed)
Rx faxed to pharmacy  

## 2015-05-08 NOTE — Telephone Encounter (Signed)
Please advise 

## 2015-05-08 NOTE — Telephone Encounter (Signed)
Pt called back requesting results of labs.

## 2015-05-09 NOTE — Telephone Encounter (Signed)
Pt never called back that I know of, however appt is scheduled to f/u 05/16/15.

## 2015-05-10 ENCOUNTER — Ambulatory Visit: Payer: 59 | Admitting: Psychology

## 2015-05-12 ENCOUNTER — Ambulatory Visit (HOSPITAL_BASED_OUTPATIENT_CLINIC_OR_DEPARTMENT_OTHER)
Admission: RE | Admit: 2015-05-12 | Discharge: 2015-05-12 | Disposition: A | Discharge status: Home / Self Care | Payer: 59 | Source: Ambulatory Visit | Attending: Family Medicine | Admitting: Family Medicine

## 2015-05-12 DIAGNOSIS — R29898 Other symptoms and signs involving the musculoskeletal system: Secondary | ICD-10-CM

## 2015-05-12 DIAGNOSIS — R200 Anesthesia of skin: Secondary | ICD-10-CM | POA: Diagnosis not present

## 2015-05-12 DIAGNOSIS — R2990 Unspecified symptoms and signs involving the nervous system: Secondary | ICD-10-CM

## 2015-05-12 DIAGNOSIS — R531 Weakness: Secondary | ICD-10-CM | POA: Insufficient documentation

## 2015-05-12 DIAGNOSIS — R410 Disorientation, unspecified: Secondary | ICD-10-CM

## 2015-05-12 DIAGNOSIS — R413 Other amnesia: Secondary | ICD-10-CM

## 2015-05-12 DIAGNOSIS — R269 Unspecified abnormalities of gait and mobility: Secondary | ICD-10-CM

## 2015-05-12 DIAGNOSIS — R4789 Other speech disturbances: Secondary | ICD-10-CM | POA: Diagnosis not present

## 2015-05-12 DIAGNOSIS — R4701 Aphasia: Secondary | ICD-10-CM

## 2015-05-14 ENCOUNTER — Telehealth: Payer: Self-pay | Admitting: Family Medicine

## 2015-05-14 NOTE — Telephone Encounter (Signed)
Reviewed MRI results with patient.

## 2015-05-14 NOTE — Telephone Encounter (Signed)
Please call pt:  - Her MRI appeared normal, we will go into more detail about her labs and MRI on her upcoming appt.

## 2015-05-16 ENCOUNTER — Telehealth: Payer: Self-pay | Admitting: Family Medicine

## 2015-05-16 ENCOUNTER — Encounter: Payer: Self-pay | Admitting: Family Medicine

## 2015-05-16 ENCOUNTER — Ambulatory Visit (INDEPENDENT_AMBULATORY_CARE_PROVIDER_SITE_OTHER): Payer: 59 | Admitting: Family Medicine

## 2015-05-16 ENCOUNTER — Ambulatory Visit
Admission: RE | Admit: 2015-05-16 | Discharge: 2015-05-16 | Disposition: A | Discharge status: Home / Self Care | Payer: 59 | Source: Ambulatory Visit | Attending: Family Medicine | Admitting: Family Medicine

## 2015-05-16 ENCOUNTER — Other Ambulatory Visit: Payer: Self-pay | Admitting: Family Medicine

## 2015-05-16 ENCOUNTER — Other Ambulatory Visit: Payer: Self-pay

## 2015-05-16 VITALS — BP 129/77 | HR 82 | Temp 98.5°F | Resp 20 | Ht 65.5 in | Wt 144.5 lb

## 2015-05-16 DIAGNOSIS — R413 Other amnesia: Secondary | ICD-10-CM

## 2015-05-16 DIAGNOSIS — R748 Abnormal levels of other serum enzymes: Secondary | ICD-10-CM

## 2015-05-16 DIAGNOSIS — R269 Unspecified abnormalities of gait and mobility: Secondary | ICD-10-CM

## 2015-05-16 DIAGNOSIS — R410 Disorientation, unspecified: Secondary | ICD-10-CM

## 2015-05-16 DIAGNOSIS — R2990 Unspecified symptoms and signs involving the nervous system: Secondary | ICD-10-CM

## 2015-05-16 DIAGNOSIS — R7982 Elevated C-reactive protein (CRP): Secondary | ICD-10-CM | POA: Insufficient documentation

## 2015-05-16 DIAGNOSIS — D72829 Elevated white blood cell count, unspecified: Secondary | ICD-10-CM | POA: Diagnosis not present

## 2015-05-16 DIAGNOSIS — R4702 Dysphasia: Secondary | ICD-10-CM

## 2015-05-16 NOTE — Telephone Encounter (Signed)
Reviewed xray and US results with patient. Let her know we would follow Neurology recommendations. Patient verbalized understanding.

## 2015-05-16 NOTE — Telephone Encounter (Signed)
Please call patient, her x-rays and ultrasound results have returned. - Ultrasound showed fatty liver disease, this is likely why she has consistent elevated liver enzymes. - X-rays showed: Known dextrocurvature (scoliosis) of the lumbar spine. There is no acute abnormality identified.  - I see she has neurology referral appointment scheduled for Monday 31st. I will follow along with their notes and recommendations.

## 2015-05-16 NOTE — Patient Instructions (Signed)
I will call you when results return,

## 2015-05-16 NOTE — Progress Notes (Signed)
Subjective:    Patient ID: Selena Holt, female    DOB: October 07, 1970, 44 y.o.   MRN: [id number]  HPI  She presents follow-up visit to discuss MRI results, laboratory results and discuss plan for prior visit/workup for confusion, dysphagia, memory loss, elevated CRP, chronic mild leukocytosis, chronic liver enzyme elevation and multiple neurological complaints.  Gait abnormality/Neurological complaints/confusion/dysphasia/memory loss: Reviewed all laboratory results and MRI studies with patient and daughter today. Patient was given a copy of her MRI results for her records. Patient endorses continued symptoms, no worsening and no improvement. She did admit to almost falling/tripping when she went to have her MRI over the weekend. She states she feels like she just cannot pick up her feet, and she frequently drags them, right greater than left. Patient states the sensation in her feet are normal, no numbness or tingling. She continues to jump or her words in a sentence, and has noticed she may be starting to mix up letters in her words when typing.  Discussed with patient she had a normal MRI, elevated CRP, consistently mild elevated white blood cell count, borderline elevated calcium, elevated liver function test. Her CMP and CBC was otherwise normal. Vitamin D 64, vitamin B 12 greater than 2000 (patient supplements), PTH normal, TSH 2.860, free T4 0.96. ANA negative, HIV negative, RPR negative. Discussed with patient the need for neurological referral.  Elevated liver enzymes/leukocytosis/elevated CRP: Patient has had consistently elevated liver enzymes and leukocytosis throughout the last 3 years. Leukocytosis without any signs of infection. Elevated CRP of 1.0, 05/04/2015. Discussed these results with patient today, and the results of elevated CRP being a generic inflammatory marker, that should being investigated further with her other neurological complaints. Patient has had a hepatitis B and  hepatitis C screening which were negative in the past. HIV negative. Abdominal ultrasound March 2015, with mildly increased echotexture of liver, which they felt "may "reflect fatty infiltration, there was also an incidental subcentimeter hyperechoic focus in the upper pole of the right kidney which "may "reflect an angiolipoma. Discussed with patient with  history of confusion, and prior abnormal abdominal ultrasound, will repeat abdominal ultrasound to check for stability.  Psych: Patient states that she has seen her psychiatrist, who wants to treat her as if she has "bipolar". Patient was asked to start  cariprazine Leafy Kindle), by Donell Sievert, PA-C, [facility] in [place]. Patient is to start this, but she states she is going to start this week.  Prior HPI 05/04/2015:  Confusion/memory loss: Patient states she has noticed a change in her mental status approximately 4 months ago. She states that the first thing she noticed was surrounding her work environment. She reports things that she knew how to do, and was able to perform on daily basis, she now "blanks out". She states she is forgetting how to do things that she know she knew how to do before. She states the people at work are starting to get irritated with her because she has to ask how to do things over and over. She is another example of when she needs to stop at the gas station to get on the way home from work. She states she went into the gas station and picked up some snacks, and then went home and relies she never did get the gas for her car. She reports there was a time when her daughter went to [employer], and she went off on her own to shop, when she looked down her buggy  she realized she did not recognize anything in the cart. She had grabbed somebody else's cart and had no recollection of doing so, or when. She endorses increased confusion, not able to focus or concentrate. She states that her daughter reports talking to her,  and she cannot feel like the words made since, she states she could not put together with her daughter with saying. Her daughter states that she sometimes is talking to her mother, and her mother has a blank stare, and does not respond to questions. Her mother states she feels like she does respond to questions, but her daughter says that she had never answered her. Patient's daughter states that she noticed approximately one month ago that her mother seems to be "confused all the time ".  Patient states she's also noticing that her balance is off. She states that sometimes she notices she "scuffs "her feet when she walks. She states that this makes her look like she is tripping, or losing her balance. She reports that she's noticed it more in the right foot than the left foot. Has noticed in bilaterally. She feels this started approximately 4 weeks ago. She states her weakness occurs at any given time during the day. She endorses being cold sensitive, and feels that being cold causes her hands and legs to go numb. She states her hands hurt. Patient endorses increased fatigue, especially at the end of the day. She states that there is days she is just severely exhausted, for no reason. She reports her mornings are always good and she finds herself energetic. Patient states she does have visual changes. She reports she was at an eye doctor, and she told her she likely has an "autoimmune disease " by the look behind her eye. Patient had had neurological symptoms in her left arm in the summer, that she describes as tingling and numbness. She did her PCP at that time, which performed a x-ray of her neck, that showed mild degenerative changes and osteopenia. Patient states she feels like the left side of her face is more full all of a sudden compared to the right side of her face. She endorses visual changes that are intermittent blurred vision. She reports difficulty swallowing on occasions and a cramp-like sensation  in her throat. Her daughter states that her mom has been jumbling her words. She states she'll talk in sentences, with the words "mixed up". She states the words all make sense and are appropriate in the sentence, there just "out of order". Patient endorses bilateral lower extremity weakness.  Patient does have depression/anxiety/bipolar disorder. She has been on Abilify, Zoloft and Lamictal in the past. She had stopped all these medications after losing her insurance, and felt that she was on okay without them so she did not seek treatment to have restarted after she started insurance. She now has a psychiatrist appointment on October 25 to discuss medications. She is hesitant to start some medications because she has lost a good deal of weight since stopping medications and her diabetes is better controlled. She fill this is because of weight gain she had on the medications. Patient endorses increased anxiety with panic attacks that started in June. She states she has no known trigger as to why they started in June. She feels extremely anxious, feels like she can't breathe or catch her breath.  Patient denies headache, fever, chills, syncope, unintended weight loss or seizure.  Patient has a family history of early Alzheimer's in her maternal grandmother, which  she believes started when her grandmother was around 37. Femoral passed away at age 20. She does have a history of diabetes, with diabetic controlled on low-dose metformin. Past Medical History  Diagnosis Date  . Hypertension   . Bipolar disorder (HCC)   . HSV-1 (herpes simplex virus 1) infection   . Diabetes mellitus without complication (HCC)   . Collapsed lung     "spontaneous" , teenager  . Scoliosis (and kyphoscoliosis), idiopathic   . Anemia    Allergies  Allergen Reactions  . Tetanus Toxoids Anaphylaxis    tetanus  . Ceclor [Cefaclor] Hives  . Penicillins Hives  . Sulfa Antibiotics Hives   Past Surgical History  Procedure  Laterality Date  . Chest tube insertion     Family History  Problem Relation Age of Onset  . Hyperlipidemia Mother   . Hypertension Mother   . Hyperlipidemia Father   . Hypertension Father   . CAD Father   . Heart disease Father   . Alzheimer's disease Maternal Grandmother 32   Social History   Social History  . Marital Status: Single    Spouse Name: N/A  . Number of Children: 2  . Years of Education: N/A   Occupational History  . clerical     [employer]   Social History Main Topics  . Smoking status: Never Smoker   . Smokeless tobacco: Not on file  . Alcohol Use: No     Comment: occ  . Drug Use: No  . Sexual Activity: No   Other Topics Concern  . Not on file   Social History Narrative   Ms. Bertram lives with her 2 teenage children in [place]. 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     Review of Systems  Constitutional: Positive for activity change and fatigue.  HENT: Positive for facial swelling and trouble swallowing. Negative for tinnitus and voice change.   Eyes: Positive for visual disturbance. Negative for photophobia, pain, discharge and redness.  Respiratory: Negative for chest tightness, shortness of breath and wheezing.   Cardiovascular: Negative for chest pain, palpitations and leg swelling.  Gastrointestinal: Negative for abdominal pain.  Endocrine: Positive for cold intolerance and polyuria. Negative for heat intolerance, polydipsia and polyphagia.  Genitourinary: Negative for difficulty urinating.  Musculoskeletal: Positive for myalgias, arthralgias and gait problem. Negative for back pain.  Skin: Negative.   Allergic/Immunologic: Negative for immunocompromised state.  Neurological: Positive for dizziness, facial asymmetry, speech difficulty, weakness and numbness. Negative for tremors, seizures, syncope, light-headedness and headaches.  Hematological: Negative for adenopathy.  Psychiatric/Behavioral: Positive for confusion, sleep  disturbance and decreased concentration. Negative for hallucinations, behavioral problems, dysphoric mood and agitation. The patient is nervous/anxious. The patient is not hyperactive.        Objective:   Physical Exam BP 129/77 mmHg  Pulse 82  Temp(Src) 98.5 F (36.9 C) (Oral)  Resp 20  Ht 5' 5.5" (1.664 m)  Wt 144 lb 8 oz (65.545 kg)  BMI 23.67 kg/m2  SpO2 97% Gen: Afebrile. No acute distress. Nontoxic in appearance, well-developed, well-nourished, Caucasian female, very pleasant. Makes good eye contact, cooperative with exam. HENT: AT. [place]. MMM.  Eyes:Pupils Equal Round Reactive to light, Extraocular movements intact,  Conjunctiva without redness, discharge or icterus. Neck/lymp/endocrine: Supple, no lymphadenopathy, no thyromegaly CV: RRR no murmur appreciated, no edema, +2/4 P posterior tibialis pulses Chest: CTAB, no wheeze or crackles MSK: 5/5 upper and lower extremity muscle strength. Neuro: Normal gait, normal coordination with heel and  toe walking. PERLA. EOMi. Alert. Oriented x3.  MMSE 30/30 (please see prior exam 05/04/15) Psych: Normal affect, dress and demeanor. Normal speech. Normal thought content and judgment..      Assessment & Plan:  1. Elevated liver enzymes/leukocytosis/elevated CRP - ? Fatty liver vs. ? Liver damage from exposure (? depakote).  - HIV negative, ANA negative, prior hep B/C heg - elevated CRP, consistent mildly elevated WBC, without infection signs. Borderline elevated calcium. Confusion, neurological signs. ? Infiltrative disease (CXR normal 11/2014). - US Abdomen Limited RUQ; Future - Hepatitis, Acute  2. Gait abnormality/Neurological complaints/confusion/dysphasia/memory loss - MMSE today 30/30, h/o of early dementia  - MRI normal. Unc ertain etiology, pt with complex picture. Ordered xray of thoracic and lumbar spine.  - elevated CRP, consistent mildly elevated WBC, without infection signs. Borderline elevated calcium. Confusion,  neurological signs. Elevated LFT. H/O of posterior eye changes (? Autoimmune) - DG Thoracic Spine 4V; Future - DG Lumbar Spine Complete; Future - Ambulatory referral to Neurology   Patient will be called with results once they become available, follow-up will depend upon neurological referral timing and results of ultrasound/imaging    > 25 minutes spent with patient, >50% of time spent face to face counseling patient and coordinating care.

## 2015-05-17 ENCOUNTER — Telehealth: Payer: Self-pay | Admitting: Family Medicine

## 2015-05-17 LAB — HEPATITIS PANEL, ACUTE
HCV Ab: NEGATIVE
Hep A IgM: NONREACTIVE
Hep B C IgM: NONREACTIVE
Hepatitis B Surface Ag: NEGATIVE

## 2015-05-17 NOTE — Telephone Encounter (Signed)
Please call pt her blood work is normal. Tested for hepatitis.

## 2015-05-18 ENCOUNTER — Encounter: Payer: Self-pay | Admitting: Family Medicine

## 2015-05-18 NOTE — Telephone Encounter (Signed)
Left detailed message on pt's cell.  Okay per DPR. 

## 2015-05-21 ENCOUNTER — Ambulatory Visit: Payer: Self-pay | Admitting: Neurology

## 2015-06-20 ENCOUNTER — Encounter: Payer: Self-pay | Admitting: Neurology

## 2015-06-20 ENCOUNTER — Ambulatory Visit (INDEPENDENT_AMBULATORY_CARE_PROVIDER_SITE_OTHER): Payer: 59 | Admitting: Neurology

## 2015-06-20 ENCOUNTER — Other Ambulatory Visit: Payer: 59

## 2015-06-20 VITALS — BP 110/70 | HR 91 | Ht 65.0 in | Wt 147.0 lb

## 2015-06-20 DIAGNOSIS — R4189 Other symptoms and signs involving cognitive functions and awareness: Secondary | ICD-10-CM | POA: Insufficient documentation

## 2015-06-20 NOTE — Patient Instructions (Signed)
1. Bloodwork for CRP, SS-A, SS-B, ACE 2. Schedule routine EEG 3. Schedule lumbar puncture under fluoroscopy 4. If tests are unrevealing, we will plan for Neurocognitive testing 5. Follow-up in 2 months

## 2015-06-20 NOTE — Progress Notes (Signed)
NEUROLOGY CONSULTATION NOTE  Selena Holt MRN: [id number] DOB: 1971/04/28  Referring provider: Dr. Felix Pacinienee Kuneff Primary care provider: Dr. Felix Pacinienee Kuneff  Reason for consult:  Memory loss, confusion, word-finding, tripping  Dear Dr Claiborne BillingsKuneff:  Thank you for your kind referral of Selena Holt for consultation of the above symptoms. Although her history is well known to you, please allow me to reiterate it for the purpose of our medical record. The patient was accompanied to the clinic by her daughter who also provides collateral information. Records and images were personally reviewed where available.  HISTORY OF PRESENT ILLNESS: This is a pleasant 44 year old right-handed woman with a history of diabetes and B12 deficiency, presenting for evaluation of cognitive changes that started 5 months ago. She initially noticed she was becoming more forgetful. At work, she could not recall how to do things that she had routinely been doing since she started in December 2015. She reported that people at work were getting irritated at her because she would ask how to do things repeatedly. She has found that when typing, she would spell a word incorrectly. She just mostly keeps quiet and states that she has not been written up or reported at work. She has a routine in the shower, but forgets if she already shampooed or not. One time she went to a gas station and picked up snacks, went home and realized she never got gas. Another time they were are [employer] and she looked down and saw she had taken someone else's shopping cart. She has difficulties focusing and concentrating, her daughter states she looks dazed, foggy and confused about everything going on around her. Sometimes it appears like she is needing to clear her throat, but her words would come out in the wrong order. She feels she hears and comprehends what people tell her, but would have no clue what they said a few minutes later. She would get lost  driving if she used a different route from her routine. She occasionally misses her medications. No missed bills, her daughter has been helping for the past 2 months. No staring/unresponsive episodes. She denies any olfactory/gustatory hallucinations, deja vu, rising epigastric sensation, myoclonic jerks. She has had constant numbness on the left arm and hand for the past month. She feels her balance is off, like she is scuffing either foot when walking. She feels the left side of her face is droopy. She has some blurred vision, more after she would eat, and had seen an eye doctor who asked her what autoimmune disease she had because "she had scarring in her eye from an autoimmune disease." She has infrequent headaches, no dizziness, dysarthria/dysphagia, neck/back pain, bowel/bladder dysfunction.   She reports mood is up and down. There is note of depression/anxiety/bipolar disorder, previously on Abilify, Zoloft, and Lamictal. She had stopped medications after losing her insurance and felt okay. She had endorsed increased anxiety with panic attacks since June. She reported feeling extremely anxious at times where she can't breathe or catch her breath. She has a history of bacterial meningitis in 2003. Her daughter has epilepsy, paternal grandfather had Alzheimer's disease. She denies any significant head injuries.   I personally reviewed MRI brain with and without contrast which did not show any acute changes. There were 3 or 4 punctate subcortical T2 hyperintensities without abnormal enhancement.  PAST MEDICAL HISTORY: Past Medical History  Diagnosis Date  . Hypertension   . Bipolar disorder (HCC)   . HSV-1 (herpes simplex virus  1) infection   . Diabetes mellitus without complication (HCC)   . Collapsed lung     "spontaneous" , teenager  . Scoliosis (and kyphoscoliosis), idiopathic   . Anemia     PAST SURGICAL HISTORY: Past Surgical History  Procedure Laterality Date  . Chest tube insertion       MEDICATIONS: Current Outpatient Prescriptions on File Prior to Visit  Medication Sig Dispense Refill  . Cyanocobalamin (VITAMIN B-12 PO) Take 1,200 mcg by mouth daily.    . ferrous gluconate (FERGON) 324 MG tablet Take 1 tablet (324 mg total) by mouth daily. 30 tablet 3  . levonorgestrel (MIRENA) 20 MCG/24HR IUD 1 each by Intrauterine route once.    . metFORMIN (GLUCOPHAGE) 500 MG tablet Take 1 tablet (500 mg total) by mouth daily with breakfast. 30 tablet 5  . Cholecalciferol (D3 MAXIMUM STRENGTH) 5000 UNIT/ML LIQD Take 1 mL (5,000 Units total) by mouth daily with supper. (Patient not taking: Reported on 06/20/2015) 30 mL 2   No current facility-administered medications on file prior to visit.    ALLERGIES: Allergies  Allergen Reactions  . Tetanus Toxoids Anaphylaxis    tetanus  . Ceclor [Cefaclor] Hives  . Penicillins Hives  . Sulfa Antibiotics Hives    FAMILY HISTORY: Family History  Problem Relation Age of Onset  . Hyperlipidemia Mother   . Hypertension Mother   . Hyperlipidemia Father   . Hypertension Father   . CAD Father   . Heart disease Father   . Alzheimer's disease Maternal Grandmother 53    SOCIAL HISTORY: Social History   Social History  . Marital Status: Single    Spouse Name: N/A  . Number of Children: 2  . Years of Education: N/A   Occupational History  . clerical     [employer]   Social History Main Topics  . Smoking status: Never Smoker   . Smokeless tobacco: Never Used  . Alcohol Use: No     Comment: occ  . Drug Use: No  . Sexual Activity: No   Other Topics Concern  . Not on file   Social History Narrative   Ms. Corne lives with her 2 teenage children in [place]. 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     REVIEW OF SYSTEMS: Constitutional: No fevers, chills, or sweats, no generalized fatigue, change in appetite Eyes: as above Ear, nose and throat: No hearing loss, ear pain, nasal congestion, sore  throat Cardiovascular: No chest pain, palpitations Respiratory:  No shortness of breath at rest or with exertion, wheezes GastrointestinaI: No nausea, vomiting, diarrhea, abdominal pain, fecal incontinence Genitourinary:  No dysuria, urinary retention or frequency Musculoskeletal:  No neck pain, back pain Integumentary: No rash, pruritus, skin lesions Neurological: as above Psychiatric: + depression, insomnia, anxiety Endocrine: No palpitations, fatigue, diaphoresis, mood swings, change in appetite, change in weight, increased thirst Hematologic/Lymphatic:  No anemia, purpura, petechiae. Allergic/Immunologic: no itchy/runny eyes, nasal congestion, recent allergic reactions, rashes  PHYSICAL EXAM: Filed Vitals:   06/20/15 1246  BP: 110/70  Pulse: 91   General: No acute distress Head:  Normocephalic/atraumatic Eyes: Fundoscopic exam shows bilateral sharp discs, no vessel changes, exudates, or hemorrhages Neck: supple, no paraspinal tenderness, full range of motion Back: No paraspinal tenderness Heart: regular rate and rhythm Lungs: Clear to auscultation bilaterally. Vascular: No carotid bruits. Skin/Extremities: No rash, no edema Neurological Exam: Mental status: alert and oriented to person, place, and time, no dysarthria or aphasia, Fund of knowledge is appropriate.  Recent and remote memory are intact.  Attention and concentration are normal.    Able to name objects and repeat phrases.  Montreal Cognitive Assessment  06/20/2015  Visuospatial/ Executive (0/5) 5  Naming (0/3) 3  Attention: Read list of digits (0/2) 2  Attention: Read list of letters (0/1) 1  Attention: Serial 7 subtraction starting at 100 (0/3) 3  Language: Repeat phrase (0/2) 2  Language : Fluency (0/1) 1  Abstraction (0/2) 2  Delayed Recall (0/5) 5  Orientation (0/6) 6  Total 30  Adjusted Score (based on education) 30   Cranial nerves: CN I: not tested CN II: pupils equal, round and reactive to light,  visual fields intact, fundi unremarkable. CN III, IV, VI:  full range of motion, no nystagmus, no ptosis CN V: facial sensation intact CN VII: upper and lower face symmetric CN VIII: hearing intact to finger rub CN IX, X: gag intact, uvula midline CN XI: sternocleidomastoid and trapezius muscles intact CN XII: tongue midline Bulk & Tone: normal, no fasciculations. Motor: 5/5 throughout with no pronator drift. Sensation: intact to light touch, cold, pin, vibration and joint position sense.  No extinction to double simultaneous stimulation.  Romberg test negative Deep Tendon Reflexes: +1 both UE, +2 bilateral patella, +1 bilateral ankle jerks, no ankle clonus Plantar responses: upgoing bilaterally Cerebellar: no incoordination on finger to nose, heel to shin. No dysdiadochokinesia Gait: narrow-based and steady, able to tandem walk adequately. Tremor: none  IMPRESSION: This is a pleasant 44 year old right-handed woman with a history of diabetes, B12 deficiency, anxiety, presenting for cognitive changes over the past 5 months that family and co-workers have noticed as well. Her MOCA score today is normal 30/30, however she was noted to have an elevated CRP and there is question of autoimmune disorder, which could also potentially cause cognitive changes. MRI brain unremarkable. We discussed further workup for encephalopathy, including a lumbar puncture to assess for elevated protein, as well as EEG. Repeat CRP, as well as SS-A, SS-B, and ACE level will be ordered. We discussed the possibility of pseudodementia and effects of mood on memory, if tests are unrevealing, she will be referred for Neurocognitive testing. She will follow-up in 2 months.   Thank you for allowing me to participate in the care of this patient. Please do not hesitate to call for any questions or concerns.   Patrcia Dolly, M.D.  CC: Dr. Claiborne Billings

## 2015-06-25 ENCOUNTER — Ambulatory Visit: Payer: Self-pay | Admitting: Neurology

## 2015-07-11 ENCOUNTER — Ambulatory Visit (INDEPENDENT_AMBULATORY_CARE_PROVIDER_SITE_OTHER): Payer: 59 | Admitting: Neurology

## 2015-07-11 ENCOUNTER — Other Ambulatory Visit: Payer: Self-pay

## 2015-07-11 ENCOUNTER — Other Ambulatory Visit: Payer: Self-pay | Admitting: *Deleted

## 2015-07-11 ENCOUNTER — Other Ambulatory Visit (INDEPENDENT_AMBULATORY_CARE_PROVIDER_SITE_OTHER): Payer: 59

## 2015-07-11 DIAGNOSIS — R4189 Other symptoms and signs involving cognitive functions and awareness: Secondary | ICD-10-CM

## 2015-07-11 LAB — C-REACTIVE PROTEIN: CRP: 1.3 mg/dL (ref 0.5–20.0)

## 2015-07-12 LAB — SJOGREN'S SYNDROME ANTIBODS(SSA + SSB)
SSA (Ro) (ENA) Antibody, IgG: <1
SSB (La) (ENA) Antibody, IgG: <1

## 2015-07-12 LAB — ANGIOTENSIN CONVERTING ENZYME: Angiotensin-Converting Enzyme: 18 U/L (ref 8–52)

## 2015-07-13 NOTE — Procedures (Signed)
ELECTROENCEPHALOGRAM REPORT  Date of Study: 07/11/2015  Patient's Name: Meda CoffeeZondra K Mccubbin MRN: [id number] Date of Birth: [birth year]  Referring Provider: Dr. Patrcia DollyKaren Almira Phetteplace   Clinical History: This is a 44 year old woman with cognitive changes.  Medications: Metformin, vitamin B12  Technical Summary: A multichannel digital EEG recording measured by the international 10-20 system with electrodes applied with paste and impedances below 5000 ohms performed in our laboratory with EKG monitoring in an awake and asleep patient.  Hyperventilation and photic stimulation were performed.  The digital EEG was referentially recorded, reformatted, and digitally filtered in a variety of bipolar and referential montages for optimal display.    Description: The patient is awake and asleep during the recording.  During maximal wakefulness, there is a symmetric, medium voltage 9-10 Hz posterior dominant rhythm that attenuates with eye opening.  The record is symmetric.  During drowsiness and sleep, there is an increase in theta slowing of the background.  Vertex waves and symmetric sleep spindles were seen.  Hyperventilation and photic stimulation did not elicit any abnormalities.  There were no epileptiform discharges or electrographic seizures seen.    EKG lead was unremarkable.  Impression: This awake and asleep EEG is normal.    Clinical Correlation: A normal EEG does not exclude a clinical diagnosis of epilepsy.  If further clinical questions remain, prolonged EEG may be helpful.  Clinical correlation is advised.   Patrcia DollyKaren Landree Fernholz, M.D.

## 2015-07-17 ENCOUNTER — Telehealth: Payer: Self-pay | Admitting: Family Medicine

## 2015-07-17 NOTE — Telephone Encounter (Signed)
Lmovm to rtn my call. 

## 2015-07-17 NOTE — Telephone Encounter (Signed)
Patient returned my call. Notified her of result. 

## 2015-07-17 NOTE — Telephone Encounter (Signed)
-----   Message from Van ClinesKaren M Aquino, MD sent at 07/17/2015 12:58 PM EST ----- Pls let her know EEG showed normal brain waves. Thanks

## 2015-07-19 ENCOUNTER — Telehealth: Payer: Self-pay | Admitting: Family Medicine

## 2015-07-19 ENCOUNTER — Ambulatory Visit (HOSPITAL_BASED_OUTPATIENT_CLINIC_OR_DEPARTMENT_OTHER)
Admission: RE | Admit: 2015-07-19 | Discharge: 2015-07-19 | Disposition: A | Discharge status: Home / Self Care | Payer: 59 | Source: Ambulatory Visit | Attending: Family Medicine | Admitting: Family Medicine

## 2015-07-19 ENCOUNTER — Ambulatory Visit (INDEPENDENT_AMBULATORY_CARE_PROVIDER_SITE_OTHER): Payer: 59 | Admitting: Family Medicine

## 2015-07-19 ENCOUNTER — Encounter: Payer: Self-pay | Admitting: Family Medicine

## 2015-07-19 VITALS — BP 118/82 | HR 57 | Temp 98.0°F | Resp 20 | Wt 147.0 lb

## 2015-07-19 DIAGNOSIS — B353 Tinea pedis: Secondary | ICD-10-CM

## 2015-07-19 DIAGNOSIS — R1032 Left lower quadrant pain: Secondary | ICD-10-CM | POA: Diagnosis not present

## 2015-07-19 DIAGNOSIS — R102 Pelvic and perineal pain: Secondary | ICD-10-CM | POA: Diagnosis not present

## 2015-07-19 DIAGNOSIS — R112 Nausea with vomiting, unspecified: Secondary | ICD-10-CM | POA: Diagnosis not present

## 2015-07-19 LAB — CBC WITH DIFFERENTIAL/PLATELET
Basophils Absolute: 0 10*3/uL (ref 0.0–0.1)
Basophils Relative: 0.3 % (ref 0.0–3.0)
Eosinophils Absolute: 0.1 10*3/uL (ref 0.0–0.7)
Eosinophils Relative: 0.5 % (ref 0.0–5.0)
HCT: 39.9 % (ref 36.0–46.0)
Hemoglobin: 13.3 g/dL (ref 12.0–15.0)
Lymphocytes Relative: 12.3 % (ref 12.0–46.0)
Lymphs Abs: 1.6 10*3/uL (ref 0.7–4.0)
MCHC: 33.4 g/dL (ref 30.0–36.0)
MCV: 92.9 fl (ref 78.0–100.0)
Monocytes Absolute: 0.6 10*3/uL (ref 0.1–1.0)
Monocytes Relative: 4.3 % (ref 3.0–12.0)
Neutro Abs: 10.7 10*3/uL — ABNORMAL HIGH (ref 1.4–7.7)
Neutrophils Relative %: 82.6 % — ABNORMAL HIGH (ref 43.0–77.0)
Platelets: 332 10*3/uL (ref 150.0–400.0)
RBC: 4.3 Mil/uL (ref 3.87–5.11)
RDW: 13.9 % (ref 11.5–15.5)
WBC: 13 10*3/uL — ABNORMAL HIGH (ref 4.0–10.5)

## 2015-07-19 LAB — BASIC METABOLIC PANEL
BUN: 8 mg/dL (ref 6–23)
CO2: 24 mEq/L (ref 19–32)
Calcium: 9.9 mg/dL (ref 8.4–10.5)
Chloride: 102 mEq/L (ref 96–112)
Creatinine, Ser: 0.66 mg/dL (ref 0.40–1.20)
GFR: 103.88 mL/min (ref >60.00)
Glucose, Bld: 125 mg/dL — ABNORMAL HIGH (ref 70–99)
Potassium: 4.3 mEq/L (ref 3.5–5.1)
Sodium: 138 mEq/L (ref 135–145)

## 2015-07-19 LAB — POC URINALSYSI DIPSTICK (AUTOMATED)
Bilirubin, UA: NEGATIVE
Blood, UA: NEGATIVE
Glucose, UA: NEGATIVE
Ketones, UA: NEGATIVE
Leukocytes, UA: NEGATIVE
Nitrite, UA: NEGATIVE
Spec Grav, UA: 1.025
Urobilinogen, UA: 1
pH, UA: 6

## 2015-07-19 LAB — POCT URINE PREGNANCY: Preg Test, Ur: NEGATIVE

## 2015-07-19 MED ORDER — CIPROFLOXACIN HCL 500 MG PO TABS
500.0000 mg | ORAL_TABLET | Freq: Two times a day (BID) | ORAL | Status: DC
Start: 2015-07-19 — End: 2016-07-14

## 2015-07-19 MED ORDER — CLOTRIMAZOLE 1 % EX CREA: 1.0000 "application " | TOPICAL_CREAM | Freq: Two times a day (BID) | CUTANEOUS | Status: DC

## 2015-07-19 MED ORDER — ONDANSETRON HCL 4 MG PO TABS: 4.0000 mg | ORAL_TABLET | Freq: Three times a day (TID) | ORAL | Status: DC | PRN

## 2015-07-19 MED ORDER — KETOROLAC TROMETHAMINE 60 MG/2ML IM SOLN
60.0000 mg | Freq: Once | INTRAMUSCULAR | Status: AC
  Administered 2015-07-19: 60 mg via INTRAMUSCULAR

## 2015-07-19 MED ORDER — NAPROXEN 500 MG PO TABS: 500.0000 mg | ORAL_TABLET | Freq: Two times a day (BID) | ORAL | Status: DC

## 2015-07-19 NOTE — Progress Notes (Signed)
Subjective:    Patient ID: Selena Holt, female    DOB: May 07, 1971, 44 y.o.   MRN: [id number]  HPI  Abdominal pain: Patient presents for an acute office visit with a 2 weeks history of intermittent suprapubic pain. She describes this pain as severe enough that it feels that cutting through to her back. Patient denies any fever, chills, vaginal itching, vaginal irritation, low back pain or changes in sexual partner. Patient endorses history of kidney stones many years ago, change in urination, dysuria, new onset vomiting this morning. Patient is eating and drinking okay but feeling nauseous. She states with urination she is having left suprapubic discomfort. Patient states once a few days ago she noticed a substance that look like Holt grinds in her urine. She has a Mirena has been in place since August 2016. She states she has a constant yellowish discharge, that has been normal per her gynecologist and is secondary to her IUD.   Never smoker  Past Medical History  Diagnosis Date  . Hypertension   . Bipolar disorder (HCC)   . HSV-1 (herpes simplex virus 1) infection   . Diabetes mellitus without complication (HCC)   . Collapsed lung     "spontaneous" , teenager  . Scoliosis (and kyphoscoliosis), idiopathic   . Anemia    Allergies  Allergen Reactions  . Tetanus Toxoids Anaphylaxis    tetanus  . Ceclor [Cefaclor] Hives  . Penicillins Hives  . Sulfa Antibiotics Hives    Review of Systems Negative, with the exception of above mentioned in HPI     Objective:   Physical Exam BP 118/82 mmHg  Pulse 57  Temp(Src) 98 F (36.7 C)  Resp 20  Wt 147 lb (66.679 kg)  SpO2 97% Gen: Afebrile. No acute distress. Nontoxic in appearance, well-developed, well-nourished, pleasant Caucasian female. HENT: AT. [place].  MMM.  Eyes:Pupils Equal Round Reactive to light, Extraocular movements intact,  Conjunctiva without redness, discharge or icterus. CV: RRR  Chest: CTAB, no wheeze or  crackles Abd: Soft. Mildly distended, suprapubic tenderness present, increased stool burden present, no rebound, no guarding. No hepatosplenomegaly. MSK: No CVA tenderness bilaterally. Skin: No purpura or petechiae. Right forefoot with mild scaly rash, right large toe nail thickening. Neuro: Normal gait. PERLA. EOMi. Alert. Oriented x3  GYN:  External genitalia within normal limits.  Vaginal mucosa pink, moist, normal rugae.  Nonfriable cervix without lesions, IUD strings noted at cervical os, no bleeding noted on speculum exam, increased white thick vaginal discharge.  Bimanual exam revealed normal, nongravid uterus.  No cervical motion tenderness. No adnexal masses bilaterally.       Assessment & Plan:  1. Pelvic pain in female - IUD strings identified, ruling out IUD migration. Urine today did not show any signs of infection or blood. However patient's story of "Holt-ground "substance in her urine would be consistent with possible kidney stone. Concern for continued abdominal pain. Will obtain KUB today, an order CT without contrast to evaluate kidneys, ureters, abdomen and pelvic structures. Patient does have new onset nausea starting today, makes this concerning for possible hydronephrosis/kidney stone obstruction. Urine will be sent for culture despite normal reading. Patient was placed on prophylactic Cipro. CBC and BMP collected today. Urine strainer provided to patient today, she is encouraged to strain all urine and place any stones collected in a urine specimen which was also provided today. Toradol injection was provided to patient office today, and then she was encouraged to start naproxen 500 mg twice a  day starting tomorrow. - Patient was strongly encouraged if her symptoms increase, she is unable to tolerate pain medications and or antibiotics, she has decreasing urine and she needs to be seen immediately in the emergency room. - POCT Urinalysis Dipstick (Automated)--> negative bili,  negative ketones, negative leukocytes, negative nitrates, negative blood. - POCT urine pregnancy--> negative - Urine Culture - KOH - CBC w/Diff - Basic Metabolic Panel (BMET) - DG Abd 1 View; Future - ondansetron (ZOFRAN) 4 MG tablet; Take 1 tablet (4 mg total) by mouth every 8 (eight) hours as needed for nausea or vomiting.  Dispense: 20 tablet; Refill: 0 - naproxen (NAPROSYN) 500 MG tablet; Take 1 tablet (500 mg total) by mouth 2 (two) times daily with a meal.  Dispense: 30 tablet; Refill: 0 - ketorolac (TORADOL) injection 60 mg; Inject 2 mLs (60 mg total) into the muscle once. - CT Abdomen Pelvis Wo Contrast; Future - Ciprofloxacin 500 mg twice a day 10 days.  2. Nausea and vomiting, intractability of vomiting not specified, unspecified vomiting type - Zofran provider for nausea, Cipro for prophylactic antibiotic.  3. Tinea pedis of right foot - Patient added on that she had a rash on her right foot appeared to be mild fungal infection. - clotrimazole (LOTRIMIN) 1 % cream; Apply 1 application topically 2 (two) times daily.  Dispense: 30 g; Refill: 0  Follow-up one week

## 2015-07-19 NOTE — Telephone Encounter (Signed)
Please call patient: Her abdominal x-ray did not show any acute process. It did show that she is moderately constipated. Patient should continue the treatment with the antibiotics, Zofran, naproxen.  If she is having increased pain, decreasing urination, unable to tolerate medications and she is to be seen immediately in the emergency room. There has been a CT ordered for her , that we will attempt to set up for tomorrow. -  She could also add a stool softener/Miralax  to her regimen to help with her constipation.

## 2015-07-19 NOTE — Telephone Encounter (Signed)
Spoke with patient reviewed xray results and instructions with patient. Patient verbalized understanding. 

## 2015-07-19 NOTE — Patient Instructions (Signed)
I have called in a nausea medicine, an antibiotic and naproxen (anti-inflammatory). Start naproxen tomorrow. The shot today will help with discomfort and allowing the stone to pass if that is what is wrong.  Go to [facility] and get an xray today and we will likely be getting a CT as well.  Strain all urine .   If you have worsening pain, unable tolerate medicine or vomiting increases be seen immediately in ED.  I will want to followup next week with you regardless.   Kidney Stones Kidney stones (urolithiasis) are deposits that form inside your kidneys. The intense pain is caused by the stone moving through the urinary tract. When the stone moves, the ureter goes into spasm around the stone. The stone is usually passed in the urine.  CAUSES   A disorder that makes certain neck glands produce too much parathyroid hormone (primary hyperparathyroidism).  A buildup of uric acid crystals, similar to gout in your joints.  Narrowing (stricture) of the ureter.  A kidney obstruction present at birth (congenital obstruction).  Previous surgery on the kidney or ureters.  Numerous kidney infections. SYMPTOMS   Feeling sick to your stomach (nauseous).  Throwing up (vomiting).  Blood in the urine (hematuria).  Pain that usually spreads (radiates) to the groin.  Frequency or urgency of urination. DIAGNOSIS   Taking a history and physical exam.  Blood or urine tests.  CT scan.  Occasionally, an examination of the inside of the urinary bladder (cystoscopy) is performed. TREATMENT   Observation.  Increasing your fluid intake.  Extracorporeal shock wave lithotripsy--This is a noninvasive procedure that uses shock waves to break up kidney stones.  Surgery may be needed if you have severe pain or persistent obstruction. There are various surgical procedures. Most of the procedures are performed with the use of small instruments. Only small incisions are needed to accommodate  these instruments, so recovery time is minimized. The size, location, and chemical composition are all important variables that will determine the proper choice of action for you. Talk to your health care provider to better understand your situation so that you will minimize the risk of injury to yourself and your kidney.  HOME CARE INSTRUCTIONS   Drink enough water and fluids to keep your urine clear or pale yellow. This will help you to pass the stone or stone fragments.  Strain all urine through the provided strainer. Keep all particulate matter and stones for your health care provider to see. The stone causing the pain may be as small as a grain of salt. It is very important to use the strainer each and every time you pass your urine. The collection of your stone will allow your health care provider to analyze it and verify that a stone has actually passed. The stone analysis will often identify what you can do to reduce the incidence of recurrences.  Only take over-the-counter or prescription medicines for pain, discomfort, or fever as directed by your health care provider.  Keep all follow-up visits as told by your health care provider. This is important.  Get follow-up X-rays if required. The absence of pain does not always mean that the stone has passed. It may have only stopped moving. If the urine remains completely obstructed, it can cause loss of kidney function or even complete destruction of the kidney. It is your responsibility to make sure X-rays and follow-ups are completed. Ultrasounds of the kidney can show blockages and the status of the kidney. Ultrasounds  are not associated with any radiation and can be performed easily in a matter of minutes.  Make changes to your daily diet as told by your health care provider. You may be told to:  Limit the amount of salt that you eat.  Eat 5 or more servings of fruits and vegetables each day.  Limit the amount of meat, poultry, fish, and  eggs that you eat.  Collect a 24-hour urine sample as told by your health care provider.You may need to collect another urine sample every 6-12 months. SEEK MEDICAL CARE IF:  You experience pain that is progressive and unresponsive to any pain medicine you have been prescribed. SEEK IMMEDIATE MEDICAL CARE IF:   Pain cannot be controlled with the prescribed medicine.  You have a fever or shaking chills.  The severity or intensity of pain increases over 18 hours and is not relieved by pain medicine.  You develop a new onset of abdominal pain.  You feel faint or pass out.  You are unable to urinate.   This information is not intended to replace advice given to you by your health care provider. Make sure you discuss any questions you have with your health care provider.   Document Released: 07/07/2005 Document Revised: 03/28/2015 Document Reviewed: 12/08/2012 Elsevier Interactive Patient Education [employer]2016 [employer] Inc.

## 2015-07-20 ENCOUNTER — Encounter: Payer: Self-pay | Admitting: Family Medicine

## 2015-07-20 ENCOUNTER — Telehealth: Payer: Self-pay | Admitting: Family Medicine

## 2015-07-20 DIAGNOSIS — N200 Calculus of kidney: Secondary | ICD-10-CM

## 2015-07-20 NOTE — Telephone Encounter (Signed)
Reviewed results and instructions with patient. Patient verbalized understanding.

## 2015-07-20 NOTE — Telephone Encounter (Signed)
Please call pt: - her CT did show small  kidney stones on both kidneys. Neither are obstructing her urine flow, and her kidneys appeared normal. - These particular stones are likely not what is causing her pain. She likely has passed the stone that was causing her discomfort. - She should continue the antibiotics and naproxen.  - if pain worsens she will need to be seen in the ED.  - Some discomfort could be coming from her constipation as well. Discussed miralax use daily.  - her other organs/abd/pelvis is normal and no signs of cause of discomfort. - we do not have her urine results yet and those will not be back until next week.  - she should follow with urology on her kidney stones, they usually follow yearly if they are nonobstructive. I have placed this referral for her.  Results:  - The right kidney demonstrates a single nonobstructing stone measuring 1-2 mm. The collecting system and right ureter are within normal limits. -The left kidney also demonstrates nonobstructing stone measure 2-3 mm. The collecting system and ureter are otherwise within normal limits. No obstructive changes are seen.

## 2015-07-21 LAB — URINE CULTURE
Colony Count: NO GROWTH
Organism ID, Bacteria: NO GROWTH

## 2015-07-24 ENCOUNTER — Telehealth: Payer: Self-pay | Admitting: Family Medicine

## 2015-07-24 LAB — WET PREP BY MOLECULAR PROBE
Candida species: NEGATIVE
Gardnerella vaginalis: NEGATIVE
Trichomonas vaginosis: NEGATIVE

## 2015-07-24 NOTE — Telephone Encounter (Signed)
Please call pt: her urine was negative (no growth of bacteria). Her discomfort was not of an infectious source. She likely passed a kidney stone causing her discomort.

## 2015-07-30 ENCOUNTER — Encounter: Payer: Self-pay | Admitting: Family Medicine

## 2015-08-09 ENCOUNTER — Ambulatory Visit: Payer: Self-pay | Admitting: Neurology

## 2015-08-13 ENCOUNTER — Encounter (HOSPITAL_COMMUNITY): Payer: Self-pay

## 2015-08-13 ENCOUNTER — Emergency Department (HOSPITAL_COMMUNITY)
Admission: EM | Admit: 2015-08-13 | Discharge: 2015-08-13 | Discharge status: Against Medical Advice | Payer: 59 | Attending: Emergency Medicine | Admitting: Emergency Medicine

## 2015-08-13 DIAGNOSIS — M545 Low back pain: Secondary | ICD-10-CM | POA: Diagnosis not present

## 2015-08-13 DIAGNOSIS — R1032 Left lower quadrant pain: Secondary | ICD-10-CM | POA: Diagnosis present

## 2015-08-13 DIAGNOSIS — I10 Essential (primary) hypertension: Secondary | ICD-10-CM | POA: Diagnosis not present

## 2015-08-13 DIAGNOSIS — R140 Abdominal distension (gaseous): Secondary | ICD-10-CM | POA: Insufficient documentation

## 2015-08-13 DIAGNOSIS — E119 Type 2 diabetes mellitus without complications: Secondary | ICD-10-CM | POA: Diagnosis not present

## 2015-08-13 DIAGNOSIS — R3911 Hesitancy of micturition: Secondary | ICD-10-CM | POA: Insufficient documentation

## 2015-08-13 LAB — COMPREHENSIVE METABOLIC PANEL
ALT: 44 U/L (ref 14–54)
AST: 23 U/L (ref 15–41)
Albumin: 4.4 g/dL (ref 3.5–5.0)
Alkaline Phosphatase: 77 U/L (ref 38–126)
Anion gap: 15 (ref 5–15)
BUN: 9 mg/dL (ref 6–20)
CO2: 24 mmol/L (ref 22–32)
Calcium: 9.9 mg/dL (ref 8.9–10.3)
Chloride: 100 mmol/L — ABNORMAL LOW (ref 101–111)
Creatinine, Ser: 0.69 mg/dL (ref 0.44–1.00)
GFR calc Af Amer: >60 mL/min (ref >60)
GFR calc non Af Amer: >60 mL/min (ref >60)
Glucose, Bld: 145 mg/dL — ABNORMAL HIGH (ref 65–99)
Potassium: 3.9 mmol/L (ref 3.5–5.1)
Sodium: 139 mmol/L (ref 135–145)
Total Bilirubin: 0.5 mg/dL (ref 0.3–1.2)
Total Protein: 7 g/dL (ref 6.5–8.1)

## 2015-08-13 LAB — CBC
HCT: 34.7 % — ABNORMAL LOW (ref 36.0–46.0)
Hemoglobin: 11.8 g/dL — ABNORMAL LOW (ref 12.0–15.0)
MCH: 30.7 pg (ref 26.0–34.0)
MCHC: 34 g/dL (ref 30.0–36.0)
MCV: 90.4 fL (ref 78.0–100.0)
Platelets: 277 10*3/uL (ref 150–400)
RBC: 3.84 MIL/uL — ABNORMAL LOW (ref 3.87–5.11)
RDW: 13.4 % (ref 11.5–15.5)
WBC: 11.1 10*3/uL — ABNORMAL HIGH (ref 4.0–10.5)

## 2015-08-13 LAB — URINALYSIS, ROUTINE W REFLEX MICROSCOPIC
Bilirubin Urine: NEGATIVE
Glucose, UA: NEGATIVE mg/dL
Hgb urine dipstick: NEGATIVE
Ketones, ur: NEGATIVE mg/dL
Leukocytes, UA: NEGATIVE
Nitrite: NEGATIVE
Protein, ur: NEGATIVE mg/dL
Specific Gravity, Urine: 1.003 — ABNORMAL LOW (ref 1.005–1.030)
pH: 6 (ref 5.0–8.0)

## 2015-08-13 LAB — LIPASE, BLOOD: Lipase: 27 U/L (ref 11–51)

## 2015-08-13 NOTE — ED Notes (Signed)
Patient here with LLQ abdominal pain for 6 weeks, has seen MD for same and no diagnosis. Reports some urinary hesitancy and today lower back pain. Reports that she feels bloated. No further nausea

## 2015-08-13 NOTE — ED Notes (Signed)
Patient left without being seen.

## 2015-08-15 ENCOUNTER — Emergency Department (HOSPITAL_BASED_OUTPATIENT_CLINIC_OR_DEPARTMENT_OTHER): Payer: 59

## 2015-08-15 ENCOUNTER — Emergency Department (HOSPITAL_BASED_OUTPATIENT_CLINIC_OR_DEPARTMENT_OTHER)
Admission: EM | Admit: 2015-08-15 | Discharge: 2015-08-15 | Disposition: A | Discharge status: Home / Self Care | Payer: 59 | Attending: Emergency Medicine | Admitting: Emergency Medicine

## 2015-08-15 ENCOUNTER — Encounter (HOSPITAL_BASED_OUTPATIENT_CLINIC_OR_DEPARTMENT_OTHER): Payer: Self-pay

## 2015-08-15 DIAGNOSIS — Z8619 Personal history of other infectious and parasitic diseases: Secondary | ICD-10-CM | POA: Diagnosis not present

## 2015-08-15 DIAGNOSIS — N83201 Unspecified ovarian cyst, right side: Secondary | ICD-10-CM | POA: Insufficient documentation

## 2015-08-15 DIAGNOSIS — D649 Anemia, unspecified: Secondary | ICD-10-CM | POA: Insufficient documentation

## 2015-08-15 DIAGNOSIS — R110 Nausea: Secondary | ICD-10-CM | POA: Insufficient documentation

## 2015-08-15 DIAGNOSIS — E119 Type 2 diabetes mellitus without complications: Secondary | ICD-10-CM | POA: Insufficient documentation

## 2015-08-15 DIAGNOSIS — I10 Essential (primary) hypertension: Secondary | ICD-10-CM | POA: Diagnosis not present

## 2015-08-15 DIAGNOSIS — Z87442 Personal history of urinary calculi: Secondary | ICD-10-CM | POA: Diagnosis not present

## 2015-08-15 DIAGNOSIS — Z8659 Personal history of other mental and behavioral disorders: Secondary | ICD-10-CM | POA: Diagnosis not present

## 2015-08-15 DIAGNOSIS — Z792 Long term (current) use of antibiotics: Secondary | ICD-10-CM | POA: Insufficient documentation

## 2015-08-15 DIAGNOSIS — R102 Pelvic and perineal pain: Secondary | ICD-10-CM

## 2015-08-15 DIAGNOSIS — Z3202 Encounter for pregnancy test, result negative: Secondary | ICD-10-CM | POA: Diagnosis not present

## 2015-08-15 DIAGNOSIS — Z880 Allergy status to penicillin: Secondary | ICD-10-CM | POA: Diagnosis not present

## 2015-08-15 DIAGNOSIS — Z79899 Other long term (current) drug therapy: Secondary | ICD-10-CM | POA: Insufficient documentation

## 2015-08-15 DIAGNOSIS — Z7984 Long term (current) use of oral hypoglycemic drugs: Secondary | ICD-10-CM | POA: Insufficient documentation

## 2015-08-15 DIAGNOSIS — Z791 Long term (current) use of non-steroidal anti-inflammatories (NSAID): Secondary | ICD-10-CM | POA: Diagnosis not present

## 2015-08-15 LAB — URINALYSIS, ROUTINE W REFLEX MICROSCOPIC
Bilirubin Urine: NEGATIVE
Glucose, UA: NEGATIVE mg/dL
Hgb urine dipstick: NEGATIVE
Ketones, ur: NEGATIVE mg/dL
Nitrite: NEGATIVE
Protein, ur: NEGATIVE mg/dL
Specific Gravity, Urine: 1.023 (ref 1.005–1.030)
pH: 6 (ref 5.0–8.0)

## 2015-08-15 LAB — CBC WITH DIFFERENTIAL/PLATELET
Basophils Absolute: 0 10*3/uL (ref 0.0–0.1)
Basophils Relative: 0 %
Eosinophils Absolute: 0.1 10*3/uL (ref 0.0–0.7)
Eosinophils Relative: 1 %
HCT: 32.7 % — ABNORMAL LOW (ref 36.0–46.0)
Hemoglobin: 11 g/dL — ABNORMAL LOW (ref 12.0–15.0)
Lymphocytes Relative: 18 %
Lymphs Abs: 1.9 10*3/uL (ref 0.7–4.0)
MCH: 31.1 pg (ref 26.0–34.0)
MCHC: 33.6 g/dL (ref 30.0–36.0)
MCV: 92.4 fL (ref 78.0–100.0)
Monocytes Absolute: 0.7 10*3/uL (ref 0.1–1.0)
Monocytes Relative: 6 %
Neutro Abs: 7.8 10*3/uL — ABNORMAL HIGH (ref 1.7–7.7)
Neutrophils Relative %: 75 %
Platelets: 240 10*3/uL (ref 150–400)
RBC: 3.54 MIL/uL — ABNORMAL LOW (ref 3.87–5.11)
RDW: 13.5 % (ref 11.5–15.5)
WBC: 10.5 10*3/uL (ref 4.0–10.5)

## 2015-08-15 LAB — COMPREHENSIVE METABOLIC PANEL
ALT: 66 U/L — ABNORMAL HIGH (ref 14–54)
AST: 29 U/L (ref 15–41)
Albumin: 4.1 g/dL (ref 3.5–5.0)
Alkaline Phosphatase: 84 U/L (ref 38–126)
Anion gap: 8 (ref 5–15)
BUN: 12 mg/dL (ref 6–20)
CO2: 25 mmol/L (ref 22–32)
Calcium: 9 mg/dL (ref 8.9–10.3)
Chloride: 105 mmol/L (ref 101–111)
Creatinine, Ser: 0.65 mg/dL (ref 0.44–1.00)
GFR calc Af Amer: >60 mL/min (ref >60)
GFR calc non Af Amer: >60 mL/min (ref >60)
Glucose, Bld: 169 mg/dL — ABNORMAL HIGH (ref 65–99)
Potassium: 4.1 mmol/L (ref 3.5–5.1)
Sodium: 138 mmol/L (ref 135–145)
Total Bilirubin: 0.7 mg/dL (ref 0.3–1.2)
Total Protein: 6.8 g/dL (ref 6.5–8.1)

## 2015-08-15 LAB — WET PREP, GENITAL
Sperm: NONE SEEN
Trich, Wet Prep: NONE SEEN
Yeast Wet Prep HPF POC: NONE SEEN

## 2015-08-15 LAB — URINE MICROSCOPIC-ADD ON

## 2015-08-15 LAB — PREGNANCY, URINE: Preg Test, Ur: NEGATIVE

## 2015-08-15 NOTE — ED Notes (Signed)
PT reports 3-4 weeks of pelvic pain and lower back pain. Has been seen by PCP, urologist, states urology seen her yesterday and done an ultrasound of her left kidney and stated she did not have a renal stone. Pt states that today she developed vaginal pressure. Pt states her abdomen is swelling. Pt reports OBGYN scheduled ultrasound in Feb. Yearly PAP smear due in April. Pt with IUD in place. PCP did not do a complete pelvic exam but on bimanual exam pt reported significant LLQ pain but PCP stated it was her bladder.

## 2015-08-15 NOTE — ED Provider Notes (Addendum)
CSN: [id number]     Arrival date & time 08/15/15  0913 History   First MD Initiated Contact with Patient 08/15/15 [phone]     Chief Complaint  Patient presents with  . Pelvic Pain     (Consider location/radiation/quality/duration/timing/severity/associated sxs/prior Treatment) HPI Comments: Patient is a 45 year old female with a history of hypertension, bipolar disease, diabetes and kidney stones who presents with a 6 week history of left flank and left lower quadrant and pelvic tenderness. She states approximately 6 weeks ago she noticed coffee ground urine for 1 day and then the pain started. The pain is been intermittent. Approximately 3 weeks ago she saw her PCP and at that time had a normal urine in the office and was sent for a CT stone study. CT was negative and patient states the pain has continued to wax and wane. In the last few weeks it has become more constant and she saw her urologist yesterday who did a renal ultrasound in the office which was negative. He recommended she follow-up with OB/GYN for further evaluation. She has no point low through OB/GYN in for ultrasound but it cannot be completed until February 7. Because of the ongoing pain she came here for sooner evaluation. She states the pain is slightly better today than what it was but she has never had anything like this before. She denies any dysuria, fever, rashes has noted bloating but no change in bowel movements.  Patient is a 45 y.o. female presenting with abdominal pain. The history is provided by the patient.  Abdominal Pain Pain location:  L flank and LLQ Pain quality: aching, cramping, sharp and shooting   Pain radiates to:  L flank Pain severity:  Moderate Onset quality:  Gradual Duration:  6 weeks Timing:  Intermittent Progression:  Waxing and waning Chronicity:  New Context: awakening from sleep   Context: not eating, not previous surgeries, not recent sexual activity, not sick contacts and not trauma    Relieved by:  NSAIDs Worsened by:  Nothing tried Associated symptoms: nausea   Associated symptoms: no anorexia, no constipation, no cough, no diarrhea, no dysuria, no fever, no hematuria, no shortness of breath, no vaginal bleeding, no vaginal discharge and no vomiting   Risk factors comment:  Hx of kidney stones   Past Medical History  Diagnosis Date  . Hypertension   . Bipolar disorder (HCC)   . HSV-1 (herpes simplex virus 1) infection   . Diabetes mellitus without complication (HCC)   . Collapsed lung     "spontaneous" , teenager  . Scoliosis (and kyphoscoliosis), idiopathic   . Anemia   . Kidney stones    Past Surgical History  Procedure Laterality Date  . Chest tube insertion     Family History  Problem Relation Age of Onset  . Hyperlipidemia Mother   . Hypertension Mother   . Hyperlipidemia Father   . Hypertension Father   . CAD Father   . Heart disease Father   . Alzheimer's disease Maternal Grandmother 12   Social History  Substance Use Topics  . Smoking status: Never Smoker   . Smokeless tobacco: Never Used  . Alcohol Use: No     Comment: occ   OB History    No data available     Review of Systems  Constitutional: Negative for fever.  Respiratory: Negative for cough and shortness of breath.   Gastrointestinal: Positive for nausea and abdominal pain. Negative for vomiting, diarrhea, constipation and anorexia.  Genitourinary: Negative for  dysuria, hematuria, vaginal bleeding and vaginal discharge.  All other systems reviewed and are negative.     Allergies  Tetanus toxoids; Ceclor; Penicillins; and Sulfa antibiotics  Home Medications   Prior to Admission medications   Medication Sig Start Date End Date Taking? Authorizing Provider  Cyanocobalamin (VITAMIN B-12 PO) Take 1,200 mcg by mouth daily.   Yes Historical Provider, MD  ferrous gluconate (FERGON) 324 MG tablet Take 1 tablet (324 mg total) by mouth daily. 03/05/15  Yes Renee A Kuneff, DO   levonorgestrel (MIRENA) 20 MCG/24HR IUD 1 each by Intrauterine route once.   Yes Historical Provider, MD  metFORMIN (GLUCOPHAGE) 500 MG tablet Take 1 tablet (500 mg total) by mouth daily with breakfast. 04/13/15 08/15/15 Yes Renee A Kuneff, DO  Cholecalciferol (D3 MAXIMUM STRENGTH) 5000 UNIT/ML LIQD Take 1 mL (5,000 Units total) by mouth daily with supper. Patient not taking: Reported on 06/20/2015 09/28/14   Kelle Darting, NP  ciprofloxacin (CIPRO) 500 MG tablet Take 1 tablet (500 mg total) by mouth 2 (two) times daily. 07/19/15   Renee A Kuneff, DO  clotrimazole (LOTRIMIN) 1 % cream Apply 1 application topically 2 (two) times daily. 07/19/15   Renee A Kuneff, DO  naproxen (NAPROSYN) 500 MG tablet Take 1 tablet (500 mg total) by mouth 2 (two) times daily with a meal. 07/19/15   Renee A Kuneff, DO  ondansetron (ZOFRAN) 4 MG tablet Take 1 tablet (4 mg total) by mouth every 8 (eight) hours as needed for nausea or vomiting. 07/19/15   Renee A Kuneff, DO   BP 135/79 mmHg  Pulse 93  Temp(Src) 97.9 F (36.6 C) (Oral)  Resp 18  Ht  (1.651 m)  Wt 150 lb (68.04 kg)  BMI 24.96 kg/m2  SpO2 100% Physical Exam  Constitutional: She is oriented to person, place, and time. She appears well-developed and well-nourished. No distress.  HENT:  Head: Normocephalic and atraumatic.  Mouth/Throat: Oropharynx is clear and moist.  Eyes: Conjunctivae and EOM are normal. Pupils are equal, round, and reactive to light.  Neck: Normal range of motion. Neck supple.  Cardiovascular: Normal rate, regular rhythm and intact distal pulses.   No murmur heard. Pulmonary/Chest: Effort normal and breath sounds normal. No respiratory distress. She has no wheezes. She has no rales.  Abdominal: Soft. She exhibits no distension. There is tenderness in the left lower quadrant. There is CVA tenderness. There is no rebound and no guarding.  Left CVA tenderness  Genitourinary: Vagina normal and uterus normal. There is no  tenderness on the right labia. There is no tenderness on the left labia. Cervix exhibits no motion tenderness and no friability. Right adnexum displays no mass, no tenderness and no fullness. Left adnexum displays tenderness. No tenderness or bleeding in the vagina.  IUD strings present and some mild thin clear discharge  Musculoskeletal: Normal range of motion. She exhibits no edema or tenderness.  Neurological: She is alert and oriented to person, place, and time.  Skin: Skin is warm and dry. No rash noted. No erythema.  Psychiatric: She has a normal mood and affect. Her behavior is normal.  Nursing note and vitals reviewed.   ED Course  Procedures (including critical care time) Labs Review Labs Reviewed  WET PREP, GENITAL - Abnormal; Notable for the following:    Clue Cells Wet Prep HPF POC PRESENT (*)    WBC, Wet Prep HPF POC MANY (*)    All other components within normal limits  URINALYSIS, ROUTINE W  REFLEX MICROSCOPIC (NOT AT [facility]) - Abnormal; Notable for the following:    APPearance CLOUDY (*)    Leukocytes, UA SMALL (*)    All other components within normal limits  CBC WITH DIFFERENTIAL/PLATELET - Abnormal; Notable for the following:    RBC 3.54 (*)    Hemoglobin 11.0 (*)    HCT 32.7 (*)    Neutro Abs 7.8 (*)    All other components within normal limits  COMPREHENSIVE METABOLIC PANEL - Abnormal; Notable for the following:    Glucose, Bld 169 (*)    ALT 66 (*)    All other components within normal limits  URINE MICROSCOPIC-ADD ON - Abnormal; Notable for the following:    Squamous Epithelial / LPF 0-5 (*)    Bacteria, UA MANY (*)    All other components within normal limits  PREGNANCY, URINE    Imaging Review US Transvaginal Non-ob  08/15/2015  CLINICAL DATA:  Left lower quadrant pain and pressure with low back pain for 3-4 weeks EXAM: TRANSABDOMINAL AND TRANSVAGINAL ULTRASOUND OF PELVIS TECHNIQUE: Both transabdominal and transvaginal ultrasound examinations of the  pelvis were performed. Transabdominal technique was performed for global imaging of the pelvis including uterus, ovaries, adnexal regions, and pelvic cul-de-sac. It was necessary to proceed with endovaginal exam following the transabdominal exam to visualize the ovaries and endometrium. COMPARISON:  None FINDINGS: Uterus Measurements: 7.2 x 4.1 x 4.5 cm. No fibroids or other mass visualized. Endometrium Thickness: 8 mm. Intrauterine device in satisfactory position. No focal abnormality visualized. Right ovary Measurements: 5 x 3.2 x 2.2 cm. 2.1 x 2.3 x 1.3 cm anechoic right ovarian mass most consistent with a cyst. Mildly complex cysts measuring 2.2 x 1.8 x 1.6 cm. Left ovary Not visualized. Other findings Trace pelvic free fluid likely physiologic. IMPRESSION: 1. Simple right ovarian cyst measuring 2.1 x 2.3 x 1.3 cm. Mildly complicated right ovarian cyst measuring 2.2 x 1.8 x 1.6 cm. 2. Intrauterine device in satisfactory position. Electronically Signed   By: Elige Ko   On: 08/15/2015 12:30   US Pelvis Complete  08/15/2015  CLINICAL DATA:  Left lower quadrant pain and pressure with low back pain for 3-4 weeks EXAM: TRANSABDOMINAL AND TRANSVAGINAL ULTRASOUND OF PELVIS TECHNIQUE: Both transabdominal and transvaginal ultrasound examinations of the pelvis were performed. Transabdominal technique was performed for global imaging of the pelvis including uterus, ovaries, adnexal regions, and pelvic cul-de-sac. It was necessary to proceed with endovaginal exam following the transabdominal exam to visualize the ovaries and endometrium. COMPARISON:  None FINDINGS: Uterus Measurements: 7.2 x 4.1 x 4.5 cm. No fibroids or other mass visualized. Endometrium Thickness: 8 mm. Intrauterine device in satisfactory position. No focal abnormality visualized. Right ovary Measurements: 5 x 3.2 x 2.2 cm. 2.1 x 2.3 x 1.3 cm anechoic right ovarian mass most consistent with a cyst. Mildly complex cysts measuring 2.2 x 1.8 x 1.6 cm.  Left ovary Not visualized. Other findings Trace pelvic free fluid likely physiologic. IMPRESSION: 1. Simple right ovarian cyst measuring 2.1 x 2.3 x 1.3 cm. Mildly complicated right ovarian cyst measuring 2.2 x 1.8 x 1.6 cm. 2. Intrauterine device in satisfactory position. Electronically Signed   By: Elige Ko   On: 08/15/2015 12:30   I have personally reviewed and evaluated these images and lab results as part of my medical decision-making.   EKG Interpretation None      MDM   Final diagnoses:  Cyst of right ovary    Patient is a 45 year old female with  a history of diabetes and kidney stones who presents today with a 6 week history of intermittent worsening left lower quadrant and flank tenderness. Patient saw her urologist yesterday and had a normal renal ultrasound. She saw our PCP approximate 3 weeks ago with similar symptoms and had a negative stone study. Patient complains of a lot of pressure in her pelvic region but denies any dysuria or bowel changes. Intermittent nausea but denies fever or vomiting. Patient is not currently sexually active for many years but uses the IUD to avoid periods. She denies any discharge and pelvic exam with left ovarian tenderness but no other significant findings. IUD strings are present.  Concern for possible ovarian pathology versus UTI. Low suspicion for kidney stone as patient has had multiple images for this. Lower suspicion for bowel pathology as she had a normal noncontrast scan with normal CBC, CMP. Patient's wet prep shows clue cells only which would not be the cause of her symptoms. UA within normal limits other than mild contamination from being a clean catch.  Pelvic ultrasound pending  12:44 PM US shows right sided cysts and left ovary is not seen however could still be the cause of pain.  Labs without acute findings.  Will have pt f/u with OB/gyn   Gwyneth Sprout, MD 08/15/15 1247  Gwyneth Sprout, MD 08/15/15 1309

## 2015-09-05 ENCOUNTER — Ambulatory Visit (INDEPENDENT_AMBULATORY_CARE_PROVIDER_SITE_OTHER): Payer: 59 | Admitting: Psychology

## 2015-09-05 DIAGNOSIS — F411 Generalized anxiety disorder: Secondary | ICD-10-CM

## 2015-09-07 ENCOUNTER — Ambulatory Visit (INDEPENDENT_AMBULATORY_CARE_PROVIDER_SITE_OTHER): Payer: 59 | Admitting: Psychology

## 2015-09-07 DIAGNOSIS — F411 Generalized anxiety disorder: Secondary | ICD-10-CM

## 2015-09-19 ENCOUNTER — Other Ambulatory Visit: Payer: Self-pay | Admitting: *Deleted

## 2015-09-19 ENCOUNTER — Ambulatory Visit (INDEPENDENT_AMBULATORY_CARE_PROVIDER_SITE_OTHER): Payer: 59 | Admitting: Psychology

## 2015-09-19 DIAGNOSIS — F411 Generalized anxiety disorder: Secondary | ICD-10-CM

## 2015-09-19 DIAGNOSIS — D509 Iron deficiency anemia, unspecified: Secondary | ICD-10-CM

## 2015-09-19 MED ORDER — FERROUS GLUCONATE 324 (38 FE) MG PO TABS: 324.0000 mg | ORAL_TABLET | Freq: Every day | ORAL | Status: DC

## 2015-09-21 ENCOUNTER — Ambulatory Visit (INDEPENDENT_AMBULATORY_CARE_PROVIDER_SITE_OTHER): Payer: 59 | Admitting: Psychology

## 2015-09-21 DIAGNOSIS — F411 Generalized anxiety disorder: Secondary | ICD-10-CM

## 2015-09-27 ENCOUNTER — Ambulatory Visit (INDEPENDENT_AMBULATORY_CARE_PROVIDER_SITE_OTHER): Payer: 59 | Admitting: Psychology

## 2015-09-27 DIAGNOSIS — F411 Generalized anxiety disorder: Secondary | ICD-10-CM | POA: Diagnosis not present

## 2015-10-04 ENCOUNTER — Ambulatory Visit (INDEPENDENT_AMBULATORY_CARE_PROVIDER_SITE_OTHER): Payer: 59 | Admitting: Psychology

## 2015-10-04 DIAGNOSIS — F4310 Post-traumatic stress disorder, unspecified: Secondary | ICD-10-CM

## 2015-10-04 DIAGNOSIS — F4481 Dissociative identity disorder: Secondary | ICD-10-CM

## 2015-10-11 ENCOUNTER — Ambulatory Visit (INDEPENDENT_AMBULATORY_CARE_PROVIDER_SITE_OTHER): Payer: 59 | Admitting: Psychology

## 2015-10-11 DIAGNOSIS — F411 Generalized anxiety disorder: Secondary | ICD-10-CM

## 2015-10-15 ENCOUNTER — Other Ambulatory Visit: Payer: Self-pay | Admitting: *Deleted

## 2015-10-15 DIAGNOSIS — E119 Type 2 diabetes mellitus without complications: Secondary | ICD-10-CM

## 2015-10-15 MED ORDER — METFORMIN HCL 500 MG PO TABS: 500.0000 mg | ORAL_TABLET | Freq: Every day | ORAL | Status: DC

## 2015-10-16 ENCOUNTER — Ambulatory Visit: Payer: 59 | Admitting: Psychology

## 2015-10-18 ENCOUNTER — Other Ambulatory Visit: Payer: Self-pay | Admitting: *Deleted

## 2015-10-18 ENCOUNTER — Ambulatory Visit: Payer: 59 | Admitting: Psychology

## 2015-10-18 MED ORDER — GLUCOSE BLOOD VI STRP: ORAL_STRIP | Status: DC

## 2015-10-18 NOTE — Telephone Encounter (Signed)
Patient's Rx for glucometer test strips sent to pharmacy

## 2015-10-22 ENCOUNTER — Other Ambulatory Visit: Payer: Self-pay | Admitting: *Deleted

## 2015-10-22 DIAGNOSIS — E119 Type 2 diabetes mellitus without complications: Secondary | ICD-10-CM

## 2015-10-22 MED ORDER — METFORMIN HCL 500 MG PO TABS: 500.0000 mg | ORAL_TABLET | Freq: Every day | ORAL | Status: DC

## 2015-10-23 ENCOUNTER — Ambulatory Visit (INDEPENDENT_AMBULATORY_CARE_PROVIDER_SITE_OTHER): Payer: 59 | Admitting: Psychology

## 2015-10-23 DIAGNOSIS — F411 Generalized anxiety disorder: Secondary | ICD-10-CM | POA: Diagnosis not present

## 2015-10-24 ENCOUNTER — Telehealth: Payer: Self-pay | Admitting: Neurology

## 2015-10-24 NOTE — Telephone Encounter (Signed)
Lmovm to rtn my call. 

## 2015-10-24 NOTE — Telephone Encounter (Signed)
Selena Holt [id number] called regarding a Spinal tap that was to be set up for her. Her call back number is [phone]. Thank you

## 2015-10-25 ENCOUNTER — Ambulatory Visit: Payer: 59 | Admitting: Psychology

## 2015-10-29 NOTE — Telephone Encounter (Signed)
Lmovm to rtn my call. 

## 2015-10-30 ENCOUNTER — Ambulatory Visit (INDEPENDENT_AMBULATORY_CARE_PROVIDER_SITE_OTHER): Payer: 59 | Admitting: Psychology

## 2015-10-30 DIAGNOSIS — F411 Generalized anxiety disorder: Secondary | ICD-10-CM | POA: Diagnosis not present

## 2015-11-06 ENCOUNTER — Ambulatory Visit (INDEPENDENT_AMBULATORY_CARE_PROVIDER_SITE_OTHER): Payer: 59 | Admitting: Psychology

## 2015-11-06 DIAGNOSIS — F4481 Dissociative identity disorder: Secondary | ICD-10-CM

## 2015-11-13 ENCOUNTER — Ambulatory Visit (INDEPENDENT_AMBULATORY_CARE_PROVIDER_SITE_OTHER): Payer: 59 | Admitting: Psychology

## 2015-11-13 DIAGNOSIS — F4481 Dissociative identity disorder: Secondary | ICD-10-CM | POA: Diagnosis not present

## 2015-11-20 ENCOUNTER — Ambulatory Visit (INDEPENDENT_AMBULATORY_CARE_PROVIDER_SITE_OTHER): Payer: 59 | Admitting: Psychology

## 2015-11-20 DIAGNOSIS — F449 Dissociative and conversion disorder, unspecified: Secondary | ICD-10-CM

## 2015-11-27 ENCOUNTER — Ambulatory Visit: Payer: 59 | Admitting: Psychology

## 2015-12-04 ENCOUNTER — Ambulatory Visit: Payer: 59 | Admitting: Psychology

## 2016-01-28 ENCOUNTER — Other Ambulatory Visit: Payer: Self-pay | Admitting: *Deleted

## 2016-01-28 DIAGNOSIS — D509 Iron deficiency anemia, unspecified: Secondary | ICD-10-CM

## 2016-01-28 MED ORDER — FERROUS GLUCONATE 324 (38 FE) MG PO TABS: 324.0000 mg | ORAL_TABLET | Freq: Every day | ORAL | Status: DC

## 2016-01-29 ENCOUNTER — Ambulatory Visit: Payer: 59 | Admitting: Psychology

## 2016-02-19 ENCOUNTER — Ambulatory Visit: Payer: 59 | Admitting: Psychology

## 2016-02-26 ENCOUNTER — Ambulatory Visit: Payer: 59 | Admitting: Psychology

## 2016-03-11 ENCOUNTER — Ambulatory Visit (INDEPENDENT_AMBULATORY_CARE_PROVIDER_SITE_OTHER): Payer: 59 | Admitting: Psychology

## 2016-03-11 DIAGNOSIS — F4481 Dissociative identity disorder: Secondary | ICD-10-CM

## 2016-03-18 ENCOUNTER — Ambulatory Visit (INDEPENDENT_AMBULATORY_CARE_PROVIDER_SITE_OTHER): Payer: 59 | Admitting: Psychology

## 2016-03-18 DIAGNOSIS — F4481 Dissociative identity disorder: Secondary | ICD-10-CM

## 2016-04-08 ENCOUNTER — Ambulatory Visit (INDEPENDENT_AMBULATORY_CARE_PROVIDER_SITE_OTHER): Payer: 59 | Admitting: Psychology

## 2016-04-08 DIAGNOSIS — F4481 Dissociative identity disorder: Secondary | ICD-10-CM

## 2016-04-15 ENCOUNTER — Ambulatory Visit (INDEPENDENT_AMBULATORY_CARE_PROVIDER_SITE_OTHER): Payer: BLUE CROSS/BLUE SHIELD | Admitting: Psychology

## 2016-04-15 DIAGNOSIS — F4481 Dissociative identity disorder: Secondary | ICD-10-CM

## 2016-04-15 DIAGNOSIS — F4310 Post-traumatic stress disorder, unspecified: Secondary | ICD-10-CM

## 2016-04-22 ENCOUNTER — Ambulatory Visit (INDEPENDENT_AMBULATORY_CARE_PROVIDER_SITE_OTHER): Payer: Self-pay | Admitting: Psychology

## 2016-04-22 DIAGNOSIS — F4310 Post-traumatic stress disorder, unspecified: Secondary | ICD-10-CM

## 2016-04-22 DIAGNOSIS — F4481 Dissociative identity disorder: Secondary | ICD-10-CM

## 2016-04-29 ENCOUNTER — Ambulatory Visit (INDEPENDENT_AMBULATORY_CARE_PROVIDER_SITE_OTHER): Payer: Self-pay | Admitting: Psychology

## 2016-04-29 DIAGNOSIS — F4310 Post-traumatic stress disorder, unspecified: Secondary | ICD-10-CM

## 2016-04-29 DIAGNOSIS — F4481 Dissociative identity disorder: Secondary | ICD-10-CM

## 2016-05-06 ENCOUNTER — Ambulatory Visit (INDEPENDENT_AMBULATORY_CARE_PROVIDER_SITE_OTHER): Payer: Self-pay | Admitting: Psychology

## 2016-05-06 DIAGNOSIS — F4310 Post-traumatic stress disorder, unspecified: Secondary | ICD-10-CM

## 2016-05-06 DIAGNOSIS — F4481 Dissociative identity disorder: Secondary | ICD-10-CM

## 2016-05-09 DIAGNOSIS — E559 Vitamin D deficiency, unspecified: Secondary | ICD-10-CM | POA: Diagnosis not present

## 2016-05-09 DIAGNOSIS — D509 Iron deficiency anemia, unspecified: Secondary | ICD-10-CM | POA: Diagnosis not present

## 2016-05-09 DIAGNOSIS — E538 Deficiency of other specified B group vitamins: Secondary | ICD-10-CM | POA: Diagnosis not present

## 2016-05-09 DIAGNOSIS — E785 Hyperlipidemia, unspecified: Secondary | ICD-10-CM | POA: Diagnosis not present

## 2016-05-09 DIAGNOSIS — Z1231 Encounter for screening mammogram for malignant neoplasm of breast: Secondary | ICD-10-CM | POA: Diagnosis not present

## 2016-05-09 DIAGNOSIS — E119 Type 2 diabetes mellitus without complications: Secondary | ICD-10-CM | POA: Diagnosis not present

## 2016-05-09 DIAGNOSIS — Z23 Encounter for immunization: Secondary | ICD-10-CM | POA: Diagnosis not present

## 2016-05-13 ENCOUNTER — Ambulatory Visit (INDEPENDENT_AMBULATORY_CARE_PROVIDER_SITE_OTHER): Payer: BLUE CROSS/BLUE SHIELD | Admitting: Psychology

## 2016-05-13 DIAGNOSIS — F4481 Dissociative identity disorder: Secondary | ICD-10-CM

## 2016-05-13 DIAGNOSIS — F4310 Post-traumatic stress disorder, unspecified: Secondary | ICD-10-CM | POA: Diagnosis not present

## 2016-05-17 DIAGNOSIS — F320 Major depressive disorder, single episode, mild: Secondary | ICD-10-CM | POA: Diagnosis not present

## 2016-05-17 DIAGNOSIS — Z6824 Body mass index (BMI) 24.0-24.9, adult: Secondary | ICD-10-CM | POA: Diagnosis not present

## 2016-05-20 ENCOUNTER — Ambulatory Visit (INDEPENDENT_AMBULATORY_CARE_PROVIDER_SITE_OTHER): Payer: BLUE CROSS/BLUE SHIELD | Admitting: Psychology

## 2016-05-20 DIAGNOSIS — F4310 Post-traumatic stress disorder, unspecified: Secondary | ICD-10-CM | POA: Diagnosis not present

## 2016-05-20 DIAGNOSIS — F4481 Dissociative identity disorder: Secondary | ICD-10-CM

## 2016-05-27 ENCOUNTER — Ambulatory Visit (INDEPENDENT_AMBULATORY_CARE_PROVIDER_SITE_OTHER): Payer: BLUE CROSS/BLUE SHIELD | Admitting: Psychology

## 2016-05-27 DIAGNOSIS — F4310 Post-traumatic stress disorder, unspecified: Secondary | ICD-10-CM

## 2016-05-27 DIAGNOSIS — F4481 Dissociative identity disorder: Secondary | ICD-10-CM | POA: Diagnosis not present

## 2016-06-03 ENCOUNTER — Ambulatory Visit (INDEPENDENT_AMBULATORY_CARE_PROVIDER_SITE_OTHER): Payer: BLUE CROSS/BLUE SHIELD | Admitting: Psychology

## 2016-06-03 DIAGNOSIS — F4481 Dissociative identity disorder: Secondary | ICD-10-CM | POA: Diagnosis not present

## 2016-06-03 DIAGNOSIS — F4310 Post-traumatic stress disorder, unspecified: Secondary | ICD-10-CM

## 2016-06-10 ENCOUNTER — Ambulatory Visit (INDEPENDENT_AMBULATORY_CARE_PROVIDER_SITE_OTHER): Payer: BLUE CROSS/BLUE SHIELD | Admitting: Psychology

## 2016-06-10 DIAGNOSIS — F4310 Post-traumatic stress disorder, unspecified: Secondary | ICD-10-CM

## 2016-06-10 DIAGNOSIS — F4481 Dissociative identity disorder: Secondary | ICD-10-CM | POA: Diagnosis not present

## 2016-06-17 ENCOUNTER — Ambulatory Visit (INDEPENDENT_AMBULATORY_CARE_PROVIDER_SITE_OTHER): Payer: BLUE CROSS/BLUE SHIELD | Admitting: Psychology

## 2016-06-17 DIAGNOSIS — F449 Dissociative and conversion disorder, unspecified: Secondary | ICD-10-CM

## 2016-06-17 DIAGNOSIS — F4310 Post-traumatic stress disorder, unspecified: Secondary | ICD-10-CM

## 2016-06-20 DIAGNOSIS — Z6824 Body mass index (BMI) 24.0-24.9, adult: Secondary | ICD-10-CM | POA: Diagnosis not present

## 2016-06-20 DIAGNOSIS — J0100 Acute maxillary sinusitis, unspecified: Secondary | ICD-10-CM | POA: Diagnosis not present

## 2016-07-01 ENCOUNTER — Ambulatory Visit: Payer: Self-pay | Admitting: Psychology

## 2016-07-08 ENCOUNTER — Ambulatory Visit (INDEPENDENT_AMBULATORY_CARE_PROVIDER_SITE_OTHER): Payer: BLUE CROSS/BLUE SHIELD | Admitting: Psychology

## 2016-07-08 DIAGNOSIS — F4481 Dissociative identity disorder: Secondary | ICD-10-CM

## 2016-07-08 DIAGNOSIS — F4310 Post-traumatic stress disorder, unspecified: Secondary | ICD-10-CM | POA: Diagnosis not present

## 2016-07-11 DIAGNOSIS — B029 Zoster without complications: Secondary | ICD-10-CM | POA: Diagnosis not present

## 2016-07-13 ENCOUNTER — Observation Stay (HOSPITAL_BASED_OUTPATIENT_CLINIC_OR_DEPARTMENT_OTHER)
Admission: EM | Admit: 2016-07-13 | Discharge: 2016-07-14 | DRG: 099 | Disposition: A | Discharge status: Home / Self Care | Payer: BLUE CROSS/BLUE SHIELD | Attending: Internal Medicine | Admitting: Internal Medicine

## 2016-07-13 ENCOUNTER — Encounter (HOSPITAL_BASED_OUTPATIENT_CLINIC_OR_DEPARTMENT_OTHER): Payer: Self-pay | Admitting: *Deleted

## 2016-07-13 ENCOUNTER — Emergency Department (HOSPITAL_BASED_OUTPATIENT_CLINIC_OR_DEPARTMENT_OTHER): Payer: BLUE CROSS/BLUE SHIELD

## 2016-07-13 DIAGNOSIS — G039 Meningitis, unspecified: Secondary | ICD-10-CM | POA: Diagnosis not present

## 2016-07-13 DIAGNOSIS — Z888 Allergy status to other drugs, medicaments and biological substances status: Secondary | ICD-10-CM

## 2016-07-13 DIAGNOSIS — I10 Essential (primary) hypertension: Secondary | ICD-10-CM | POA: Diagnosis present

## 2016-07-13 DIAGNOSIS — E119 Type 2 diabetes mellitus without complications: Secondary | ICD-10-CM | POA: Diagnosis present

## 2016-07-13 DIAGNOSIS — J320 Chronic maxillary sinusitis: Secondary | ICD-10-CM | POA: Diagnosis present

## 2016-07-13 DIAGNOSIS — Z880 Allergy status to penicillin: Secondary | ICD-10-CM | POA: Diagnosis not present

## 2016-07-13 DIAGNOSIS — Z881 Allergy status to other antibiotic agents status: Secondary | ICD-10-CM

## 2016-07-13 DIAGNOSIS — B009 Herpesviral infection, unspecified: Secondary | ICD-10-CM | POA: Diagnosis present

## 2016-07-13 DIAGNOSIS — Z79899 Other long term (current) drug therapy: Secondary | ICD-10-CM

## 2016-07-13 DIAGNOSIS — F319 Bipolar disorder, unspecified: Secondary | ICD-10-CM | POA: Diagnosis present

## 2016-07-13 DIAGNOSIS — Z7984 Long term (current) use of oral hypoglycemic drugs: Secondary | ICD-10-CM

## 2016-07-13 DIAGNOSIS — B029 Zoster without complications: Secondary | ICD-10-CM | POA: Diagnosis not present

## 2016-07-13 DIAGNOSIS — D509 Iron deficiency anemia, unspecified: Secondary | ICD-10-CM | POA: Diagnosis present

## 2016-07-13 DIAGNOSIS — G030 Nonpyogenic meningitis: Principal | ICD-10-CM | POA: Diagnosis present

## 2016-07-13 DIAGNOSIS — Z8661 Personal history of infections of the central nervous system: Secondary | ICD-10-CM | POA: Diagnosis not present

## 2016-07-13 DIAGNOSIS — B021 Zoster meningitis: Secondary | ICD-10-CM | POA: Diagnosis not present

## 2016-07-13 DIAGNOSIS — R51 Headache: Secondary | ICD-10-CM | POA: Diagnosis not present

## 2016-07-13 DIAGNOSIS — R102 Pelvic and perineal pain: Secondary | ICD-10-CM

## 2016-07-13 DIAGNOSIS — Z8249 Family history of ischemic heart disease and other diseases of the circulatory system: Secondary | ICD-10-CM | POA: Diagnosis not present

## 2016-07-13 LAB — URINALYSIS, MICROSCOPIC (REFLEX): RBC / HPF: NONE SEEN RBC/hpf (ref 0–5)

## 2016-07-13 LAB — COMPREHENSIVE METABOLIC PANEL
ALT: 70 U/L — ABNORMAL HIGH (ref 14–54)
AST: 38 U/L (ref 15–41)
Albumin: 4.8 g/dL (ref 3.5–5.0)
Alkaline Phosphatase: 86 U/L (ref 38–126)
Anion gap: 8 (ref 5–15)
BUN: 9 mg/dL (ref 6–20)
CO2: 23 mmol/L (ref 22–32)
Calcium: 9.5 mg/dL (ref 8.9–10.3)
Chloride: 105 mmol/L (ref 101–111)
Creatinine, Ser: 0.65 mg/dL (ref 0.44–1.00)
GFR calc Af Amer: >60 mL/min (ref >60)
GFR calc non Af Amer: >60 mL/min (ref >60)
Glucose, Bld: 128 mg/dL — ABNORMAL HIGH (ref 65–99)
Potassium: 3.7 mmol/L (ref 3.5–5.1)
Sodium: 136 mmol/L (ref 135–145)
Total Bilirubin: 0.6 mg/dL (ref 0.3–1.2)
Total Protein: 8 g/dL (ref 6.5–8.1)

## 2016-07-13 LAB — GLUCOSE, CAPILLARY
Glucose-Capillary: 160 mg/dL — ABNORMAL HIGH (ref 65–99)
Glucose-Capillary: 153 mg/dL — ABNORMAL HIGH (ref 65–99)

## 2016-07-13 LAB — INFLUENZA PANEL BY PCR (TYPE A & B)
Influenza A By PCR: NEGATIVE
Influenza B By PCR: NEGATIVE

## 2016-07-13 LAB — CSF CELL COUNT WITH DIFFERENTIAL
Lymphs, CSF: 83 % — ABNORMAL HIGH (ref 40–80)
Lymphs, CSF: 84 % — ABNORMAL HIGH (ref 40–80)
Monocyte-Macrophage-Spinal Fluid: 4 % — ABNORMAL LOW (ref 15–45)
Monocyte-Macrophage-Spinal Fluid: 16 % (ref 15–45)
RBC Count, CSF: 81 /mm3 — ABNORMAL HIGH
RBC Count, CSF: 1 /mm3 — ABNORMAL HIGH
Segmented Neutrophils-CSF: 4 % (ref 0–6)
Tube #: 1
Tube #: 4
WBC, CSF: 49 /mm3 (ref 0–5)
WBC, CSF: 57 /mm3 (ref 0–5)

## 2016-07-13 LAB — URINALYSIS, ROUTINE W REFLEX MICROSCOPIC
Bilirubin Urine: NEGATIVE
Glucose, UA: NEGATIVE mg/dL
Hgb urine dipstick: NEGATIVE
Ketones, ur: NEGATIVE mg/dL
Nitrite: NEGATIVE
Protein, ur: NEGATIVE mg/dL
Specific Gravity, Urine: 1.005 (ref 1.005–1.030)
pH: 6.5 (ref 5.0–8.0)

## 2016-07-13 LAB — CREATININE, SERUM
Creatinine, Ser: 0.62 mg/dL (ref 0.44–1.00)
GFR calc Af Amer: >60 mL/min (ref >60)
GFR calc non Af Amer: >60 mL/min (ref >60)

## 2016-07-13 LAB — PREGNANCY, URINE: Preg Test, Ur: NEGATIVE

## 2016-07-13 LAB — CRYPTOCOCCAL ANTIGEN, CSF: Crypto Ag: NEGATIVE

## 2016-07-13 LAB — PROTEIN, CSF: Total  Protein, CSF: 63 mg/dL — ABNORMAL HIGH (ref 15–45)

## 2016-07-13 LAB — I-STAT CG4 LACTIC ACID, ED: Lactic Acid, Venous: 1.05 mmol/L (ref 0.5–1.9)

## 2016-07-13 LAB — GLUCOSE, CSF: Glucose, CSF: 88 mg/dL — ABNORMAL HIGH (ref 40–70)

## 2016-07-13 MED ORDER — HEPARIN SODIUM (PORCINE) 5000 UNIT/ML IJ SOLN: 5000.0000 [IU] | Freq: Three times a day (TID) | INTRAMUSCULAR | Status: DC

## 2016-07-13 MED ORDER — INSULIN ASPART 100 UNIT/ML ~~LOC~~ SOLN: 0.0000 [IU] | Freq: Three times a day (TID) | SUBCUTANEOUS | Status: DC

## 2016-07-13 MED ORDER — ACETAMINOPHEN 325 MG PO TABS: 650.0000 mg | ORAL_TABLET | Freq: Four times a day (QID) | ORAL | Status: DC | PRN

## 2016-07-13 MED ORDER — SODIUM CHLORIDE 0.9 % IV BOLUS (SEPSIS)
1000.0000 mL | Freq: Once | INTRAVENOUS | Status: AC
  Administered 2016-07-13: 1000 mL via INTRAVENOUS

## 2016-07-13 MED ORDER — VANCOMYCIN HCL IN DEXTROSE 1-5 GM/200ML-% IV SOLN
1000.0000 mg | Freq: Once | INTRAVENOUS | Status: AC
  Administered 2016-07-13: 1000 mg via INTRAVENOUS
  Filled 2016-07-13: qty 200

## 2016-07-13 MED ORDER — ONDANSETRON HCL 4 MG PO TABS: 4.0000 mg | ORAL_TABLET | Freq: Four times a day (QID) | ORAL | Status: DC | PRN

## 2016-07-13 MED ORDER — DIPHENHYDRAMINE HCL 50 MG/ML IJ SOLN
25.0000 mg | Freq: Once | INTRAMUSCULAR | Status: AC
  Administered 2016-07-13: 25 mg via INTRAVENOUS
  Filled 2016-07-13: qty 1

## 2016-07-13 MED ORDER — CEFTRIAXONE SODIUM 1 G IJ SOLR
1.0000 g | Freq: Once | INTRAMUSCULAR | Status: AC
  Administered 2016-07-13: 1 g via INTRAVENOUS
  Filled 2016-07-13: qty 10

## 2016-07-13 MED ORDER — METOCLOPRAMIDE HCL 5 MG/ML IJ SOLN
10.0000 mg | Freq: Once | INTRAMUSCULAR | Status: AC
  Administered 2016-07-13: 10 mg via INTRAVENOUS
  Filled 2016-07-13: qty 2

## 2016-07-13 MED ORDER — ALBUTEROL SULFATE (2.5 MG/3ML) 0.083% IN NEBU: 2.5000 mg | INHALATION_SOLUTION | RESPIRATORY_TRACT | Status: DC | PRN

## 2016-07-13 MED ORDER — KETOROLAC TROMETHAMINE 30 MG/ML IJ SOLN
30.0000 mg | Freq: Once | INTRAMUSCULAR | Status: AC
  Administered 2016-07-13: 30 mg via INTRAVENOUS
  Filled 2016-07-13: qty 1

## 2016-07-13 MED ORDER — ACYCLOVIR SODIUM 50 MG/ML IV SOLN
INTRAVENOUS | Status: AC
  Administered 2016-07-13: 675 mg
  Filled 2016-07-13: qty 10

## 2016-07-13 MED ORDER — FERROUS GLUCONATE 324 (38 FE) MG PO TABS: 324.0000 mg | ORAL_TABLET | Freq: Every day | ORAL | Status: DC

## 2016-07-13 MED ORDER — SODIUM CHLORIDE 0.9 % IV SOLN
1000.0000 mL | Freq: Once | INTRAVENOUS | Status: AC
  Administered 2016-07-13: 1000 mL via INTRAVENOUS

## 2016-07-13 MED ORDER — SODIUM CHLORIDE 0.9 % IV SOLN: INTRAVENOUS | Status: AC

## 2016-07-13 MED ORDER — ONDANSETRON HCL 4 MG/2ML IJ SOLN: 4.0000 mg | Freq: Four times a day (QID) | INTRAMUSCULAR | Status: DC | PRN

## 2016-07-13 MED ORDER — DEXTROSE 5 % IV SOLN
10.0000 mg/kg | Freq: Three times a day (TID) | INTRAVENOUS | Status: DC
  Administered 2016-07-13 (×2): 675 mg via INTRAVENOUS
  Filled 2016-07-13 (×5): qty 13.5

## 2016-07-13 MED ORDER — ACETAMINOPHEN 650 MG RE SUPP: 650.0000 mg | Freq: Four times a day (QID) | RECTAL | Status: DC | PRN

## 2016-07-13 MED ORDER — INSULIN ASPART 100 UNIT/ML ~~LOC~~ SOLN: 0.0000 [IU] | Freq: Every day | SUBCUTANEOUS | Status: DC

## 2016-07-13 MED ORDER — SODIUM CHLORIDE 0.9 % IV SOLN
INTRAVENOUS | Status: DC
  Administered 2016-07-13: 18:00:00 via INTRAVENOUS

## 2016-07-13 MED ORDER — LIDOCAINE HCL 2 % IJ SOLN
10.0000 mL | Freq: Once | INTRAMUSCULAR | Status: AC
  Administered 2016-07-13: 20 mg
  Filled 2016-07-13: qty 20

## 2016-07-13 MED ORDER — SODIUM CHLORIDE 0.9 % IV SOLN
1000.0000 mL | INTRAVENOUS | Status: DC
  Administered 2016-07-13: 1000 mL via INTRAVENOUS

## 2016-07-13 MED ORDER — IBUPROFEN 200 MG PO TABS
400.0000 mg | ORAL_TABLET | Freq: Four times a day (QID) | ORAL | Status: DC | PRN
  Administered 2016-07-13 – 2016-07-14 (×2): 400 mg via ORAL
  Filled 2016-07-13 (×2): qty 2

## 2016-07-13 NOTE — ED Notes (Signed)
Pt reports soreness, HA with photosensitivity. Symptoms began after starting valtrex.

## 2016-07-13 NOTE — ED Notes (Signed)
ED Provider at bedside. Pt feeling different after medicated. Pt alert. Reports throat tightness and cotton mouth. Pt speaking in full clear sentences. HR change from 124-159. Now back to HR 110. vITATLS STABLE.

## 2016-07-13 NOTE — ED Notes (Signed)
ED Provider at bedside for LSP.  

## 2016-07-13 NOTE — ED Notes (Signed)
Critical high WBC of 49 in CSF [facility] [place] [facility].  Results given to Merry ProudBrandi, RN and Dr. Donnald GarrePfeiffer.

## 2016-07-13 NOTE — ED Provider Notes (Signed)
[facility] Provider Note   CSN: [id number] Arrival date & time: 07/13/16  1034     History   Chief Complaint Chief Complaint  Patient presents with  . Rash  . Fever    HPI Selena Holt is a 45 y.o. female.  HPI Patient developed a rash on her right buttock 4 days ago. She was diagnosed as shingles. Patient has been taking Valtrex for 2 days. She reports yesterday she started to get a fever and generalized body aches. She is also developed a severe headache. She reports her whole head is throbbing. It is extremely painful if she leans forward. She reports light sensitivity and pain in her neck with forward leaning. No weakness numbness or tingling of extremities. No dizziness or gait incoordination. Effusion. No problems with her vision except photophobia. Patient also had been diagnosed with sinus infection several weeks ago. She did take antibiotics. Patient has a history of meningitis in 2004. This was bacterial meningitis and was attributed to of extension of otitis media. Patient poor she had prolonged hospitalization. Otherwise, she reports she has baseline controlled diabetes on oral medications. She denies other medical problems. Past Medical History:  Diagnosis Date  . Anemia   . Bipolar disorder (HCC)   . Collapsed lung    "spontaneous" , teenager  . Diabetes mellitus without complication (HCC)   . HSV-1 (herpes simplex virus 1) infection   . Hypertension   . Kidney stones   . Scoliosis (and kyphoscoliosis), idiopathic     Patient Active Problem List   Diagnosis Date Noted  . Meningitis 07/13/2016  . Pelvic pain in female 07/19/2015  . Nausea with vomiting 07/19/2015  . Fungal infection of foot 07/19/2015  . Cognitive changes 06/20/2015  . CRP elevated 05/16/2015  . Confusion 05/04/2015  . Gait abnormality 05/04/2015  . Dysphasia 05/04/2015  . Memory loss of unknown cause 05/04/2015  . Weakness of both lower limbs 05/04/2015  . Neurological  complaint 05/04/2015  . Polyuria 04/13/2015  . Visit for preventive health examination 04/13/2015  . Left arm numbness 01/03/2015  . Anemia, iron deficiency 01/03/2015  . Chest discomfort 10/31/2014  . Proteinuria 03/09/2014  . Vitamin B 12 deficiency 03/09/2014  . Leukocytosis 03/09/2014  . Other and unspecified hyperlipidemia 02/10/2014  . Unspecified vitamin D deficiency 02/10/2014  . Elevated liver enzymes 10/17/2013  . Diabetes type 2, controlled 11/11/2012    Past Surgical History:  Procedure Laterality Date  . CHEST TUBE INSERTION      OB History    No data available       Home Medications    Prior to Admission medications   Medication Sig Start Date End Date Taking? Authorizing Provider  ferrous gluconate (FERGON) 324 MG tablet Take 1 tablet (324 mg total) by mouth daily. 01/28/16  Yes Renee A Kuneff, DO  glucose blood test strip Use as instructed to check blood sugar twice daily 10/18/15  Yes Renee A Kuneff, DO  levonorgestrel (MIRENA) 20 MCG/24HR IUD 1 each by Intrauterine route once.   Yes Historical Provider, MD  metFORMIN (GLUCOPHAGE) 500 MG tablet Take 1 tablet (500 mg total) by mouth daily with breakfast. 10/22/15 07/13/16 Yes Renee A Kuneff, DO  Cholecalciferol (D3 MAXIMUM STRENGTH) 5000 UNIT/ML LIQD Take 1 mL (5,000 Units total) by mouth daily with supper. Patient not taking: Reported on 06/20/2015 09/28/14   Kelle DartingLayne C Weaver, NP  ciprofloxacin (CIPRO) 500 MG tablet Take 1 tablet (500 mg total) by mouth 2 (two) times  daily. 07/19/15   Renee A Kuneff, DO  clotrimazole (LOTRIMIN) 1 % cream Apply 1 application topically 2 (two) times daily. 07/19/15   Renee A Kuneff, DO  Cyanocobalamin (VITAMIN B-12 PO) Take 1,200 mcg by mouth daily.    Historical Provider, MD  naproxen (NAPROSYN) 500 MG tablet Take 1 tablet (500 mg total) by mouth 2 (two) times daily with a meal. 07/19/15   Renee A Kuneff, DO  ondansetron (ZOFRAN) 4 MG tablet Take 1 tablet (4 mg total) by mouth every 8  (eight) hours as needed for nausea or vomiting. 07/19/15   Natalia Leatherwoodenee A Kuneff, DO    Family History Family History  Problem Relation Age of Onset  . Hyperlipidemia Mother   . Hypertension Mother   . Hyperlipidemia Father   . Hypertension Father   . CAD Father   . Heart disease Father   . Alzheimer's disease Maternal Grandmother 6150    Social History Social History  Substance Use Topics  . Smoking status: Never Smoker  . Smokeless tobacco: Never Used  . Alcohol use No     Comment: occ     Allergies   Tetanus toxoids; Ceclor [cefaclor]; Penicillins; and Sulfa antibiotics   Review of Systems Review of Systems 10 Systems reviewed and are negative for acute change except as noted in the HPI.   Physical Exam Updated Vital Signs BP 155/89 (BP Location: Left Arm)   Pulse 108   Temp 98.8 F (37.1 C) (Oral)   Resp 17   Ht 5\' 4"  (1.626 m)   Wt 149 lb (67.6 kg)   SpO2 100%   BMI 25.58 kg/m   Physical Exam  Constitutional: She is oriented to person, place, and time. She appears well-developed and well-nourished. No distress.  Patient is wearing dark glasses. She is however alert with clear mental status. Speech is normal and clear. She is nontoxic. No respiratory distress.  HENT:  Head: Normocephalic and atraumatic.  Bilateral normal TMs. No erythema no bulging no lesions in auditory canal. Meters memories are pink and moist. Posterior oropharynx patent. No oral lesions.  Eyes: Conjunctivae and EOM are normal. Pupils are equal, round, and reactive to light.  Neck:  No cervical lymphadenopathy but pain with forward flexion consistent with meningismus.  Cardiovascular: Normal rate and regular rhythm.   No murmur heard. Pulmonary/Chest: Effort normal and breath sounds normal. No respiratory distress.  Abdominal: Soft. She exhibits no distension. There is no tenderness.  Musculoskeletal: She exhibits no edema, tenderness or deformity.  Neurological: She is alert and oriented to  person, place, and time. No cranial nerve deficit or sensory deficit. She exhibits normal muscle tone. Coordination normal.  Skin: Skin is warm and dry. Rash noted.  Right buttock at right ishium has a patch of rash approximately 5 cm. These are small eschar is approximately 3 mm each, about 12-15. No fluid-filled vesicles or pustules at this time no associated cellulitis.  Psychiatric: She has a normal mood and affect.  Nursing note and vitals reviewed.    ED Treatments / Results  Labs (all labs ordered are listed, but only abnormal results are displayed) Labs Reviewed  COMPREHENSIVE METABOLIC PANEL - Abnormal; Notable for the following:       Result Value   Glucose, Bld 128 (*)    ALT 70 (*)    All other components within normal limits  CBC WITH DIFFERENTIAL/PLATELET - Abnormal; Notable for the following:    Neutro Abs 7.9 (*)    All other  components within normal limits  URINALYSIS, ROUTINE W REFLEX MICROSCOPIC - Abnormal; Notable for the following:    Leukocytes, UA TRACE (*)    All other components within normal limits  CSF CELL COUNT WITH DIFFERENTIAL - Abnormal; Notable for the following:    RBC Count, CSF 81 (*)    WBC, CSF 49 (*)    Lymphs, CSF 83 (*)    Monocyte-Macrophage-Spinal Fluid 4 (*)    All other components within normal limits  GLUCOSE, CSF - Abnormal; Notable for the following:    Glucose, CSF 88 (*)    All other components within normal limits  PROTEIN, CSF - Abnormal; Notable for the following:    Total  Protein, CSF 63 (*)    All other components within normal limits  URINALYSIS, MICROSCOPIC (REFLEX) - Abnormal; Notable for the following:    Bacteria, UA RARE (*)    Squamous Epithelial / LPF 0-5 (*)    All other components within normal limits  CSF CULTURE  CULTURE, BLOOD (ROUTINE X 2)  CULTURE, BLOOD (ROUTINE X 2)  GRAM STAIN  HSV CULTURE AND TYPING  PREGNANCY, URINE  INFLUENZA PANEL BY PCR (TYPE A & B, H1N1)  CSF CELL COUNT WITH DIFFERENTIAL    HERPES SIMPLEX VIRUS(HSV) DNA BY PCR  I-STAT CG4 LACTIC ACID, ED    EKG  EKG Interpretation None       Radiology Ct Head Wo Contrast  Result Date: 07/13/2016 CLINICAL DATA:  Headache for 2 days, no history of head injury or stroke, past history diabetes mellitus, hypertension EXAM: CT HEAD WITHOUT CONTRAST TECHNIQUE: Contiguous axial images were obtained from the base of the skull through the vertex without intravenous contrast. COMPARISON:  None; correlation MR brain 05/12/2015 FINDINGS: Brain: Normal ventricular morphology. No midline shift or mass effect. Normal appearance of brain parenchyma. No intracranial hemorrhage, mass lesion or evidence of acute infarction. No extra-axial fluid collections. Vascular: Unremarkable Skull: Intact Sinuses/Orbits: Fluid/ mucous and question mucosal retention cyst RIGHT maxillary sinus. Remaining visualized paranasal sinuses and mastoid air cells clear. Other: N/A IMPRESSION: No acute intracranial abnormalities. RIGHT maxillary sinus disease. Electronically Signed   By: Ulyses Southward M.D.   On: 07/13/2016 11:53    Procedures .Lumbar Puncture Date/Time: 07/13/2016 12:41 PM Performed by: Arby Barrette Authorized by: Arby Barrette   Consent:    Consent obtained:  Written   Consent given by:  Patient   Risks discussed:  Bleeding, infection, pain, repeat procedure, nerve damage and headache Pre-procedure details:    Procedure purpose:  Diagnostic   Preparation: Patient was prepped and draped in usual sterile fashion   Anesthesia (see MAR for exact dosages):    Anesthesia method:  Local infiltration   Local anesthetic:  Lidocaine 2% w/o epi Procedure details:    Lumbar space:  L3-L4 interspace   Patient position:  Sitting   Needle gauge:  20   Needle type:  Spinal needle - Quincke tip   Needle length (in):  3.5   Ultrasound guidance: no     Number of attempts:  1   Fluid appearance:  Clear   Tubes of fluid:  4   Total volume (ml):   6 Post-procedure:    Puncture site:  Adhesive bandage applied   Patient tolerance of procedure:  Tolerated well, no immediate complications   (including critical care time) CRITICAL CARE Performed by: Arby Barrette   Total critical care time: 30 minutes  Critical care time was exclusive of separately billable procedures and treating  other patients.  Critical care was necessary to treat or prevent imminent or life-threatening deterioration.  Critical care was time spent personally by me on the following activities: development of treatment plan with patient and/or surrogate as well as nursing, discussions with consultants, evaluation of patient's response to treatment, examination of patient, obtaining history from patient or surrogate, ordering and performing treatments and interventions, ordering and review of laboratory studies, ordering and review of radiographic studies, pulse oximetry and re-evaluation of patient's condition. Medications Ordered in ED Medications  acyclovir (ZOVIRAX) 675 mg in dextrose 5 % 100 mL IVPB (0 mg/kg  67.6 kg Intravenous Stopped 07/13/16 1342)  0.9 %  sodium chloride infusion (0 mLs Intravenous Stopped 07/13/16 1445)    Followed by  0.9 %  sodium chloride infusion (1,000 mLs Intravenous Transfusing/Transfer 07/13/16 1520)  sodium chloride 0.9 % bolus 1,000 mL (0 mLs Intravenous Stopped 07/13/16 1255)  lidocaine (XYLOCAINE) 2 % (with pres) injection 200 mg (20 mg Infiltration Given 07/13/16 1210)  acyclovir (ZOVIRAX) 50 MG/ML injection (675 mg  Given 07/13/16 1242)  cefTRIAXone (ROCEPHIN) 1 g in dextrose 5 % 50 mL IVPB (0 g Intravenous Stopped 07/13/16 1426)  vancomycin (VANCOCIN) IVPB 1000 mg/200 mL premix (0 mg Intravenous Stopped 07/13/16 1503)  ketorolac (TORADOL) 30 MG/ML injection 30 mg (30 mg Intravenous Given 07/13/16 1335)  metoCLOPramide (REGLAN) injection 10 mg (10 mg Intravenous Given 07/13/16 1335)  diphenhydrAMINE (BENADRYL) injection 25  mg (25 mg Intravenous Given 07/13/16 1335)     Initial Impression / Assessment and Plan / ED Course  I have reviewed the triage vital signs and the nursing notes.  Pertinent labs & imaging results that were available during my care of the patient were reviewed by me and considered in my medical decision making (see chart for details).  Clinical Course    I did reassess patient per nurse's request after administration of Toradol, Reglan and Benadryl. Nurse reported right after pushing the Reglan, patient had sensation of feeling like she had cotton balls in her mouth, her throat felt sticky and an odd taste. She became tachycardic. I assessed the patient she did not have any rash, no visible oral swelling and no hypotension. Symptoms gradually resolved and she did feel improved with improvement in her headache and did not develop any further signs of allergic reaction. Tachycardia resolved spontaneously.  Consultation: [facility] hospitalist for admission Dr. Melynda Ripple  Final Clinical Impressions(s) / ED Diagnoses   Final diagnoses:  Meningitis  Herpes zoster with meningitis   Patient presents with symptoms as outlined above. Concern is for viral meningitis with recent zoster outbreak. Patient does have normal mental status. She is not showing signs of encephalopathy. She does have severe headache and photophobia. Headache improved with treatment with fluids, Toradol, Reglan and Benadryl. Also, due to the patient's history of bacterial meningitis from extension of otitis media and her current finding of maxillary sinusitis on CT scan and she has been covered with antibiotics for possible bacterial etiology although this does seem of lower probability based on the patient's clinical appearance. New Prescriptions New Prescriptions   No medications on file     Arby Barrette, MD 07/13/16 1606

## 2016-07-13 NOTE — ED Triage Notes (Signed)
Rash on her right buttocks x 4 days. [facility] and started on Valtrex x 2 days. Here today with fever and generalized bodyaches.

## 2016-07-13 NOTE — ED Notes (Signed)
Consent obtained and signed.  

## 2016-07-13 NOTE — H&P (Signed)
[facility] H&P   Patient Demographics:    Selena Holt, is a 45 y.o. female  MRN: [id number]   DOB - 1971-05-19  Admit Date - 07/13/2016  Outpatient Primary MD for the patient is Felix Pacinienee Kuneff, DO  [facility]  Patient coming from: Home  Chief Complaint  Patient presents with  . Rash  . Fever      HPI:    Selena Holt  is a 45 y.o. female, With past medical history of hypertension, diabetes mellitus, depression, history of bacterial meningitis in 2004, presents with complaints of fever, headache and photophobia, patient reports she developed a rash and right buttocks area 4 days ago, started Valtrex yesterday, reports rash with itching, initially painful, but currently improved, reports feeling febrile at home, having generalized body ache, headache, throbbing, as well reports light activity, she denies any focal deficits, tingling, numbness. - in ED agent had LP performed significant for white blood cell of 49, predominantly lymphocytosis, with elevated glucose and protein level, she was afebrile, with no leukocytosis, started on IV acyclovir, hospitalist requested to admit ,    Review of systems:    In addition to the HPI above,  Reports fever at home Complaints of headache and photophobia, denies any neck pain No problems swallowing food or Liquids, No Chest pain, Cough or Shortness of Breath, No Abdominal pain, No Nausea or Vommitting, Bowel movements are regular, No Blood in stool or Urine, No dysuria, Report single rash in right buttocks area No new joints pains-aches,  No new weakness, tingling, numbness in any extremity, No recent weight gain or loss, No polyuria, polydypsia or polyphagia, No significant Mental Stressors.  A full 10 point Review of Systems was done, except as stated above, all other Review of Systems were negative.   With Past History  of the following :    Past Medical History:  Diagnosis Date  . Anemia   . Bipolar disorder (HCC)   . Collapsed lung    "spontaneous" , teenager  . Diabetes mellitus without complication (HCC)   . HSV-1 (herpes simplex virus 1) infection   . Hypertension   . Kidney stones   . Scoliosis (and kyphoscoliosis), idiopathic       Past Surgical History:  Procedure Laterality Date  . CHEST TUBE INSERTION        Social History:     Social History  Substance Use Topics  . Smoking status: Never Smoker  . Smokeless tobacco: Never Used  . Alcohol use No     Comment: occ     Lives - at home  Mobility - independent     Family History :     Family History  Problem Relation Age of Onset  . Hyperlipidemia Mother   . Hypertension Mother   . Hyperlipidemia Father   . Hypertension Father   . CAD Father   . Heart disease  Father   . Alzheimer's disease Maternal Grandmother 50      Home Medications:   Prior to Admission medications   Medication Sig Start Date End Date Taking? Authorizing Provider  ferrous gluconate (FERGON) 324 MG tablet Take 1 tablet (324 mg total) by mouth daily. 01/28/16  Yes Renee A Kuneff, DO  glucose blood test strip Use as instructed to check blood sugar twice daily 10/18/15  Yes Renee A Kuneff, DO  levonorgestrel (MIRENA) 20 MCG/24HR IUD 1 each by Intrauterine route once.   Yes Historical Provider, MD  metFORMIN (GLUCOPHAGE) 500 MG tablet Take 1 tablet (500 mg total) by mouth daily with breakfast. 10/22/15 07/13/16 Yes Renee A Kuneff, DO  Cholecalciferol (D3 MAXIMUM STRENGTH) 5000 UNIT/ML LIQD Take 1 mL (5,000 Units total) by mouth daily with supper. Patient not taking: Reported on 06/20/2015 09/28/14   Kelle DartingLayne C Weaver, NP  ciprofloxacin (CIPRO) 500 MG tablet Take 1 tablet (500 mg total) by mouth 2 (two) times daily. 07/19/15   Renee A Kuneff, DO  clotrimazole (LOTRIMIN) 1 % cream Apply 1 application topically 2 (two) times daily. 07/19/15   Renee A Kuneff,  DO  Cyanocobalamin (VITAMIN B-12 PO) Take 1,200 mcg by mouth daily.    Historical Provider, MD  naproxen (NAPROSYN) 500 MG tablet Take 1 tablet (500 mg total) by mouth 2 (two) times daily with a meal. 07/19/15   Renee A Kuneff, DO  ondansetron (ZOFRAN) 4 MG tablet Take 1 tablet (4 mg total) by mouth every 8 (eight) hours as needed for nausea or vomiting. 07/19/15   Renee A Kuneff, DO     Allergies:     Allergies  Allergen Reactions  . Tetanus Toxoids Anaphylaxis    tetanus  . Ceclor [Cefaclor] Hives  . Penicillins Hives  . Sulfa Antibiotics Hives     Physical Exam:   Vitals  Blood pressure (!) 141/84, pulse (!) 109, temperature 98.5 F (36.9 C), temperature source Oral, resp. rate 18, height 5\' 4"  (1.626 m), weight 67.6 kg (149 lb), SpO2 100 %.   1. General well developed female lying in bed in NAD,   2. Normal affect and insight, Not Suicidal or Homicidal, Awake Alert, Oriented X 3.  3. No F.N deficits, ALL C.Nerves Intact, Strength 5/5 all 4 extremities, Sensation intact all 4 extremities, Plantars down going.  4. Ears and Eyes appear Normal, Conjunctivae clear, PERRLA. Moist Oral Mucosa.  5. Supple Neck, No JVD, No cervical lymphadenopathy appriciated, No Carotid Bruits.  6. Symmetrical Chest wall movement, Good air movement bilaterally, CTAB.  7. RRR, No Gallops, Rubs or Murmurs, No Parasternal Heave.  8. Positive Bowel Sounds, Abdomen Soft, No tenderness, No organomegaly appriciated,No rebound -guarding or rigidity.  9.  No Cyanosis, Normal Skin Turgor, has healing dried crusting shingles rash in left buttock area.(exam done with female [facility] RN Korie)  10. Good muscle tone,  joints appear normal , no effusions, Normal ROM.  11. No Palpable Lymph Nodes in Neck or Axillae     Data Review:    CBC  Recent Labs Lab 07/13/16 1200  WBC 10.1  HGB 13.0  HCT 37.1  PLT 239  MCV 91.8  MCH 32.2  MCHC 35.0  RDW 13.4  LYMPHSABS 1.6  MONOABS 0.5  EOSABS 0.1    BASOSABS 0.0   ------------------------------------------------------------------------------------------------------------------  Chemistries   Recent Labs Lab 07/13/16 1200  NA 136  K 3.7  CL 105  CO2 23  GLUCOSE 128*  BUN 9  CREATININE 0.65  CALCIUM 9.5  AST 38  ALT 70*  ALKPHOS 86  BILITOT 0.6   ------------------------------------------------------------------------------------------------------------------ estimated creatinine clearance is 84.9 mL/min (by C-G formula based on SCr of 0.65 mg/dL). ------------------------------------------------------------------------------------------------------------------ No results for input(s): TSH, T4TOTAL, T3FREE, THYROIDAB in the last 72 hours.  Invalid input(s): FREET3  Coagulation profile No results for input(s): INR, PROTIME in the last 168 hours. ------------------------------------------------------------------------------------------------------------------- No results for input(s): DDIMER in the last 72 hours. -------------------------------------------------------------------------------------------------------------------  Cardiac Enzymes No results for input(s): CKMB, TROPONINI, MYOGLOBIN in the last 168 hours.  Invalid input(s): CK ------------------------------------------------------------------------------------------------------------------    Component Value Date/Time   BNP 11.9 12/01/2014 2334     ---------------------------------------------------------------------------------------------------------------  Urinalysis    Component Value Date/Time   COLORURINE YELLOW 07/13/2016 1525   APPEARANCEUR CLEAR 07/13/2016 1525   LABSPEC 1.005 07/13/2016 1525   PHURINE 6.5 07/13/2016 1525   GLUCOSEU NEGATIVE 07/13/2016 1525   GLUCOSEU NEGATIVE 02/10/2014 0855   HGBUR NEGATIVE 07/13/2016 1525   BILIRUBINUR NEGATIVE 07/13/2016 1525   BILIRUBINUR negative 07/19/2015 1126   KETONESUR NEGATIVE 07/13/2016  1525   PROTEINUR NEGATIVE 07/13/2016 1525   UROBILINOGEN 1.0 07/19/2015 1126   UROBILINOGEN 0.2 12/02/2014 0150   NITRITE NEGATIVE 07/13/2016 1525   LEUKOCYTESUR TRACE (A) 07/13/2016 1525    ----------------------------------------------------------------------------------------------------------------   Imaging Results:    Ct Head Wo Contrast  Result Date: 07/13/2016 CLINICAL DATA:  Headache for 2 days, no history of head injury or stroke, past history diabetes mellitus, hypertension EXAM: CT HEAD WITHOUT CONTRAST TECHNIQUE: Contiguous axial images were obtained from the base of the skull through the vertex without intravenous contrast. COMPARISON:  None; correlation MR brain 05/12/2015 FINDINGS: Brain: Normal ventricular morphology. No midline shift or mass effect. Normal appearance of brain parenchyma. No intracranial hemorrhage, mass lesion or evidence of acute infarction. No extra-axial fluid collections. Vascular: Unremarkable Skull: Intact Sinuses/Orbits: Fluid/ mucous and question mucosal retention cyst RIGHT maxillary sinus. Remaining visualized paranasal sinuses and mastoid air cells clear. Other: N/A IMPRESSION: No acute intracranial abnormalities. RIGHT maxillary sinus disease. Electronically Signed   By: Ulyses Southward M.D.   On: 07/13/2016 11:53     Assessment & Plan:    Active Problems:   Diabetes type 2, controlled   Anemia, iron deficiency   Meningitis   Shingles  Meningitis - Is most likely aseptic meningitis, most likely secondary to HSV, nontoxic-appearing, unlikely bacterial meningitis , stain with no organisms , no indications for IV antibiotics , continue with IV acyclovir pending HSV PCR , will check cryptococcus antigen , check HIV , case discussed with ID Dr. Algis Liming .  Anemia of iron deficiency - Continue with iron supplements  Shingles - Continue with the acyclovir  Diabetes mellitus - Continue to hold metformin, continue with insulin sliding  scale    DVT Prophylaxis Heparin -   SCDs  AM Labs Ordered, also please review Full Orders  Family Communication: Admission, patients condition and plan of care including tests being ordered have been discussed with the patient  who indicate understanding and agree with the plan and Code Status.  Code Status Full  Likely DC to Home  Condition GUARDED    Consults called: D/W ID via phone  Admission status: inpatient  Time spent in minutes : 55 minutes   Malosi Hemstreet M.D on 07/13/2016 at 5:56 PM  Between 7am to 7pm - Pager - [phone]. After 7pm go to [email or web address] - password [facility]  [facility] - Office  [phone]

## 2016-07-13 NOTE — Progress Notes (Signed)
45 year old female past medical history kidney stones, hypertension, herpes, collapsed lung, bipolar, diabetes, anemia and past episode of meningitis [facility] with complaint of headache and photophobia. Diagnosed with meningitis in the ER. Patient of note was recently diagnosed with shingles. High suspicion for herpetic meningitis. Patient hemodynamically stable, to a medical surgical bed. Emergency room provider has ordered acyclovir, Rocephin and vancomycin.  Haydee SalterPhillip M Hobbs MD

## 2016-07-14 DIAGNOSIS — G030 Nonpyogenic meningitis: Secondary | ICD-10-CM | POA: Diagnosis not present

## 2016-07-14 DIAGNOSIS — E119 Type 2 diabetes mellitus without complications: Secondary | ICD-10-CM | POA: Diagnosis not present

## 2016-07-14 DIAGNOSIS — B021 Zoster meningitis: Secondary | ICD-10-CM

## 2016-07-14 DIAGNOSIS — D509 Iron deficiency anemia, unspecified: Secondary | ICD-10-CM | POA: Diagnosis not present

## 2016-07-14 DIAGNOSIS — B029 Zoster without complications: Secondary | ICD-10-CM

## 2016-07-14 LAB — CBC
HCT: 30.5 % — ABNORMAL LOW (ref 36.0–46.0)
Hemoglobin: 10.5 g/dL — ABNORMAL LOW (ref 12.0–15.0)
MCH: 31.4 pg (ref 26.0–34.0)
MCHC: 34.4 g/dL (ref 30.0–36.0)
MCV: 91.3 fL (ref 78.0–100.0)
Platelets: 175 10*3/uL (ref 150–400)
RBC: 3.34 MIL/uL — ABNORMAL LOW (ref 3.87–5.11)
RDW: 13.5 % (ref 11.5–15.5)
WBC: 7.6 10*3/uL (ref 4.0–10.5)

## 2016-07-14 LAB — BASIC METABOLIC PANEL
Anion gap: 4 — ABNORMAL LOW (ref 5–15)
BUN: <5 mg/dL — ABNORMAL LOW (ref 6–20)
CO2: 25 mmol/L (ref 22–32)
Calcium: 8.4 mg/dL — ABNORMAL LOW (ref 8.9–10.3)
Chloride: 111 mmol/L (ref 101–111)
Creatinine, Ser: 0.62 mg/dL (ref 0.44–1.00)
GFR calc Af Amer: >60 mL/min (ref >60)
GFR calc non Af Amer: >60 mL/min (ref >60)
Glucose, Bld: 131 mg/dL — ABNORMAL HIGH (ref 65–99)
Potassium: 3.7 mmol/L (ref 3.5–5.1)
Sodium: 140 mmol/L (ref 135–145)

## 2016-07-14 LAB — GLUCOSE, CAPILLARY
Glucose-Capillary: 129 mg/dL — ABNORMAL HIGH (ref 65–99)
Glucose-Capillary: 176 mg/dL — ABNORMAL HIGH (ref 65–99)

## 2016-07-14 LAB — HIV ANTIBODY (ROUTINE TESTING W REFLEX): HIV Screen 4th Generation wRfx: NONREACTIVE

## 2016-07-14 MED ORDER — VALACYCLOVIR HCL 500 MG PO TABS: 1000.0000 mg | ORAL_TABLET | Freq: Three times a day (TID) | ORAL | Status: DC

## 2016-07-14 MED ORDER — NAPROXEN 500 MG PO TABS: 500.0000 mg | ORAL_TABLET | Freq: Two times a day (BID) | ORAL | 0 refills | Status: DC | PRN

## 2016-07-14 MED ORDER — VALACYCLOVIR HCL 1 G PO TABS: 1000.0000 mg | ORAL_TABLET | Freq: Three times a day (TID) | ORAL | 0 refills | Status: DC

## 2016-07-14 MED ORDER — VALACYCLOVIR HCL 500 MG PO TABS
1000.0000 mg | ORAL_TABLET | Freq: Three times a day (TID) | ORAL | Status: DC
  Administered 2016-07-14: 1000 mg via ORAL
  Filled 2016-07-14: qty 2

## 2016-07-14 NOTE — Discharge Instructions (Signed)

## 2016-07-14 NOTE — Progress Notes (Signed)
Pt refused to take her acyclovir this morning because she rather stay away from iv medication and take PO instead. She requests to speak to the doctor. Pt was educated and day shift nurse made aware.

## 2016-07-14 NOTE — Discharge Summary (Signed)
Physician Discharge Summary  Selena CoffeeZondra K Holt ZOX:[id number] DOB: [birth year] DOA: 07/13/2016  PCP: Felix Pacinienee Kuneff, DO  Admit date: 07/13/2016 Discharge date: 07/14/2016  Admitted From: home  Disposition:  home   Recommendations for Outpatient Follow-up:  1. F/u on Varicella zoster and HSV PCR  Discharge Condition:  stable   CODE STATUS:  Full code   Diet recommendation:  Diabetic diet Consultations:  Phone consult with ID    Discharge Diagnoses:  Principal Problem:   Aseptic meningitis Active Problems:   Diabetes type 2, controlled   Anemia, iron deficiency   Shingles rash    Subjective: Mild headache - photophobia and neck pain resolved.   HPI: Selena Holt  is a 45 y.o. female, With past medical history of hypertension, diabetes mellitus, depression, history of bacterial meningitis in 2004, presents with complaints of fever, headache and photophobia, patient reports she developed a rash in right buttocks area 4 days ago, diagnosed with Shingles & started Valtrex yesterday. Reports feeling febrile at home, having generalized body aches, headache, neck pain and photophobia.  In ED agent had LP performed which was consistent with aseptic meningitis  Hospital Course:  Aseptic meningitis- -  CSF fluid analysis below under "labs" heading -With concomitant Varicella zoster skin infection right buttock who has been started on Valtrex as outpt - Symptoms have improved today and she remains with only a mild headache which is controlled with NSAIDs -Started on acyclovir on admission- as today's Christmas, patient is anxious to go home -upon discussion with ID, Dr. Ninetta LightsHatcher, as her symptoms are improving, we can switch to Valtrex 1 g 3 times a day for a total of 2 weeks to cover for varicella-zoster meningitis -although less likely, HSV PCR is  pending and would be covered with this treatment as well -Other causes would include enterovirus which does not need treatment -Have  recommended she follow-up with her PCP closely later this week to ensure that her symptoms continued to resolve and she does not develop a bacterial superinfection  Varicella-zoster rash -See above-no vesicles on exam today- rash is encrusted   -she has some mild tingling and burning in the area  Diabetes mellitus - Continue metformin  Anemia with iron deficiency -[facility]   Discharge Instructions  Discharge Instructions    Diet Carb Modified    Complete by:  As directed    Increase activity slowly    Complete by:  As directed      Allergies as of 07/14/2016      Reactions   Tetanus Toxoids Anaphylaxis   tetanus   Ceclor [cefaclor] Hives   Penicillins Hives   Sulfa Antibiotics Hives      Medication List    STOP taking these medications   ciprofloxacin 500 MG tablet Commonly known as:  CIPRO     TAKE these medications   Cholecalciferol 5000 UNIT/ML Liqd Commonly known as:  D3 MAXIMUM STRENGTH Take 1 mL (5,000 Units total) by mouth daily with supper.   clotrimazole 1 % cream Commonly known as:  LOTRIMIN Apply 1 application topically 2 (two) times daily.   ferrous gluconate 324 MG tablet Commonly known as:  FERGON Take 1 tablet (324 mg total) by mouth daily.   glucose blood test strip Use as instructed to check blood sugar twice daily   levonorgestrel 20 MCG/24HR IUD Commonly known as:  MIRENA 1 each by Intrauterine route once.   metFORMIN 500 MG tablet Commonly known as:  GLUCOPHAGE Take 1 tablet (500 mg total) by  mouth daily with breakfast.   naproxen 500 MG tablet Commonly known as:  NAPROSYN Take 1 tablet (500 mg total) by mouth 2 (two) times daily as needed for mild pain or headache. What changed:  when to take this  reasons to take this   ondansetron 4 MG tablet Commonly known as:  ZOFRAN Take 1 tablet (4 mg total) by mouth every 8 (eight) hours as needed for nausea or vomiting.   valACYclovir 1000 MG tablet Commonly known as:   VALTREX Take 1 tablet (1,000 mg total) by mouth 3 (three) times daily.   VITAMIN B-12 PO Take 1,200 mcg by mouth daily.      Follow-up Information    Felix Pacini, DO Follow up in 4 day(s).   Specialty:  Family Medicine Why:  You must follow-up with your family physician later this week to ensure that your symptoms are resolving and to follow-up on lab work Contact information: [address] [place] [address] [phone]          Allergies  Allergen Reactions  . Tetanus Toxoids Anaphylaxis    tetanus  . Ceclor [Cefaclor] Hives  . Penicillins Hives  . Sulfa Antibiotics Hives     Procedures/Studies:   Ct Head Wo Contrast  Result Date: 07/13/2016 CLINICAL DATA:  Headache for 2 days, no history of head injury or stroke, past history diabetes mellitus, hypertension EXAM: CT HEAD WITHOUT CONTRAST TECHNIQUE: Contiguous axial images were obtained from the base of the skull through the vertex without intravenous contrast. COMPARISON:  None; correlation MR brain 05/12/2015 FINDINGS: Brain: Normal ventricular morphology. No midline shift or mass effect. Normal appearance of brain parenchyma. No intracranial hemorrhage, mass lesion or evidence of acute infarction. No extra-axial fluid collections. Vascular: Unremarkable Skull: Intact Sinuses/Orbits: Fluid/ mucous and question mucosal retention cyst RIGHT maxillary sinus. Remaining visualized paranasal sinuses and mastoid air cells clear. Other: N/A IMPRESSION: No acute intracranial abnormalities. RIGHT maxillary sinus disease. Electronically Signed   By: Ulyses Southward M.D.   On: 07/13/2016 11:53       Discharge Exam: Vitals:   07/13/16 2028 07/14/16 0542  BP: (!) 149/84 (!) 145/76  Pulse: 99 91  Resp: 18 18  Temp: 98.7 F (37.1 C) 98.2 F (36.8 C)   Vitals:   07/13/16 1521 07/13/16 1744 07/13/16 2028 07/14/16 0542  BP: 155/89 (!) 141/84 (!) 149/84 (!) 145/76  Pulse: 108 (!) 109 99 91  Resp: 17 18 18 18   Temp:  98.5 F  (36.9 C) 98.7 F (37.1 C) 98.2 F (36.8 C)  TempSrc:  Oral Oral Oral  SpO2: 100% 100% 100% 100%  Weight:      Height:        General: Pt is alert, awake, not in acute distress Cardiovascular: RRR, S1/S2 +, no rubs, no gallops Respiratory: CTA bilaterally, no wheezing, no rhonchi Abdominal: Soft, NT, ND, bowel sounds + Extremities: no edema, no cyanosis    The results of significant diagnostics from this hospitalization (including imaging, microbiology, ancillary and laboratory) are listed below for reference.     Microbiology: Recent Results (from the past 240 hour(s))  CSF culture     Status: None (Preliminary result)   Collection Time: 07/13/16 12:43 PM  Result Value Ref Range Status   Specimen Description CSF  Final   Special Requests NONE  Final   Gram Stain   Final    WBC PRESENT, PREDOMINANTLY MONONUCLEAR NO ORGANISMS SEEN CYTOSPIN SMEAR    Culture   Final  NO GROWTH < 24 HOURS Performed at [facility]    Report Status PENDING  Incomplete     Labs: BNP (last 3 results) No results for input(s): BNP in the last 8760 hours.   Glucose, CSF    88        Glucose, CSF     Total Protein, CSF    63        Total Protein, CSF    RBC Count, CSF    81 1        RBC Count, CSF    WBC, CSF    49 57        WBC, CSF    Segmented Neutrophils-CSF    4        Segmented Neutrophils-CSF    Lymphs, CSF    83 84        Lymphs, CSF    Monocyte-Macrophage-Spinal Fluid    4 16        Monocyte-Macrophage-Spinal Fluid    Appearance, CSF    CLEAR CLEAR        Appearance, CSF    Color, CSF    COLORL... COLORL...        Color, CSF    Supernatant    NOT IN... NOT IN...        Supernatant    Tube #    1 4        Tube #    MICROBIOLOGY    Basic Metabolic Panel:  Recent Labs Lab 07/13/16 1200 07/13/16 1809 07/14/16 0525  NA 136  --  140  K 3.7  --  3.7  CL 105  --  111  CO2 23  --  25  GLUCOSE 128*  --  131*  BUN 9  --  <5*  CREATININE 0.65 0.62 0.62    CALCIUM 9.5  --  8.4*   Liver Function Tests:  Recent Labs Lab 07/13/16 1200  AST 38  ALT 70*  ALKPHOS 86  BILITOT 0.6  PROT 8.0  ALBUMIN 4.8   No results for input(s): LIPASE, AMYLASE in the last 168 hours. No results for input(s): AMMONIA in the last 168 hours. CBC:  Recent Labs Lab 07/13/16 1200 07/14/16 0525  WBC 10.1 7.6  NEUTROABS 7.9*  --   HGB 13.0 10.5*  HCT 37.1 30.5*  MCV 91.8 91.3  PLT 239 175   Cardiac Enzymes: No results for input(s): CKTOTAL, CKMB, CKMBINDEX, TROPONINI in the last 168 hours. BNP: Invalid input(s): POCBNP CBG:  Recent Labs Lab 07/13/16 1751 07/13/16 2026 07/14/16 0540  GLUCAP 160* 153* 129*   D-Dimer No results for input(s): DDIMER in the last 72 hours. Hgb A1c No results for input(s): HGBA1C in the last 72 hours. Lipid Profile No results for input(s): CHOL, HDL, LDLCALC, TRIG, CHOLHDL, LDLDIRECT in the last 72 hours. Thyroid function studies No results for input(s): TSH, T4TOTAL, T3FREE, THYROIDAB in the last 72 hours.  Invalid input(s): FREET3 Anemia work up No results for input(s): VITAMINB12, FOLATE, FERRITIN, TIBC, IRON, RETICCTPCT in the last 72 hours. Urinalysis    Component Value Date/Time   COLORURINE YELLOW 07/13/2016 1525   APPEARANCEUR CLEAR 07/13/2016 1525   LABSPEC 1.005 07/13/2016 1525   PHURINE 6.5 07/13/2016 1525   GLUCOSEU NEGATIVE 07/13/2016 1525   GLUCOSEU NEGATIVE 02/10/2014 0855   HGBUR NEGATIVE 07/13/2016 1525   BILIRUBINUR NEGATIVE 07/13/2016 1525   BILIRUBINUR negative 07/19/2015 1126   KETONESUR NEGATIVE 07/13/2016 1525   PROTEINUR NEGATIVE 07/13/2016 1525  UROBILINOGEN 1.0 07/19/2015 1126   UROBILINOGEN 0.2 12/02/2014 0150   NITRITE NEGATIVE 07/13/2016 1525   LEUKOCYTESUR TRACE (A) 07/13/2016 1525   Sepsis Labs Invalid input(s): PROCALCITONIN,  WBC,  LACTICIDVEN Microbiology Recent Results (from the past 240 hour(s))  CSF culture     Status: None (Preliminary result)   Collection  Time: 07/13/16 12:43 PM  Result Value Ref Range Status   Specimen Description CSF  Final   Special Requests NONE  Final   Gram Stain   Final    WBC PRESENT, PREDOMINANTLY MONONUCLEAR NO ORGANISMS SEEN CYTOSPIN SMEAR    Culture   Final    NO GROWTH < 24 HOURS Performed at [facility]    Report Status PENDING  Incomplete     Time coordinating discharge: Over 30 minutes  SIGNED:   Calvert CantorIZWAN,Kindred Reidinger, MD  [facility] 07/14/2016, 11:11 AM Pager   If 7PM-7AM, please contact night-coverage [email or web address] Password [email or web address]

## 2016-07-15 LAB — HERPES SIMPLEX VIRUS(HSV) DNA BY PCR
HSV 1 DNA: NEGATIVE
HSV 2 DNA: NEGATIVE

## 2016-07-15 LAB — CBC WITH DIFFERENTIAL/PLATELET
Basophils Absolute: 0 10*3/uL (ref 0.0–0.1)
Basophils Relative: 0 %
Eosinophils Absolute: 0.1 10*3/uL (ref 0.0–0.7)
Eosinophils Relative: 1 %
HCT: 37.1 % (ref 36.0–46.0)
Hemoglobin: 13 g/dL (ref 12.0–15.0)
Lymphocytes Relative: 16 %
Lymphs Abs: 1.6 10*3/uL (ref 0.7–4.0)
MCH: 32.2 pg (ref 26.0–34.0)
MCHC: 35 g/dL (ref 30.0–36.0)
MCV: 91.8 fL (ref 78.0–100.0)
Monocytes Absolute: 0.5 10*3/uL (ref 0.1–1.0)
Monocytes Relative: 5 %
Neutro Abs: 7.9 10*3/uL — ABNORMAL HIGH (ref 1.7–7.7)
Neutrophils Relative %: 78 %
Platelets: 239 10*3/uL (ref 150–400)
RBC: 4.04 MIL/uL (ref 3.87–5.11)
RDW: 13.4 % (ref 11.5–15.5)
WBC: 10.1 10*3/uL (ref 4.0–10.5)

## 2016-07-15 LAB — PATHOLOGIST SMEAR REVIEW: Path Review: INCREASED

## 2016-07-16 LAB — HSV CULTURE AND TYPING

## 2016-07-17 LAB — CSF CULTURE

## 2016-07-17 LAB — CSF CULTURE W GRAM STAIN: Culture: NO GROWTH

## 2016-07-17 LAB — VARICELLA-ZOSTER BY PCR: Varicella-Zoster, PCR: POSITIVE — AB

## 2016-07-18 LAB — CULTURE, BLOOD (ROUTINE X 2)
Culture: NO GROWTH
Culture: NO GROWTH

## 2016-07-19 DIAGNOSIS — B021 Zoster meningitis: Secondary | ICD-10-CM | POA: Diagnosis not present

## 2016-07-19 DIAGNOSIS — Z09 Encounter for follow-up examination after completed treatment for conditions other than malignant neoplasm: Secondary | ICD-10-CM | POA: Diagnosis not present

## 2016-07-19 DIAGNOSIS — Z6825 Body mass index (BMI) 25.0-25.9, adult: Secondary | ICD-10-CM | POA: Diagnosis not present

## 2016-07-19 DIAGNOSIS — I10 Essential (primary) hypertension: Secondary | ICD-10-CM | POA: Diagnosis not present

## 2016-08-15 DIAGNOSIS — F3181 Bipolar II disorder: Secondary | ICD-10-CM | POA: Diagnosis not present

## 2016-08-22 DIAGNOSIS — F3181 Bipolar II disorder: Secondary | ICD-10-CM | POA: Diagnosis not present

## 2016-08-26 DIAGNOSIS — J069 Acute upper respiratory infection, unspecified: Secondary | ICD-10-CM | POA: Diagnosis not present

## 2016-08-29 DIAGNOSIS — J101 Influenza due to other identified influenza virus with other respiratory manifestations: Secondary | ICD-10-CM | POA: Diagnosis not present

## 2016-08-29 DIAGNOSIS — Z6825 Body mass index (BMI) 25.0-25.9, adult: Secondary | ICD-10-CM | POA: Diagnosis not present

## 2016-10-01 DIAGNOSIS — F3181 Bipolar II disorder: Secondary | ICD-10-CM | POA: Diagnosis not present

## 2016-10-17 DIAGNOSIS — F3181 Bipolar II disorder: Secondary | ICD-10-CM | POA: Diagnosis not present

## 2016-10-22 ENCOUNTER — Telehealth: Payer: Self-pay | Admitting: Family Medicine

## 2016-10-22 DIAGNOSIS — F3181 Bipolar II disorder: Secondary | ICD-10-CM | POA: Diagnosis not present

## 2016-10-22 NOTE — Telephone Encounter (Signed)
It's ok with me. 

## 2016-10-22 NOTE — Telephone Encounter (Signed)
Patient wants to transfer PCP to [place] location.

## 2016-10-23 ENCOUNTER — Telehealth: Payer: Self-pay | Admitting: Family Medicine

## 2016-10-23 NOTE — Telephone Encounter (Signed)
Happy to accept the patient.

## 2016-10-30 DIAGNOSIS — F3181 Bipolar II disorder: Secondary | ICD-10-CM | POA: Diagnosis not present

## 2016-10-31 ENCOUNTER — Encounter: Payer: Self-pay | Admitting: Family Medicine

## 2016-10-31 ENCOUNTER — Ambulatory Visit: Payer: Self-pay | Admitting: Family Medicine

## 2016-10-31 ENCOUNTER — Ambulatory Visit (INDEPENDENT_AMBULATORY_CARE_PROVIDER_SITE_OTHER): Payer: BLUE CROSS/BLUE SHIELD | Admitting: Family Medicine

## 2016-10-31 VITALS — BP 138/82 | HR 102 | Temp 98.5°F | Ht 64.0 in | Wt 143.2 lb

## 2016-10-31 DIAGNOSIS — E538 Deficiency of other specified B group vitamins: Secondary | ICD-10-CM | POA: Diagnosis not present

## 2016-10-31 DIAGNOSIS — E559 Vitamin D deficiency, unspecified: Secondary | ICD-10-CM | POA: Diagnosis not present

## 2016-10-31 DIAGNOSIS — Z8619 Personal history of other infectious and parasitic diseases: Secondary | ICD-10-CM

## 2016-10-31 DIAGNOSIS — Z23 Encounter for immunization: Secondary | ICD-10-CM | POA: Diagnosis not present

## 2016-10-31 DIAGNOSIS — E119 Type 2 diabetes mellitus without complications: Secondary | ICD-10-CM | POA: Diagnosis not present

## 2016-10-31 LAB — CBC WITH DIFFERENTIAL/PLATELET
Basophils Absolute: 0.1 10*3/uL (ref 0.0–0.1)
Basophils Relative: 0.5 % (ref 0.0–3.0)
Eosinophils Absolute: 0.1 10*3/uL (ref 0.0–0.7)
Eosinophils Relative: 1.1 % (ref 0.0–5.0)
HCT: 38.1 % (ref 36.0–46.0)
Hemoglobin: 12.7 g/dL (ref 12.0–15.0)
Lymphocytes Relative: 20.6 % (ref 12.0–46.0)
Lymphs Abs: 2.4 10*3/uL (ref 0.7–4.0)
MCHC: 33.4 g/dL (ref 30.0–36.0)
MCV: 94.2 fl (ref 78.0–100.0)
Monocytes Absolute: 0.7 10*3/uL (ref 0.1–1.0)
Monocytes Relative: 5.9 % (ref 3.0–12.0)
Neutro Abs: 8.5 10*3/uL — ABNORMAL HIGH (ref 1.4–7.7)
Neutrophils Relative %: 71.9 % (ref 43.0–77.0)
Platelets: 282 10*3/uL (ref 150.0–400.0)
RBC: 4.05 Mil/uL (ref 3.87–5.11)
RDW: 13.1 % (ref 11.5–15.5)
WBC: 11.8 10*3/uL — ABNORMAL HIGH (ref 4.0–10.5)

## 2016-10-31 LAB — LIPID PANEL
Cholesterol: 175 mg/dL (ref 0–200)
HDL: 38.7 mg/dL — ABNORMAL LOW (ref >39.00)
LDL Cholesterol: 112 mg/dL — ABNORMAL HIGH (ref 0–99)
NonHDL: 135.8
Total CHOL/HDL Ratio: 5
Triglycerides: 117 mg/dL (ref 0.0–149.0)
VLDL: 23.4 mg/dL (ref 0.0–40.0)

## 2016-10-31 LAB — COMPREHENSIVE METABOLIC PANEL
ALT: 69 U/L — ABNORMAL HIGH (ref 0–35)
AST: 30 U/L (ref 0–37)
Albumin: 4.6 g/dL (ref 3.5–5.2)
Alkaline Phosphatase: 95 U/L (ref 39–117)
BUN: 9 mg/dL (ref 6–23)
CO2: 28 mEq/L (ref 19–32)
Calcium: 9.7 mg/dL (ref 8.4–10.5)
Chloride: 103 mEq/L (ref 96–112)
Creatinine, Ser: 0.71 mg/dL (ref 0.40–1.20)
GFR: 94.91 mL/min (ref >60.00)
Glucose, Bld: 102 mg/dL — ABNORMAL HIGH (ref 70–99)
Potassium: 4.1 mEq/L (ref 3.5–5.1)
Sodium: 138 mEq/L (ref 135–145)
Total Bilirubin: 0.8 mg/dL (ref 0.2–1.2)
Total Protein: 7.5 g/dL (ref 6.0–8.3)

## 2016-10-31 LAB — VITAMIN B12: Vitamin B-12: 997 pg/mL — ABNORMAL HIGH (ref 211–911)

## 2016-10-31 LAB — VITAMIN D 25 HYDROXY (VIT D DEFICIENCY, FRACTURES): VITD: 50.99 ng/mL (ref 30.00–100.00)

## 2016-10-31 LAB — HEMOGLOBIN A1C: Hgb A1c MFr Bld: 5.9 % (ref 4.6–6.5)

## 2016-10-31 NOTE — Progress Notes (Signed)
Pre visit review using our clinic review tool, if applicable. No additional management support is needed unless otherwise documented below in the visit note. 

## 2016-10-31 NOTE — Progress Notes (Signed)
Subjective:    Patient ID: Selena Holt, female    DOB: May 20, 1971, 46 y.o.   MRN: [id number]  HPI This is a 46 yo female who presents to establish care. She has complaints.  She is currently working as a long term temp in a clerical position. She has been there 10 months. She enjoys her job, there is a lot of walking. She lives with her son. No increased stress currently. She is still adjusting to having grown children and not needing her as much. She is looking forward to exploring her own interests and hobbies.  Diabetes- She has diabetes which has been well controlled on metformin 500 mg qd.  Last hemoglobin A1c 6. Has cut out sweetened beverages. Admits that her diet is not great, eats out frequently. Sees dentist regularly. Overdue for eye exam.   Vitamin D deficiency- vitamin D level has been up and down of her last several years. She is currently taking low-dose supplementation.  Vitamin B12 deficiency-she was started on oral B12 supplementation when she started metformin. Her level reached greater than 1000. She stopped taking it but developed pains in her legs so she restarted at a lower dose that she takes a couple times a week. Leg pain has resolved.   Past Medical History:  Diagnosis Date  . Anemia   . Bipolar disorder (HCC)   . Collapsed lung    "spontaneous" , teenager  . Diabetes mellitus without complication (HCC)   . HSV-1 (herpes simplex virus 1) infection   . Hypertension   . Kidney stones   . Scoliosis (and kyphoscoliosis), idiopathic    Past Surgical History:  Procedure Laterality Date  . CHEST TUBE INSERTION     Family History  Problem Relation Age of Onset  . Hyperlipidemia Mother   . Hypertension Mother   . Hyperlipidemia Father   . Hypertension Father   . CAD Father   . Heart disease Father   . Alzheimer's disease Maternal Grandmother 46   Social History  Substance Use Topics  . Smoking status: Never Smoker  . Smokeless tobacco: Never Used    . Alcohol use No     Comment: occ       Review of Systems  Respiratory: Negative for cough and shortness of breath.   Cardiovascular: Negative for chest pain and leg swelling.  Genitourinary: Negative for vaginal bleeding (IUD, sees gyn).  Musculoskeletal: Negative.   Psychiatric/Behavioral: Negative for dysphoric mood. The patient is not nervous/anxious.       Objective:   Physical Exam Physical Exam  Constitutional: Oriented to person, place, and time. She appears well-developed and well-nourished.  HENT:  Head: Normocephalic and atraumatic.  Eyes: Conjunctivae are normal.  Neck: Normal range of motion. Neck supple.  Cardiovascular: Normal rate, regular rhythm and normal heart sounds.   Pulmonary/Chest: Effort normal and breath sounds normal.  Musculoskeletal: Normal range of motion. No edema.  Neurological: Alert and oriented to person, place, and time.  Skin: Skin is warm and dry.  Psychiatric: Normal mood and affect. Behavior is normal. Judgment and thought content normal.  Vitals reviewed.   BP 138/82 (BP Location: Right Arm, Patient Position: Sitting, Cuff Size: Normal)   Pulse (!) 102   Temp 98.5 F (36.9 C) (Oral)   Ht  (1.626 m)   Wt 143 lb 3.2 oz (65 kg)   SpO2 98%   BMI 24.58 kg/m  BP Readings from Last 3 Encounters:  10/31/16 138/82  07/14/16 (!) 145/76  08/15/15 125/83   Wt Readings from Last 3 Encounters:  10/31/16 143 lb 3.2 oz (65 kg)  07/13/16 149 lb (67.6 kg)  08/15/15 150 lb (68 kg)   Depression screen PHQ 2/9 10/31/2016  Decreased Interest 0  Down, Depressed, Hopeless 0  PHQ - 2 Score 0       Assessment & Plan:  1. Controlled type 2 diabetes mellitus without complication, without long-term current use of insulin (HCC) - encouraged healthy food choices and discussed meal planning - CBC with Differential/Platelet - Comprehensive metabolic panel - Hemoglobin A1c - Lipid panel  2. Vitamin D deficiency - VITAMIN D 25 Hydroxy  (Vit-D Deficiency, Fractures)  3. Vitamin B12 deficiency - Vitamin B12  4. Need for pneumococcal vaccine - Pneumococcal conjugate vaccine 13-valent IM  5. History of varicella infection - had varicella meningitis 12/17, will vaccinate for varicella 12/18.    Olean Ree, FNP-BC  [facility] [place], [facility]  10/31/2016 1:21 PM

## 2016-10-31 NOTE — Patient Instructions (Addendum)
Please consider adding yoga/stretching, most [employer] offer a free class to try  website- meetup (for activities, exercise classes)  Find fun things to do!

## 2016-11-03 MED ORDER — ATORVASTATIN CALCIUM 10 MG PO TABS: 10.0000 mg | ORAL_TABLET | Freq: Every day | ORAL | 3 refills | Status: DC

## 2016-11-03 NOTE — Addendum Note (Signed)
Addended by: Olean Ree B on: 11/03/2016 02:19 PM   Modules accepted: Orders

## 2016-11-06 DIAGNOSIS — F3181 Bipolar II disorder: Secondary | ICD-10-CM | POA: Diagnosis not present

## 2016-11-12 DIAGNOSIS — F3181 Bipolar II disorder: Secondary | ICD-10-CM | POA: Diagnosis not present

## 2016-11-18 DIAGNOSIS — F3181 Bipolar II disorder: Secondary | ICD-10-CM | POA: Diagnosis not present

## 2016-11-27 ENCOUNTER — Other Ambulatory Visit (HOSPITAL_COMMUNITY)
Admission: RE | Admit: 2016-11-27 | Discharge: 2016-11-27 | Disposition: A | Discharge status: Home / Self Care | Payer: BLUE CROSS/BLUE SHIELD | Source: Ambulatory Visit | Attending: Physician Assistant | Admitting: Physician Assistant

## 2016-11-27 ENCOUNTER — Ambulatory Visit (INDEPENDENT_AMBULATORY_CARE_PROVIDER_SITE_OTHER): Payer: BLUE CROSS/BLUE SHIELD

## 2016-11-27 ENCOUNTER — Encounter: Payer: Self-pay | Admitting: Physician Assistant

## 2016-11-27 ENCOUNTER — Telehealth: Payer: Self-pay | Admitting: *Deleted

## 2016-11-27 ENCOUNTER — Ambulatory Visit (INDEPENDENT_AMBULATORY_CARE_PROVIDER_SITE_OTHER): Payer: BLUE CROSS/BLUE SHIELD | Admitting: Physician Assistant

## 2016-11-27 VITALS — BP 126/80 | HR 92 | Temp 99.1°F | Ht 64.0 in | Wt 146.0 lb

## 2016-11-27 DIAGNOSIS — N898 Other specified noninflammatory disorders of vagina: Secondary | ICD-10-CM | POA: Insufficient documentation

## 2016-11-27 DIAGNOSIS — R140 Abdominal distension (gaseous): Secondary | ICD-10-CM | POA: Diagnosis not present

## 2016-11-27 DIAGNOSIS — R300 Dysuria: Secondary | ICD-10-CM | POA: Diagnosis not present

## 2016-11-27 DIAGNOSIS — R1013 Epigastric pain: Secondary | ICD-10-CM | POA: Diagnosis not present

## 2016-11-27 LAB — POCT URINALYSIS DIPSTICK
Bilirubin, UA: NEGATIVE
Blood, UA: NEGATIVE
Glucose, UA: NEGATIVE
Ketones, UA: NEGATIVE
Leukocytes, UA: NEGATIVE
Nitrite, UA: NEGATIVE
Protein, UA: NEGATIVE
Spec Grav, UA: 1.015 (ref 1.010–1.025)
Urobilinogen, UA: 2 E.U./dL — AB
pH, UA: 7 (ref 5.0–8.0)

## 2016-11-27 LAB — POCT URINE PREGNANCY: Preg Test, Ur: NEGATIVE

## 2016-11-27 NOTE — Progress Notes (Signed)
Selena Holt is a 46 y.o. female here for abdominal pain, mid epigastric pain.  I acted as a Selena Holt for [employer], PA-C Selena Mull, LPN  History of Present Illness:   Chief Complaint  Patient presents with  . Abdominal Pain    Epigastric area x 3 weeks, off and on, worse past 2 days  . Bloated    x 3 days    HPI Approximately 9-10 years ago went 3 months without having a BM. Interestingly, she has a history of abdominal distention in March 2015 where she presented with similar symptoms as she does now --- severe abdominal bloating with very mild abdominal pain. She had an abdominal ultrasound at that time that just showed fatty liver, labs revealed elevated AST and ALT. She states that all of this bloating started when she started taking Metformin around this same time. She reports that her diabetes is well controlled now she is only on 500 mg of metformin daily. Has been having a BM every 3 days for the past 3-4 weeks. Takes a generic stool softener, approximately 2-3 tablets only on the weekend. Endorses normal urine output, but does have occasional burning with urination. Over the past month, has noted mucus-like discharge, she is not sure if its from her vagina or rectum. Not sexually active. No odor to the mucus. Does see an ob-gyn and is due to go back. No back pain. No vomiting but does have nausea. Over the past few days she has been eating fast food, drinking a lot of water, and also drinks 1 x 16 oz diet coke daily. No hemorrhoids. No blood from stool. Does not get regular periods. Has an IUD in place. Does report that she has a cystocele that sometimes prevents her from being able to empty her bladder all the way, she has had two vaginal births. She is passing gas.  PMHx, SurgHx, SocialHx, Medications, and Allergies were reviewed in the Visit Navigator and updated as appropriate.  Current Medications:   Current Outpatient Prescriptions:  .  cetirizine (ZYRTEC) 10 MG  tablet, Take 10 mg by mouth daily., Disp: , Rfl:  .  Cholecalciferol (D3 MAXIMUM STRENGTH) 5000 UNIT/ML LIQD, Take 1 mL (5,000 Units total) by mouth daily with supper., Disp: 30 mL, Rfl: 2 .  Cyanocobalamin (VITAMIN B-12 PO), Take 1,200 mcg by mouth daily., Disp: , Rfl:  .  ferrous gluconate (FERGON) 324 MG tablet, Take 1 tablet (324 mg total) by mouth daily., Disp: 30 tablet, Rfl: 0 .  fluticasone (FLONASE) 50 MCG/ACT nasal spray, Place into both nostrils daily., Disp: , Rfl:  .  glucose blood test strip, Use as instructed to check blood sugar twice daily, Disp: 200 each, Rfl: 3 .  levonorgestrel (MIRENA) 20 MCG/24HR IUD, 1 each by Intrauterine route once., Disp: , Rfl:  .  atorvastatin (LIPITOR) 10 MG tablet, Take 1 tablet (10 mg total) by mouth daily. (Patient not taking: Reported on 11/27/2016), Disp: 90 tablet, Rfl: 3 .  metFORMIN (GLUCOPHAGE) 500 MG tablet, Take 1 tablet (500 mg total) by mouth daily with breakfast., Disp: 90 tablet, Rfl: 0   Review of Systems:   Review of Systems  Constitutional: Negative.   Respiratory: Negative for cough and shortness of breath.   Cardiovascular: Positive for leg swelling. Negative for chest pain and palpitations.  Gastrointestinal: Positive for abdominal pain. Negative for nausea and vomiting.       Bloating  Genitourinary: Positive for dysuria.       X  3-4 days  Musculoskeletal: Negative.   Neurological: Negative for dizziness and headaches.  Endo/Heme/Allergies: Negative for polydipsia.  Psychiatric/Behavioral: Negative.     Vitals:   Vitals:   11/27/16 1552  BP: 126/80  Pulse: 92  Temp: 99.1 F (37.3 C)  TempSrc: Oral  SpO2: 99%  Weight: 146 lb (66.2 kg)  Height: 5\' 4"  (1.626 m)     Body mass index is 25.06 kg/m.  Physical Exam:   Physical Exam  Constitutional: She appears well-developed. She is cooperative.  Non-toxic appearance. She does not have a sickly appearance. She does not appear ill. No distress.  Cardiovascular:  Normal rate, regular rhythm, S1 normal, S2 normal, normal heart sounds and normal pulses.   No LE edema  Pulmonary/Chest: Effort normal and breath sounds normal.  Abdominal: Bowel sounds are normal. She exhibits distension (significant and generalized). There is generalized tenderness. There is no rigidity, no rebound, no guarding, no CVA tenderness, no tenderness at McBurney's point and negative Murphy's sign.  Genitourinary: There is no rash, tenderness or lesion on the right labia. There is no rash, tenderness or lesion on the left labia.  Genitourinary Comments: Unable to visualize cervix or IUD strings. She has a prolapse present. Appears to be a cystocele. Also with white discharge no odor.  Neurological: She is alert.  Nursing note and vitals reviewed.   Results for orders placed or performed in visit on 11/27/16  POCT urinalysis dipstick  Result Value Ref Range   Color, UA Yellow    Clarity, UA Hazy    Glucose, UA Negative    Bilirubin, UA Negative    Ketones, UA Negative    Spec Grav, UA 1.015 1.010 - 1.025   Blood, UA Negative    pH, UA 7.0 5.0 - 8.0   Protein, UA Negative    Urobilinogen, UA 2.0 (A) 0.2 or 1.0 E.U./dL   Nitrite, UA Negative    Leukocytes, UA Negative Negative  POCT urine pregnancy  Result Value Ref Range   Preg Test, Ur Negative Negative   IMPRESSION: Small to moderate stool burden, with moderate stool distending the rectum. No evidence of obstruction.  Assessment and Plan:   Selena Holt was seen today for abdominal pain and bloated.  Diagnoses and all orders for this visit:  Dysuria Urinalysis is unrevealing. Point of care pregnancy test is negative. Will send for culture for further evaluation. -     POCT urinalysis dipstick -     POCT urine pregnancy -     Urine culture -     CBC with Differential/Platelet -     Comprehensive metabolic panel  Vaginal discharge Wet prep performed. Will call patient with results. -     CBC with  Differential/Platelet -     Cervicovaginal ancillary only  Epigastric pain Selena Holt in to evaluate patient. I discussed a bowel regimen with patient including MiraLAX 1-2 capsules daily with goal of daily large bowel movement. We also discussed high-fiber foods as well as continued water. Labs for further evaluation as well as abdominal x-ray. X-ray shows small to moderate stool burden, with moderate stool distending the rectum. After review of labs and follow-up call with patient we will decide on further tests/imaging. -     CBC with Differential/Platelet -     Comprehensive metabolic panel -     Lipase -     DG Abd 2 Views    . Reviewed expectations re: course of current medical issues. . Discussed self-management of  symptoms. . Outlined signs and symptoms indicating need for more acute intervention. . Patient verbalized understanding and all questions were answered. . See orders for this visit as documented in the electronic medical record. . Patient received an After-Visit Summary.  CMA or LPN served as scribe during this visit. History, Physical, and Plan performed by medical provider. Documentation and orders reviewed and attested to.  Jarold MottoSamantha Obrien Huskins, PA-C

## 2016-11-27 NOTE — Telephone Encounter (Signed)
Left message on voicemail to call office. Need to triage abdominal pain.

## 2016-11-27 NOTE — Telephone Encounter (Signed)
Left message on voicemail to call office. Need to Triage Abdominal pain.

## 2016-11-27 NOTE — Telephone Encounter (Signed)
Pt presented to office and was seen.

## 2016-11-27 NOTE — Patient Instructions (Addendum)
It was great meeting you!  Start taking 1-2 capfuls of miralax daily to help with having regular bowel movements. Make sure that you are eating plenty of high fiber foods, such as leafy greens, whole grains, beans and fresh fruits/vegetables. Drink plenty of water.  We will call you with your lab results.   If you develop any severe changes in abdominal pain, or if you develop fever or shortness of breath, please go to the emergency room.    Abdominal Pain, Adult Abdominal pain can be caused by many things. Often, abdominal pain is not serious and it gets better with no treatment or by being treated at home. However, sometimes abdominal pain is serious. Your health care provider will do a medical history and a physical exam to try to determine the cause of your abdominal pain. Follow these instructions at home:  Take over-the-counter and prescription medicines only as told by your health care provider. Do not take a laxative unless told by your health care provider.  Drink enough fluid to keep your urine clear or pale yellow.  Watch your condition for any changes.  Keep all follow-up visits as told by your health care provider. This is important. Contact a health care provider if:  Your abdominal pain changes or gets worse.  You are not hungry or you lose weight without trying.  You are constipated or have diarrhea for more than 2-3 days.  You have pain when you urinate or have a bowel movement.  Your abdominal pain wakes you up at night.  Your pain gets worse with meals, after eating, or with certain foods.  You are throwing up and cannot keep anything down.  You have a fever. Get help right away if:  Your pain does not go away as soon as your health care provider told you to expect.  You cannot stop throwing up.  Your pain is only in areas of the abdomen, such as the right side or the left lower portion of the abdomen.  You have bloody or black stools, or stools that  look like tar.  You have severe pain, cramping, or bloating in your abdomen.  You have signs of dehydration, such as:  Dark urine, very little urine, or no urine.  Cracked lips.  Dry mouth.  Sunken eyes.  Sleepiness.  Weakness. This information is not intended to replace advice given to you by your health care provider. Make sure you discuss any questions you have with your health care provider. Document Released: 04/16/2005 Document Revised: 01/25/2016 Document Reviewed: 12/19/2015 Elsevier Interactive Patient Education  2017 [employer]

## 2016-11-28 ENCOUNTER — Telehealth: Payer: Self-pay | Admitting: Family Medicine

## 2016-11-28 LAB — CBC WITH DIFFERENTIAL/PLATELET
Basophils Absolute: 0.1 10*3/uL (ref 0.0–0.1)
Basophils Relative: 0.8 % (ref 0.0–3.0)
Eosinophils Absolute: 0.1 10*3/uL (ref 0.0–0.7)
Eosinophils Relative: 1.3 % (ref 0.0–5.0)
HCT: 37.5 % (ref 36.0–46.0)
Hemoglobin: 12.4 g/dL (ref 12.0–15.0)
Lymphocytes Relative: 22.4 % (ref 12.0–46.0)
Lymphs Abs: 2.7 10*3/uL (ref 0.7–4.0)
MCHC: 33.2 g/dL (ref 30.0–36.0)
MCV: 93.9 fl (ref 78.0–100.0)
Monocytes Absolute: 0.7 10*3/uL (ref 0.1–1.0)
Monocytes Relative: 6.1 % (ref 3.0–12.0)
Neutro Abs: 8.2 10*3/uL — ABNORMAL HIGH (ref 1.4–7.7)
Neutrophils Relative %: 69.4 % (ref 43.0–77.0)
Platelets: 286 10*3/uL (ref 150.0–400.0)
RBC: 4 Mil/uL (ref 3.87–5.11)
RDW: 13.2 % (ref 11.5–15.5)
WBC: 11.9 10*3/uL — ABNORMAL HIGH (ref 4.0–10.5)

## 2016-11-28 LAB — COMPREHENSIVE METABOLIC PANEL
ALT: 37 U/L — ABNORMAL HIGH (ref 0–35)
AST: 23 U/L (ref 0–37)
Albumin: 4.8 g/dL (ref 3.5–5.2)
Alkaline Phosphatase: 79 U/L (ref 39–117)
BUN: 9 mg/dL (ref 6–23)
CO2: 29 mEq/L (ref 19–32)
Calcium: 9.7 mg/dL (ref 8.4–10.5)
Chloride: 103 mEq/L (ref 96–112)
Creatinine, Ser: 0.72 mg/dL (ref 0.40–1.20)
GFR: 93.36 mL/min (ref >60.00)
Glucose, Bld: 119 mg/dL — ABNORMAL HIGH (ref 70–99)
Potassium: 4.1 mEq/L (ref 3.5–5.1)
Sodium: 138 mEq/L (ref 135–145)
Total Bilirubin: 0.5 mg/dL (ref 0.2–1.2)
Total Protein: 7.6 g/dL (ref 6.0–8.3)

## 2016-11-28 LAB — LIPASE: Lipase: 32 U/L (ref 11.0–59.0)

## 2016-11-28 LAB — URINE CULTURE

## 2016-11-28 NOTE — Telephone Encounter (Signed)
Noted  

## 2016-11-28 NOTE — Telephone Encounter (Signed)
Patient called to get results from labs she had yesterday. I advised patient that the clinical staff would give her a call once the results were ready. It is okay to leave a detailed message on her phone about results.

## 2016-12-01 ENCOUNTER — Encounter: Payer: Self-pay | Admitting: Family Medicine

## 2016-12-01 ENCOUNTER — Encounter: Payer: Self-pay | Admitting: Physician Assistant

## 2016-12-01 LAB — CERVICOVAGINAL ANCILLARY ONLY
Bacterial vaginitis: NEGATIVE
Candida vaginitis: NEGATIVE
Trichomonas: NEGATIVE

## 2016-12-02 DIAGNOSIS — F3181 Bipolar II disorder: Secondary | ICD-10-CM | POA: Diagnosis not present

## 2016-12-16 DIAGNOSIS — F3181 Bipolar II disorder: Secondary | ICD-10-CM | POA: Diagnosis not present

## 2016-12-23 ENCOUNTER — Telehealth: Payer: Self-pay | Admitting: Family Medicine

## 2016-12-23 NOTE — Telephone Encounter (Signed)
Called patient to sched acute visit, no answer.  Left vmail advising her to call back to for appt w/ NP Leone PayorGessner at her convenience. She is Gessner's pt, not [facility] my mistake.  Thank you,  -LL

## 2016-12-23 NOTE — Telephone Encounter (Signed)
Pt of [facility] is having pain in face, losing voice, sneezing. Patient is requesting a script for  z-pack.  Thank you,  -LL

## 2016-12-23 NOTE — Telephone Encounter (Signed)
Pt has not been seen for these sx. Will need an appointment to address.

## 2016-12-24 IMAGING — CR DG THORACIC SPINE 3V
3 series · 3 of 3 positions shown · non-contrast
Comparison: 12/02/2014

CLINICAL DATA: Left foot drop

EXAM:
THORACIC SPINE - 3 VIEWS

[t t-spine a.p.]
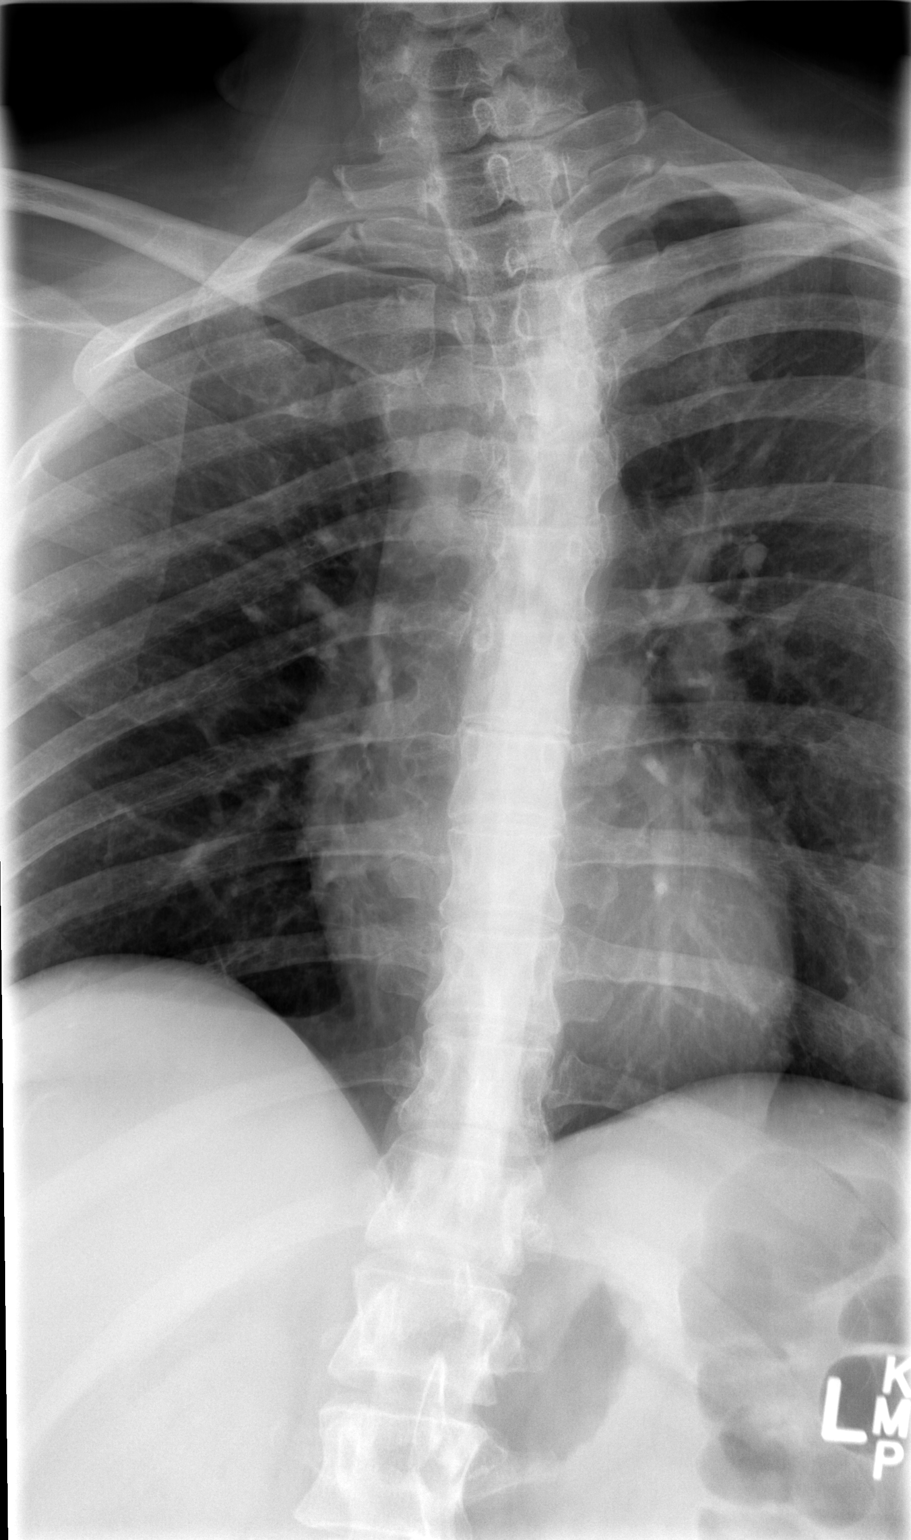

[t t-spine lat]
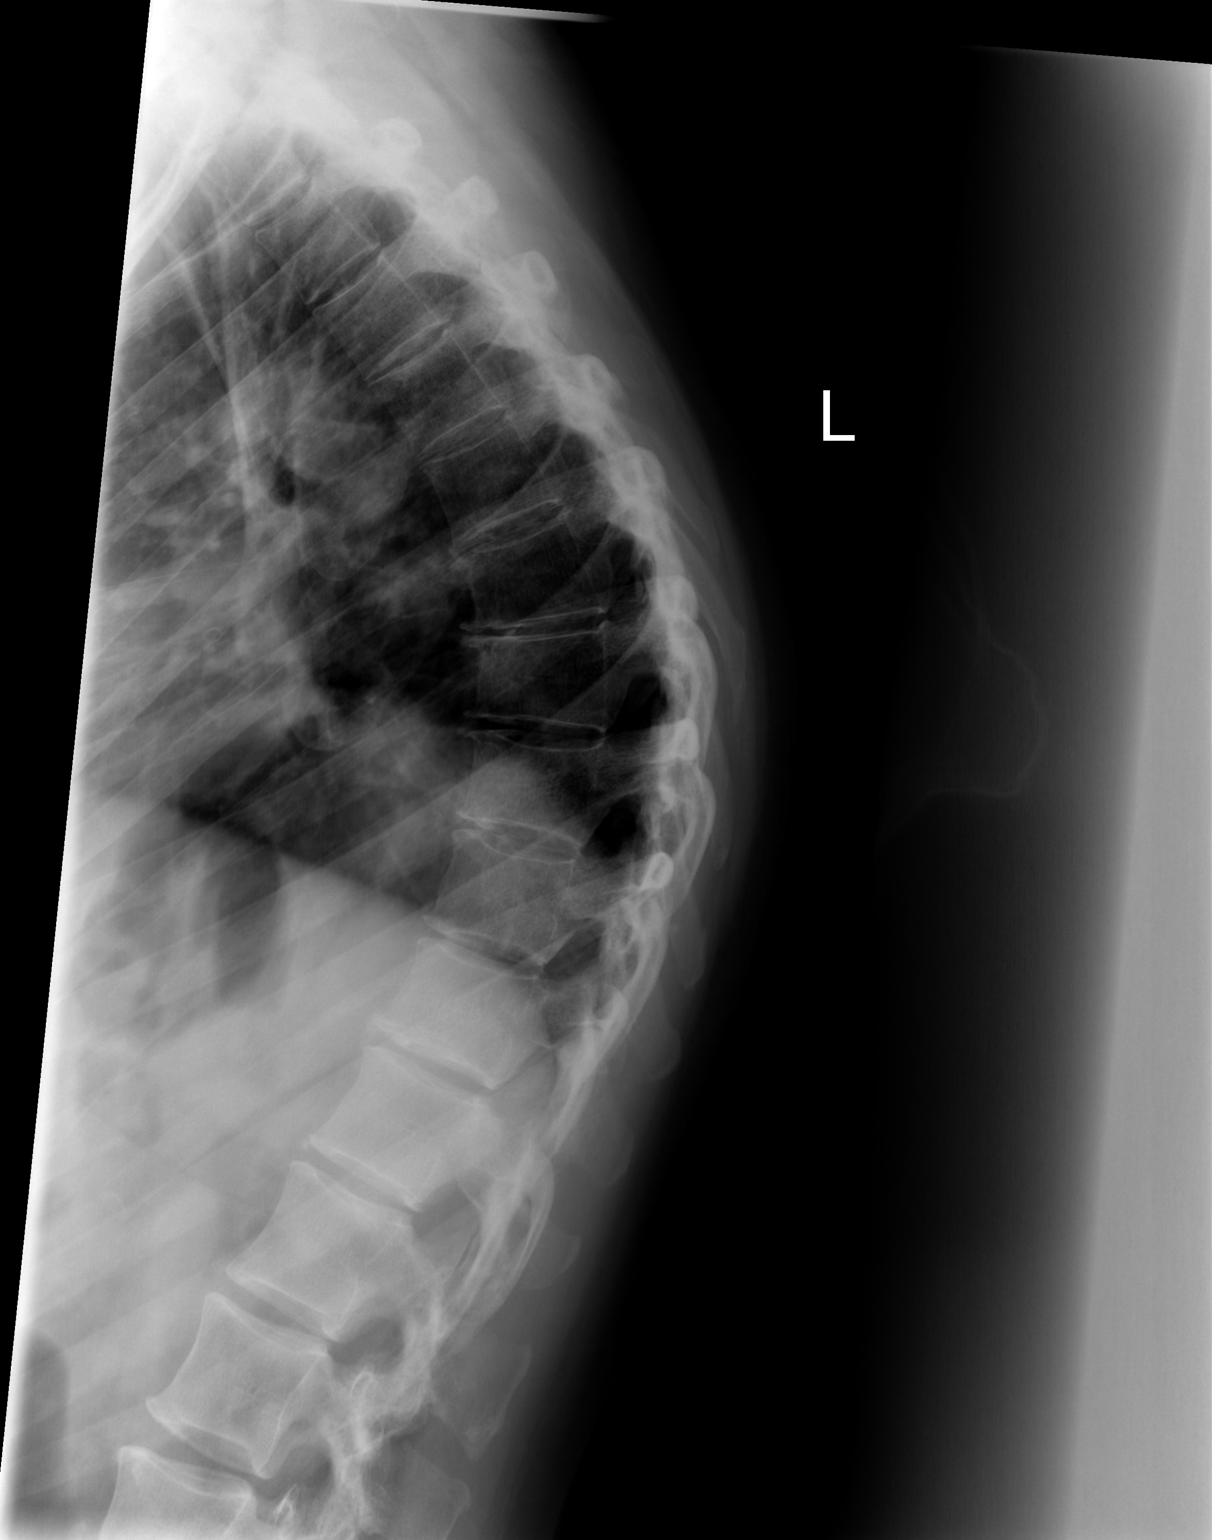

[t swimmers]
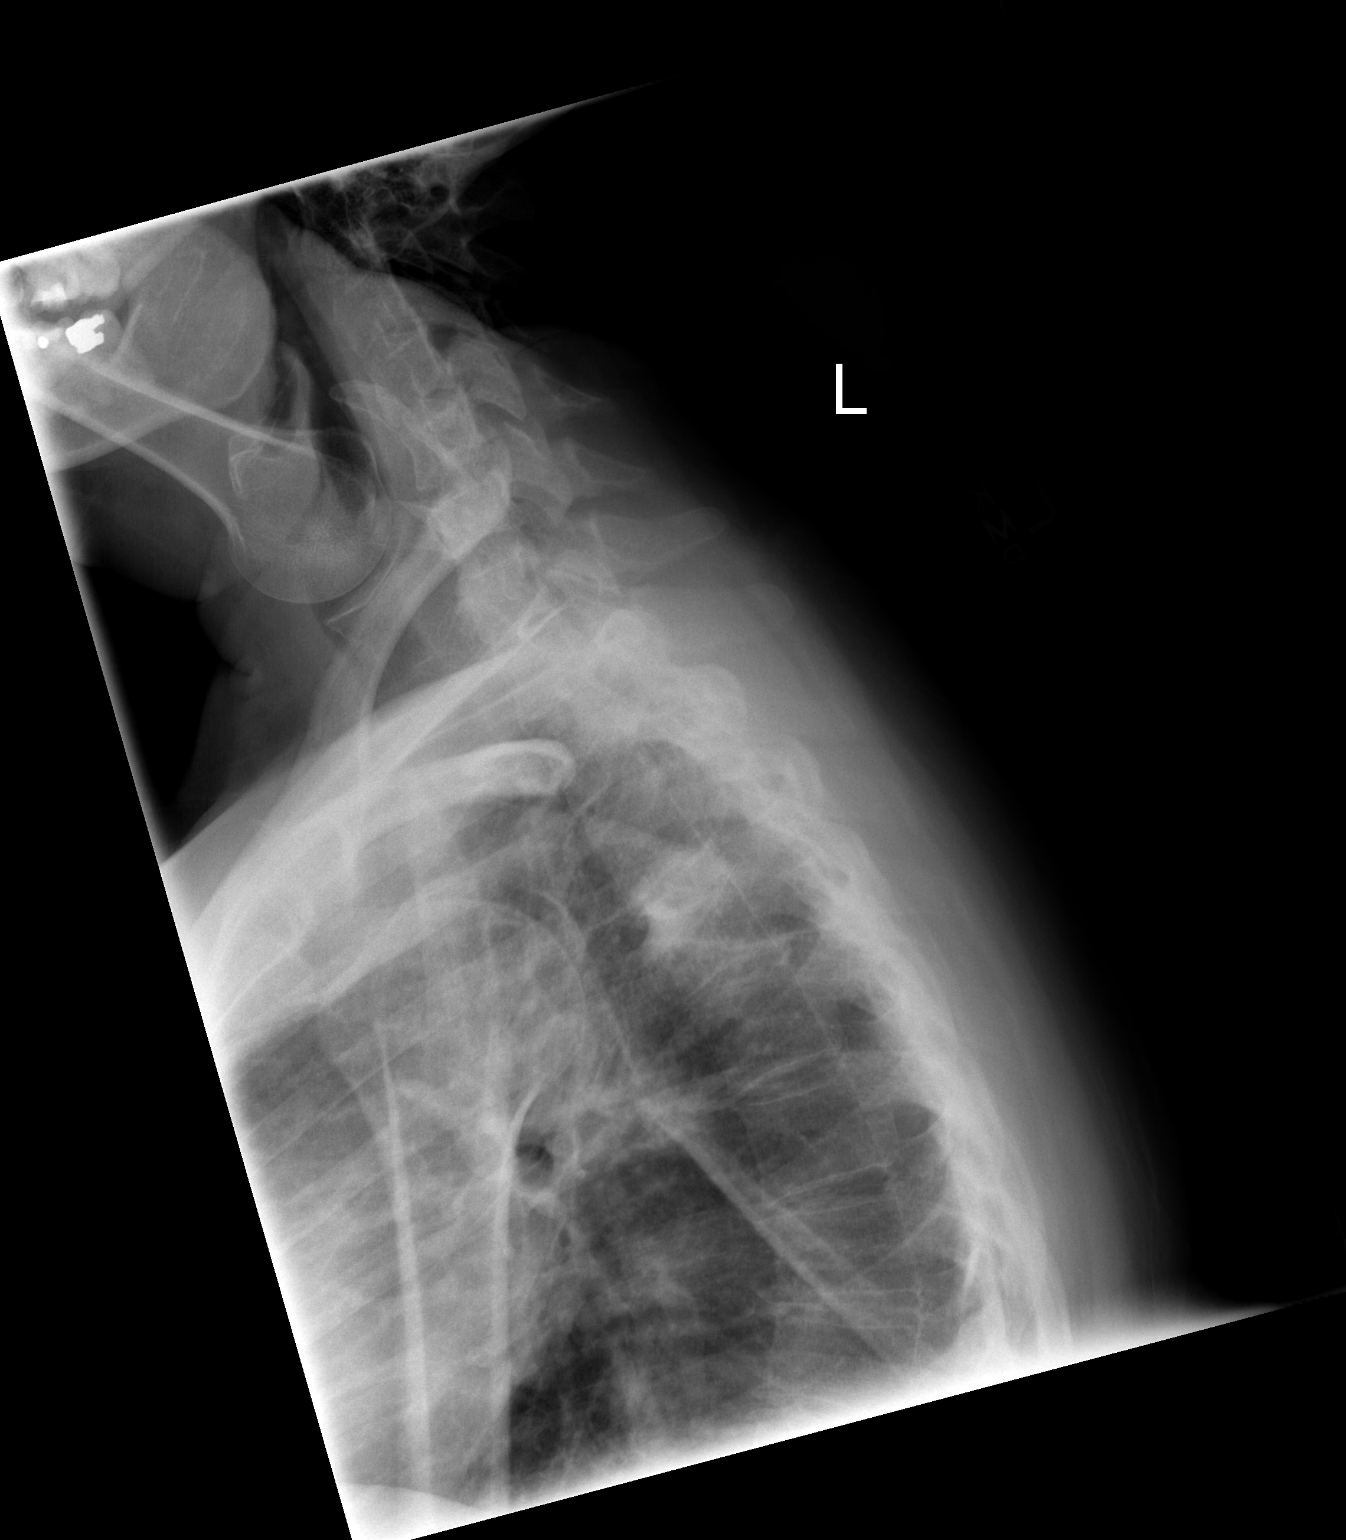

[3 of 3 positions shown; findings below may reference images not displayed]

FINDINGS: Kyphosis is again identified. Mild scoliosis is also seen concave to
the right in the upper thoracic spine. No rib abnormality is noted.
No compression deformities are seen.
IMPRESSION: Stable kyphoscoliosis

## 2016-12-24 IMAGING — CR DG LUMBAR SPINE COMPLETE 4+V
5 series · 5 of 5 positions shown · non-contrast
Comparison: Abdominal films October 21, 2012 ;

CLINICAL DATA: Unexplained left foot drop common no back pain or
other complaints, no history of back injury, history of scoliosis

EXAM:
LUMBAR SPINE - COMPLETE 4+ VIEW

[t l-spine a.p.]
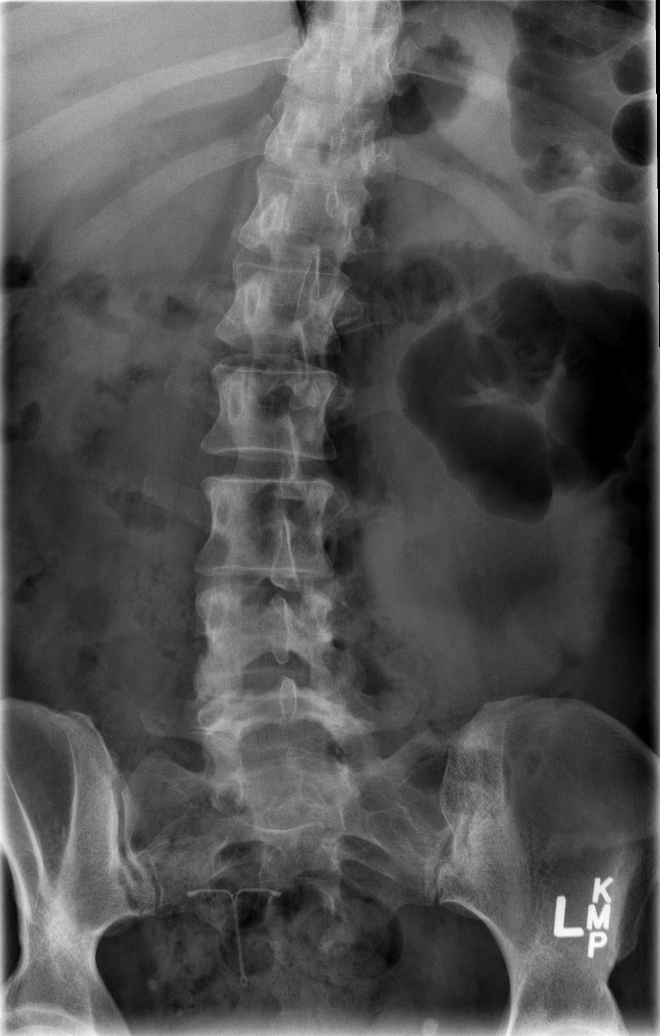

[t l-spine oblique exposure (1 of 2)]
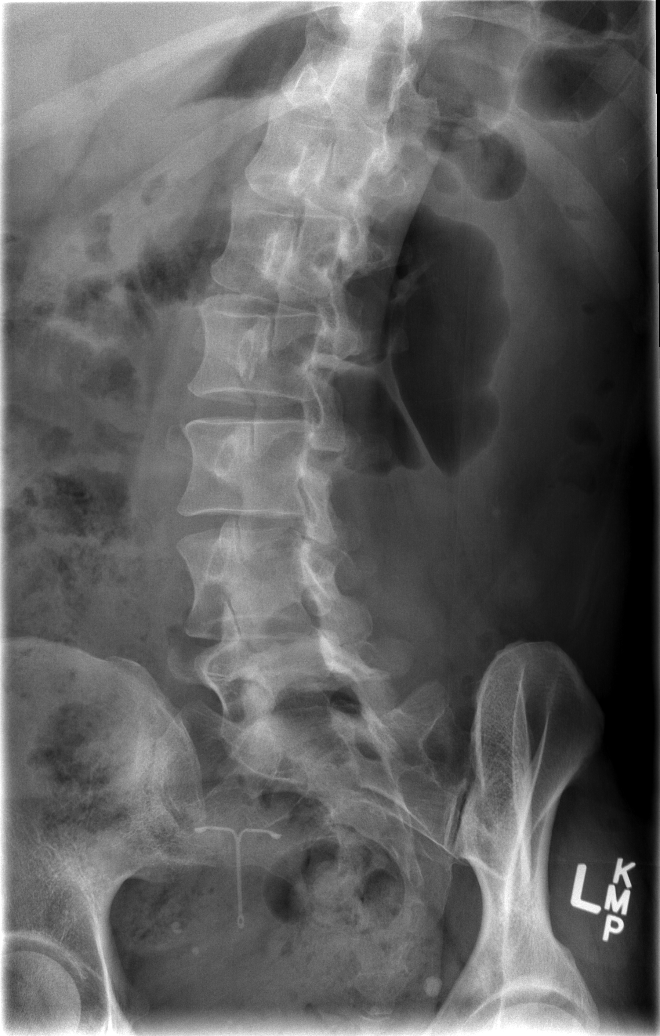

[t l-spine oblique exposure (2 of 2)]
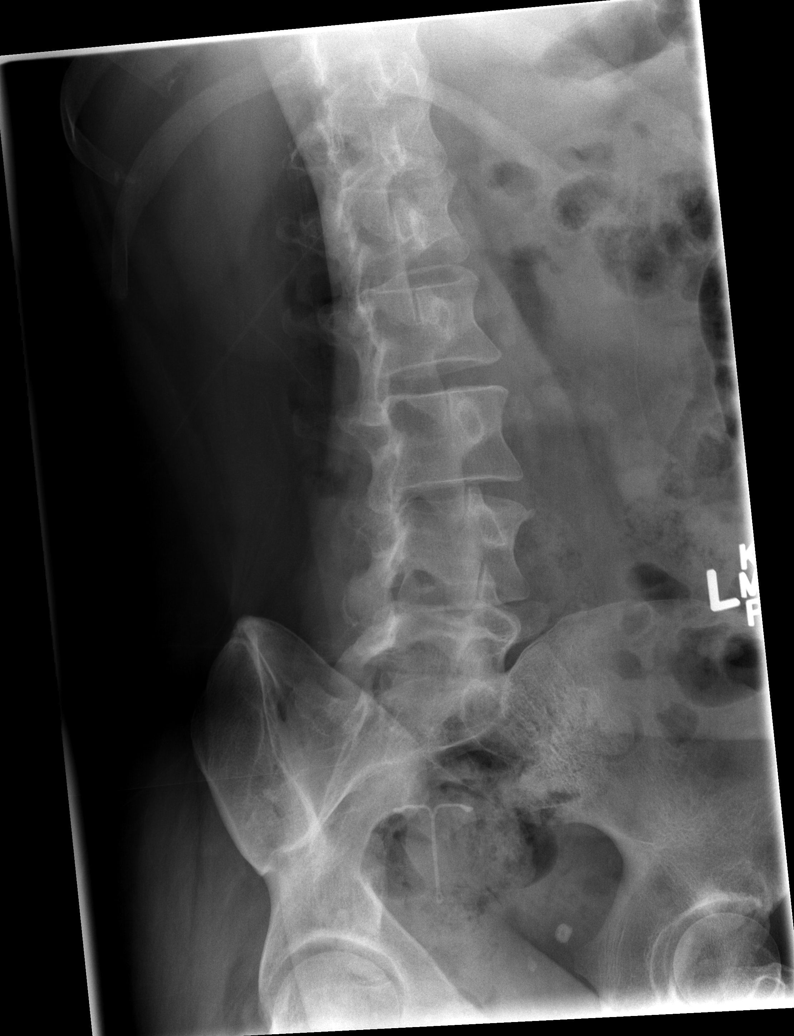

[t l-spine lat]
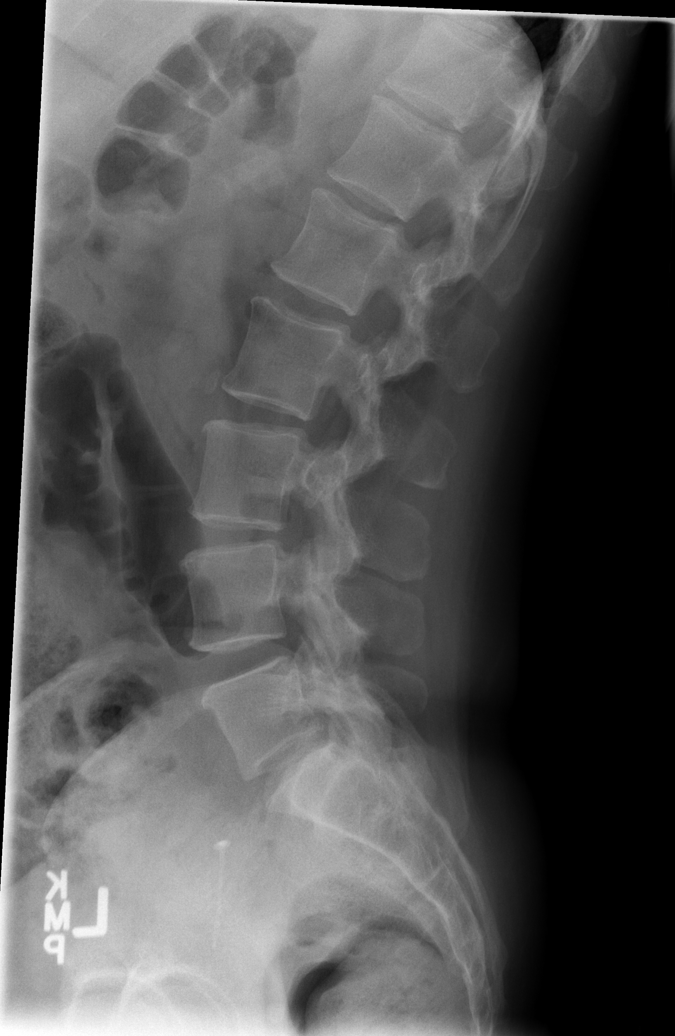

[t l-spine l5-s1 spot]
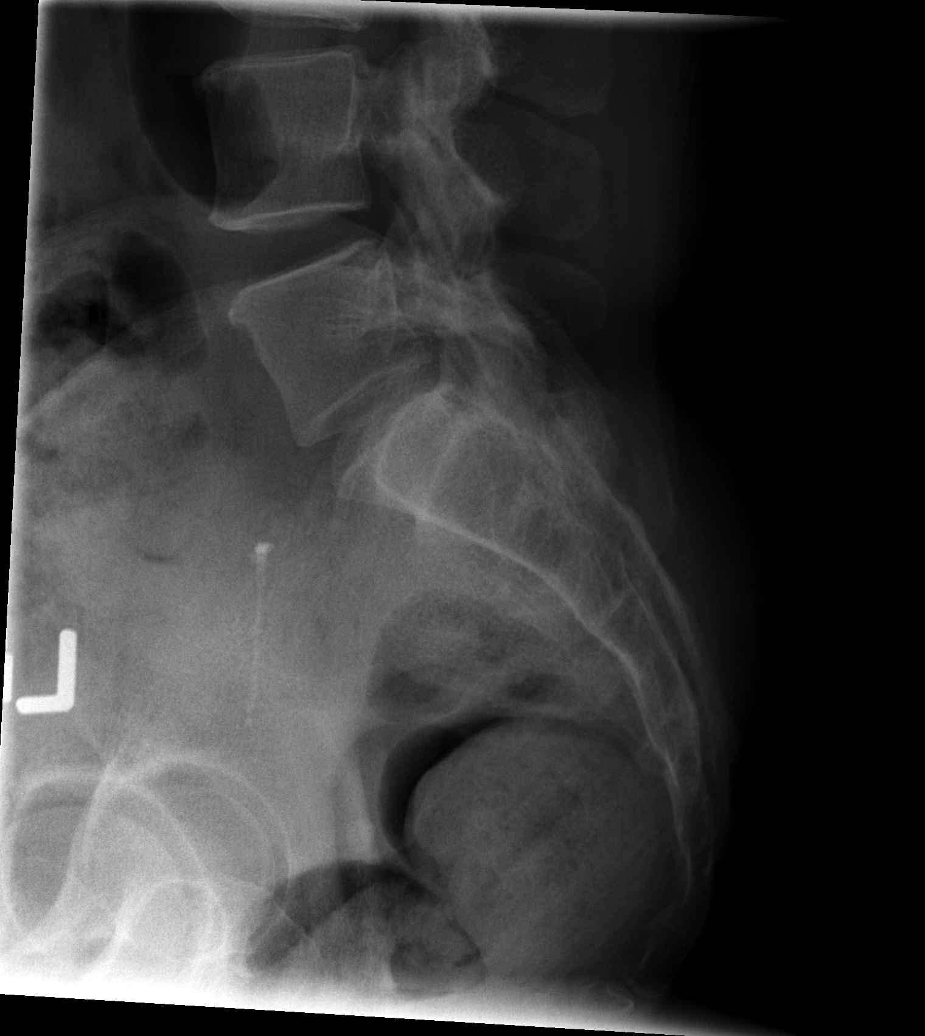

[5 of 5 positions shown; findings below may reference images not displayed]

chest x-ray December 02, 2014 for purposes of numbering of the vertebral levels. S1
appears to be transitional
FINDINGS: There is chronic dextrocurvature of the lumbar spine with a minimal
rotatory component. The degree of angulation is at least 10 degrees.
S1 is transitional. The vertebral bodies are preserved in height.
There is no high-grade disc space narrowing. There is no
spondylolisthesis. There is no significant facet joint hypertrophy.
The pedicles are intact where visualized. The observed portions of
the sacrum exhibit no acute abnormalities.
IMPRESSION: Known dextrocurvature of the lumbar spine. This is a part of a
thoracolumbar curvature that is reverse S-shaped. There is no
compression fracture or spondylolisthesis nor significant disc space
narrowing.

Given the patient's left foot drop, MRI of the lumbar spine may be
useful.

## 2016-12-26 DIAGNOSIS — J019 Acute sinusitis, unspecified: Secondary | ICD-10-CM | POA: Diagnosis not present

## 2016-12-26 DIAGNOSIS — K208 Other esophagitis: Secondary | ICD-10-CM | POA: Diagnosis not present

## 2016-12-26 DIAGNOSIS — R070 Pain in throat: Secondary | ICD-10-CM | POA: Diagnosis not present

## 2017-01-16 DIAGNOSIS — F3181 Bipolar II disorder: Secondary | ICD-10-CM | POA: Diagnosis not present

## 2017-01-22 DIAGNOSIS — F3181 Bipolar II disorder: Secondary | ICD-10-CM | POA: Diagnosis not present

## 2017-01-26 DIAGNOSIS — F3181 Bipolar II disorder: Secondary | ICD-10-CM | POA: Diagnosis not present

## 2017-02-06 DIAGNOSIS — Z01419 Encounter for gynecological examination (general) (routine) without abnormal findings: Secondary | ICD-10-CM | POA: Diagnosis not present

## 2017-02-11 ENCOUNTER — Encounter: Payer: Self-pay | Admitting: Family Medicine

## 2017-02-15 ENCOUNTER — Other Ambulatory Visit: Payer: Self-pay | Admitting: Family Medicine

## 2017-02-15 DIAGNOSIS — E119 Type 2 diabetes mellitus without complications: Secondary | ICD-10-CM

## 2017-02-15 MED ORDER — GLUCOSE BLOOD VI STRP: ORAL_STRIP | 3 refills | Status: DC

## 2017-02-28 LAB — HM DIABETES EYE EXAM

## 2017-03-02 ENCOUNTER — Encounter: Payer: Self-pay | Admitting: Family Medicine

## 2017-03-03 ENCOUNTER — Encounter: Payer: Self-pay | Admitting: Family Medicine

## 2017-03-13 DIAGNOSIS — H6981 Other specified disorders of Eustachian tube, right ear: Secondary | ICD-10-CM | POA: Diagnosis not present

## 2017-03-13 DIAGNOSIS — H9193 Unspecified hearing loss, bilateral: Secondary | ICD-10-CM | POA: Diagnosis not present

## 2017-03-20 ENCOUNTER — Ambulatory Visit (INDEPENDENT_AMBULATORY_CARE_PROVIDER_SITE_OTHER): Payer: 59 | Admitting: Family Medicine

## 2017-03-20 ENCOUNTER — Ambulatory Visit (INDEPENDENT_AMBULATORY_CARE_PROVIDER_SITE_OTHER): Payer: 59

## 2017-03-20 ENCOUNTER — Encounter: Payer: Self-pay | Admitting: Family Medicine

## 2017-03-20 ENCOUNTER — Telehealth: Payer: Self-pay | Admitting: Family Medicine

## 2017-03-20 VITALS — BP 118/80 | HR 88 | Temp 98.3°F | Ht 64.0 in | Wt 145.2 lb

## 2017-03-20 DIAGNOSIS — M25562 Pain in left knee: Secondary | ICD-10-CM | POA: Diagnosis not present

## 2017-03-20 NOTE — Telephone Encounter (Signed)
Patient is seeing Dr. Jimmey RalphParker today due to her schedule at 4pm for pain and stiffness in L knee

## 2017-03-20 NOTE — Patient Instructions (Signed)
Use the knee immobilizer. Use ibuprofen or naproxen for your pain.   Come back on Tuesday to see Dr Berline Choughigby.  Take care,  Dr Jimmey RalphParker

## 2017-03-20 NOTE — Progress Notes (Signed)
    Subjective:  Selena Holt is a 46 y.o. female who presents today with a chief complaint of Left Knee Pain  HPI:  Left Knee Pain Symptoms started 2 days ago. Joint is very stiff and she has some pain that is located in the front of the knee. Tried to walk up steps at work but had significant pain. Last week, felt like it buckled while going down steps. No other obvious precipitating events. Tried ibuprofen which helps. Feels like "there is something in there." She has had mild knee pain since starting yoga a few months ago. No prior history of injury or trauma. No fevers or chills.   ROS: Per HPI  PMH: Smoking history reviewed. Never smoker.   Objective:  Physical Exam: BP 118/80 (BP Location: Left Arm, Patient Position: Sitting, Cuff Size: Normal)   Pulse 88   Temp 98.3 F (36.8 C) (Oral)   Ht 5\' 4"  (1.626 m)   Wt 145 lb 4 oz (65.9 kg)   SpO2 99%   BMI 24.93 kg/m   Gen: NAD, resting comfortably MSK:  -Left Knee: No deformities. Patella is midline. No appreciable effusions. Very limited range of motion. Knee is in full extension with about 10-15 degrees of flexion. Lachman negative. Anterior and posterior drawer tests not able to be performed. Pain with flexion while weight bearing.   Imaging: Left Knee Standing AP and lateral: No obvious abnormalities.  Assessment/Plan:  Left Knee Pain Concern for meniscal tear of her left knee with a locked knee. Plain film did not reveal any significant abnormalities. Discussed case the Sports Medicine Physician, Dr Berline Choughigby. Offered treatment options to patient including trial of injection vs watchful waiting. Patient deferred injection for today. Patient placed in knee immobilizer today. Recommended rest, elevation, and NSAIDs. Will follow up with Dr Berline Choughigby in 5 days. Will likely need injection at that time if not improved. May ultimately need orthopedic referral if symptoms do not improve with conservative therapy and steroid injections.    Time Spent: I spent 30 minutes face-to-face with the patient, with more than half spent on counseling for the above issues and coordinating care.  Katina Degreealeb M. Jimmey RalphParker, MD 03/20/2017 4:59 PM

## 2017-03-24 ENCOUNTER — Ambulatory Visit: Payer: 59 | Admitting: Sports Medicine

## 2017-05-01 ENCOUNTER — Ambulatory Visit: Payer: BLUE CROSS/BLUE SHIELD | Admitting: Family Medicine

## 2017-05-28 ENCOUNTER — Ambulatory Visit: Payer: Self-pay | Admitting: Family Medicine

## 2017-06-23 ENCOUNTER — Encounter: Payer: Self-pay | Admitting: Family Medicine

## 2017-06-23 ENCOUNTER — Ambulatory Visit: Payer: BC Managed Care – PPO | Admitting: Family Medicine

## 2017-06-23 VITALS — BP 141/94 | HR 97 | Temp 98.3°F | Ht 64.0 in | Wt 148.4 lb

## 2017-06-23 DIAGNOSIS — M791 Myalgia, unspecified site: Secondary | ICD-10-CM

## 2017-06-23 DIAGNOSIS — R05 Cough: Secondary | ICD-10-CM | POA: Diagnosis not present

## 2017-06-23 DIAGNOSIS — R059 Cough, unspecified: Secondary | ICD-10-CM

## 2017-06-23 LAB — POCT INFLUENZA A/B
Influenza A, POC: NEGATIVE
Influenza B, POC: NEGATIVE

## 2017-06-23 MED ORDER — AZITHROMYCIN 250 MG PO TABS
ORAL_TABLET | ORAL | 0 refills | Status: DC
Start: 2017-06-23 — End: 2017-08-26

## 2017-06-23 MED ORDER — METHYLPREDNISOLONE ACETATE 80 MG/ML IJ SUSP
80.0000 mg | Freq: Once | INTRAMUSCULAR | Status: AC
  Administered 2017-06-23: 80 mg via INTRAMUSCULAR

## 2017-06-23 MED ORDER — IPRATROPIUM BROMIDE 0.06 % NA SOLN: 2.0000 | Freq: Four times a day (QID) | NASAL | 0 refills | Status: DC

## 2017-06-23 NOTE — Addendum Note (Signed)
Addended by: Tempie HoistMCNEIL, AUTUMN M on: 06/23/2017 04:56 PM   Modules accepted: Orders

## 2017-06-23 NOTE — Progress Notes (Signed)
    Subjective:  Selena Holt is a 46 y.o. female who presents today with a chief complaint of cough .   HPI:  Cough, Acute Issue Started 4-5 days ago. Worsened over that time.  Associated symptoms include myalgias, and subjective fevers, sore throat, rhinorrhea, chills, and fatigue.  Tried several over-the-counter medications including Zyrtec, Flonase, and ibuprofen.  Thinks that this is helped a little bit with her fever and drainage.  No high fever 102 F.  Did not get a flu shot this year.  No known sick contacts.  ROS: Per HPI  PMH: Smoking history reviewed.  Never smoker.  Objective:  Physical Exam: BP (!) 141/94   Pulse 97   Temp 98.3 F (36.8 C) (Oral)   Ht 5\' 4"  (1.626 m)   Wt 148 lb 6.4 oz (67.3 kg)   SpO2 100%   BMI 25.47 kg/m   Gen: NAD, resting comfortably HEENT: Right TM with clear effusion.  Left TM clear.  Maxillary sinuses clear translocation bilaterally.  Oropharynx erythematous without exudate.  Moist mucous membranes.  No lymphadenopathy. CV: RRR with no murmurs appreciated Pulm: NWOB, CTAB with no crackles, wheezes, or rhonchi  Rapid flu negative  Assessment/Plan:  Cough No signs of bacterial infection.  Start Atrovent nasal spray.  IM Depo-Medrol given today.  Send in a "pocket prescription" for azithromycin with strict instruction to not start unless symptoms worsen or do not improve the next 3-5 days.  Encouraged continued use of ibuprofen and/or Tylenol as needed for low-grade fever and pain.  Return precautions reviewed.  Follow-up as needed.  Katina Degreealeb M. Jimmey RalphParker, MD 06/23/2017 4:52 PM

## 2017-06-23 NOTE — Patient Instructions (Signed)
Start the atrovent. Stop flonase.  Start azithromycin if symptoms are not improving in a few days.  Take care,  Dr Jimmey RalphParker

## 2017-07-15 ENCOUNTER — Ambulatory Visit (INDEPENDENT_AMBULATORY_CARE_PROVIDER_SITE_OTHER): Payer: BC Managed Care – PPO

## 2017-07-15 DIAGNOSIS — Z23 Encounter for immunization: Secondary | ICD-10-CM

## 2017-08-26 ENCOUNTER — Encounter: Payer: Self-pay | Admitting: Family Medicine

## 2017-08-26 ENCOUNTER — Ambulatory Visit: Payer: BC Managed Care – PPO | Admitting: Family Medicine

## 2017-08-26 VITALS — BP 124/78 | HR 95 | Temp 98.3°F | Ht 64.0 in | Wt 145.8 lb

## 2017-08-26 DIAGNOSIS — R200 Anesthesia of skin: Secondary | ICD-10-CM | POA: Diagnosis not present

## 2017-08-26 DIAGNOSIS — E119 Type 2 diabetes mellitus without complications: Secondary | ICD-10-CM | POA: Diagnosis not present

## 2017-08-26 LAB — MICROALBUMIN / CREATININE URINE RATIO
Creatinine,U: 23.5 mg/dL
Microalb Creat Ratio: 4.9 mg/g (ref 0.0–30.0)
Microalb, Ur: 1.1 mg/dL (ref 0.0–1.9)

## 2017-08-26 LAB — POCT GLYCOSYLATED HEMOGLOBIN (HGB A1C): Hemoglobin A1C: 6.2

## 2017-08-26 NOTE — Assessment & Plan Note (Signed)
A1c slightly worsened to 6.2.  We will not start any additional medications.  We will also not increase her dose of metformin.  Discussed lifestyle modifications.  She would like referral to nutritionist.  [name]  Also discussed regular exercise.  She will follow-up with me in 6-12 months, or sooner as needed.  Foot exam today was normal.  We will check urine microalbumin.

## 2017-08-26 NOTE — Progress Notes (Signed)
    Subjective:  Selena Holt is a 47 y.o. female who presents today with a chief complaint of T2DM.   HPI:  T2DM, established problem, Worsening Currently on metformin 500 mg daily.  Tolerates this dose well without side effects.  Over the past several weeks she is concerned that her blood sugars have been elevated.  Fasting levels usually in the 150-180 range.  She notes it will go up into the 280s occasionally after eating.  Occasional polyuria.  Occasional polydipsia.    Also reports that she has had finger numbness and tingling when exposed to cold weather.  ROS: Per HPI  Objective:  Physical Exam: BP 124/78 (BP Location: Left Arm, Patient Position: Sitting, Cuff Size: Normal)   Pulse 95   Temp 98.3 F (36.8 C) (Oral)   Ht 5\' 4"  (1.626 m)   Wt 145 lb 12.8 oz (66.1 kg)   SpO2 99%   BMI 25.03 kg/m   Gen: NAD, resting comfortably CV: RRR with no murmurs appreciated Pulm: NWOB, CTAB with no crackles, wheezes, or rhonchi MSK: Foot exam performed today.  No abnormalities.  Hemoglobin A1c 6.2  Assessment/Plan:  Diabetes type 2, controlled A1c slightly worsened to 6.2.  We will not start any additional medications.  We will also not increase her dose of metformin.  Discussed lifestyle modifications.  She would like referral to nutritionist.  [facility]  Also discussed regular exercise.  She will follow-up with me in 6-12 months, or sooner as needed.  Foot exam today was normal.  We will check urine microalbumin.  Bilateral finger numbness Given that symptoms are intermittent in nature and only occur with cold exposure, doubt that this is related to diabetic neuropathy.  Additionally her sugars have typically been well controlled.  She may have mild Raunaud's phenomenon.  Advised patient to avoid cold exposure to see if this helps with her symptoms.  Katina Degreealeb M. Jimmey RalphParker, MD 08/26/2017 11:58 AM

## 2017-08-26 NOTE — Patient Instructions (Signed)
Your A1c today is 6.2.  We do not need to make any medication changes.  [facility]  Please come back to see me in 6-12 months, or sooner as needed.  Take care, Dr. Jimmey RalphParker  Diet Recommendations for Diabetes   Starchy (carb) foods: Bread, rice, pasta, potatoes, corn, cereal, grits, crackers, bagels, muffins, all baked goods.  (Fruits, milk, and yogurt also have carbohydrate, but most of these foods will not spike your blood sugar as the starchy foods will.)  A few fruits do cause high blood sugars; use small portions of bananas (limit to 1/2 at a time), grapes, watermelon, oranges, and most tropical fruits.    Protein foods: Meat, fish, poultry, eggs, dairy foods, and beans such as pinto and kidney beans (beans also provide carbohydrate).   1. Eat at least 3 meals and 1-2 snacks per day. Never go more than 4-5 hours while awake without eating. Eat breakfast within the first hour of getting up.   2. Limit starchy foods to TWO per meal and ONE per snack. ONE portion of a starchy  food is equal to the following:   - ONE slice of bread (or its equivalent, such as half of a hamburger bun).   - 1/2 cup of a "scoopable" starchy food such as potatoes or rice.   - 15 grams of carbohydrate as shown on food label.  3. Include at every meal: a protein food, a carb food, and vegetables and/or fruit.   - Obtain twice the volume of veg's as protein or carbohydrate foods for both lunch and dinner.   - Fresh or frozen veg's are best.   - Keep frozen veg's on hand for a quick vegetable serving.

## 2017-08-26 NOTE — Assessment & Plan Note (Signed)
Given that symptoms are intermittent in nature and only occur with cold exposure, doubt that this is related to diabetic neuropathy.  Additionally her sugars have typically been well controlled.  She may have mild Raunaud's phenomenon.  Advised patient to avoid cold exposure to see if this helps with her symptoms.

## 2017-08-27 ENCOUNTER — Telehealth: Payer: Self-pay | Admitting: Family Medicine

## 2017-08-27 NOTE — Progress Notes (Signed)
Dr Lavone NeriParker's interpretation of your lab work:  Your urine test was NORMAL. You do not have any significant amount of protein in your urine. We do not need to make any changes. To your treatment plan.   If you have any additional questions, please give us a call or send us a message through [facility].  Take care, Dr Jimmey RalphParker

## 2017-08-27 NOTE — Telephone Encounter (Signed)
Please advise.   Copied from CRM [phone]. Topic: Referral - Question >> Aug 27, 2017 12:58 PM Terisa Starraylor, Brittany L wrote: Patient wants to know if you have to have a referral for the wellness program at [facility]. She was told about this program yesterday at her dr appt. Call back is [phone]

## 2017-08-28 NOTE — Telephone Encounter (Signed)
Referral [facility]

## 2017-08-28 NOTE — Telephone Encounter (Signed)
Yes, referral is needed.  Left voicemail letting the patient know I will fill out referral form and [facility] on her behalf and they will contact her to set her up for the program.

## 2017-09-18 ENCOUNTER — Other Ambulatory Visit (HOSPITAL_COMMUNITY)
Admission: RE | Admit: 2017-09-18 | Discharge: 2017-09-18 | Disposition: A | Discharge status: Home / Self Care | Payer: BC Managed Care – PPO | Source: Ambulatory Visit | Attending: Family Medicine | Admitting: Family Medicine

## 2017-09-18 ENCOUNTER — Ambulatory Visit: Payer: Self-pay | Admitting: *Deleted

## 2017-09-18 ENCOUNTER — Ambulatory Visit: Payer: BC Managed Care – PPO | Admitting: Family Medicine

## 2017-09-18 ENCOUNTER — Encounter: Payer: Self-pay | Admitting: Family Medicine

## 2017-09-18 VITALS — BP 118/68 | HR 78 | Temp 98.5°F | Ht 64.0 in | Wt 144.0 lb

## 2017-09-18 DIAGNOSIS — N912 Amenorrhea, unspecified: Secondary | ICD-10-CM

## 2017-09-18 DIAGNOSIS — N9089 Other specified noninflammatory disorders of vulva and perineum: Secondary | ICD-10-CM

## 2017-09-18 DIAGNOSIS — M545 Low back pain, unspecified: Secondary | ICD-10-CM

## 2017-09-18 DIAGNOSIS — M549 Dorsalgia, unspecified: Secondary | ICD-10-CM | POA: Diagnosis not present

## 2017-09-18 DIAGNOSIS — R1031 Right lower quadrant pain: Secondary | ICD-10-CM

## 2017-09-18 LAB — POCT URINALYSIS DIPSTICK
Bilirubin, UA: NEGATIVE
Blood, UA: NEGATIVE
Glucose, UA: NEGATIVE
Ketones, UA: NEGATIVE
Leukocytes, UA: NEGATIVE
Nitrite, UA: NEGATIVE
Protein, UA: NEGATIVE
Spec Grav, UA: 1.01 (ref 1.010–1.025)
Urobilinogen, UA: 0.2 E.U./dL
pH, UA: 6 (ref 5.0–8.0)

## 2017-09-18 LAB — POCT URINE PREGNANCY: Preg Test, Ur: NEGATIVE

## 2017-09-18 MED ORDER — DICLOFENAC SODIUM 75 MG PO TBEC: 75.0000 mg | DELAYED_RELEASE_TABLET | Freq: Two times a day (BID) | ORAL | 0 refills | Status: DC

## 2017-09-18 MED ORDER — KETOROLAC TROMETHAMINE 60 MG/2ML IM SOLN
60.0000 mg | Freq: Once | INTRAMUSCULAR | Status: AC
  Administered 2017-09-18: 60 mg via INTRAMUSCULAR

## 2017-09-18 NOTE — Progress Notes (Signed)
r 

## 2017-09-18 NOTE — Patient Instructions (Signed)
We will get an ultrasound to make sure you are not having a cyst or fibroids.  Please use the anti-inflammatory starting tomorrow.  Take care, Dr Jimmey RalphParker

## 2017-09-18 NOTE — Progress Notes (Addendum)
Subjective:  Selena Holt is a 47 y.o. female who presents today for same-day appointment with a chief complaint of back pain.   HPI:  Back Pain, Acute Issue Started 2 days ago.  Stable over that time.  No history of trauma, fall, or other obvious precipitating event.  She is also noticed some right lower quadrant abdominal pain and groin "pressure" over the last day.  She has had a little vaginal discharge which is normal for her.  No dysuria.  Urin which has not helped significantly.  No constipation or diarrhea.  E has been a little darker, though denies any hematuria.  No fevers or chills.  She tries ibuprofen no nausea or vomiting.  She has not noticed anything else that aggravates or alleviates her symptoms.  She has a history of kidney stones.  ROS: Per HPI  PMH: She reports that  has never smoked. she has never used smokeless tobacco. She reports that she does not drink alcohol or use drugs.  Objective:  Physical Exam: BP 118/68 (BP Location: Left Arm, Patient Position: Sitting, Cuff Size: Normal)   Pulse 78   Temp 98.5 F (36.9 C) (Oral)   Ht 5\' 4"  (1.626 m)   Wt 144 lb (65.3 kg)   SpO2 98%   BMI 24.72 kg/m   Gen: NAD, resting comfortably CV: RRR with no murmurs appreciated Pulm: NWOB, CTAB with no crackles, wheezes, or rhonchi GI: Normal bowel sounds present. Soft, Nontender, Nondistended. GU: Approximately 1 cm pedunculated skin tag on right vulva, otherwise normal external female genitalia.  Vaginal vault normal without abnormalities. Chaperone present for exam. MSK:  -Back: No deformities.  Full range of motion.  Tender to palpation along right lower lumbar area. - Lower extremity's: No deformities.  Strength 5 out of 5 throughout.  Patellar reflexes 2+ and symmetric bilaterally.   Results for orders placed or performed in visit on 09/18/17 (from the past 24 hour(s))  POCT urinalysis dipstick     Status: None   Collection Time: 09/18/17 10:57 AM  Result Value  Ref Range   Color, UA Yellow    Clarity, UA Clear    Glucose, UA Negative    Bilirubin, UA Negative    Ketones, UA Negative    Spec Grav, UA 1.010 1.010 - 1.025   Blood, UA Negative    pH, UA 6.0 5.0 - 8.0   Protein, UA Negative    Urobilinogen, UA 0.2 0.2 or 1.0 E.U./dL   Nitrite, UA Negative    Leukocytes, UA Negative Negative   Appearance     Odor    POCT urine pregnancy     Status: Normal   Collection Time: 09/18/17 11:48 AM  Result Value Ref Range   Preg Test, Ur Negative Negative   Assessment/Plan:  Back Pain/RLQ Pain Broad differential.  She does not have any red flag signs or symptoms today.  Her abdominal exam and pelvic exam are benign.  Her UA does not have any signs of nephrolithiasis or infection and her pregnancy test is negative - doubt acute intra-abdominal pathology.  Differential includes right ovarian cyst, fibroid uterus, malpositioned IUD, and musculoskeletal back pain.  We will check stat pelvic ultrasound to rule out cyst, fibroids, and IUD malpositioning. Check GC/CT/BV/YV. Will give 60 mg IM Toradol today for her pain.  Sent in a prescription for diclofenac 75 mg 3 times daily as needed. Return precautions reviewed.   Vulvar skin tag Recommended pt to follow up with OBGYN for  removal.   Katina Degree. [name], MD 09/18/2017 12:16 PM

## 2017-09-18 NOTE — Addendum Note (Signed)
Addended by: Ardith DarkPARKER, CALEB M on: 09/18/2017 04:32 PM   Modules accepted: Orders

## 2017-09-18 NOTE — Telephone Encounter (Signed)
Patient states she has had back pain for 2 days- she states the pain is getting worse. Patient is rating her pain at a 6 now. Advised patient to keep her appointment- but if she should start to deteriorate- go on to ED.   Reason for Disposition . [1] SEVERE back pain (e.g., excruciating, unable to do any normal activities) AND [2] not improved 2 hours after pain medicine  Answer Assessment - Initial Assessment Questions 1. ONSET: "When did the pain begin?"      2 days ago 2. LOCATION: "Where does it hurt?" (upper, mid or lower back)     Lower back- right side 3. SEVERITY: "How bad is the pain?"  (e.g., Scale 1-10; mild, moderate, or severe)   - MILD (1-3): doesn't interfere with normal activities    - MODERATE (4-7): interferes with normal activities or awakens from sleep    - SEVERE (8-10): excruciating pain, unable to do any normal activities      6 4. PATTERN: "Is the pain constant?" (e.g., yes, no; constant, intermittent)      Constant now 5. RADIATION: "Does the pain shoot into your legs or elsewhere?"     no 6. CAUSE:  "What do you think is causing the back pain?"      Kidney stone- possible- urine is darker 7. BACK OVERUSE:  "Any recent lifting of heavy objects, strenuous work or exercise?"     no 8. MEDICATIONS: "What have you taken so far for the pain?" (e.g., nothing, acetaminophen, NSAIDS)     Ibuprofen- normal dosing 9. NEUROLOGIC SYMPTOMS: "Do you have any weakness, numbness, or problems with bowel/bladder control?"     No, patient does feel pressure- almost like she wants to "push" 10. OTHER SYMPTOMS: "Do you have any other symptoms?" (e.g., fever, abdominal pain, burning with urination, blood in urine)       no 11. PREGNANCY: "Is there any chance you are pregnant?" (e.g., yes, no; LMP)       No- IUD  Protocols used: BACK PAIN-A-AH

## 2017-09-18 NOTE — Telephone Encounter (Signed)
Noted.  Patient in office now. 

## 2017-09-18 NOTE — Telephone Encounter (Signed)
See note

## 2017-09-22 ENCOUNTER — Other Ambulatory Visit: Payer: Self-pay

## 2017-09-22 LAB — CERVICOVAGINAL ANCILLARY ONLY
Bacterial vaginitis: NEGATIVE
Candida vaginitis: NEGATIVE
Chlamydia: NEGATIVE
Neisseria Gonorrhea: NEGATIVE
Trichomonas: NEGATIVE

## 2017-09-23 ENCOUNTER — Telehealth: Payer: Self-pay | Admitting: Family Medicine

## 2017-09-23 ENCOUNTER — Other Ambulatory Visit: Payer: Self-pay

## 2017-09-23 DIAGNOSIS — E119 Type 2 diabetes mellitus without complications: Secondary | ICD-10-CM

## 2017-09-23 MED ORDER — METFORMIN HCL 500 MG PO TABS: 500.0000 mg | ORAL_TABLET | Freq: Two times a day (BID) | ORAL | 0 refills | Status: DC

## 2017-09-23 NOTE — Telephone Encounter (Signed)
Previous prescription for Metformin 500mg  is expired and was written by Dr. Claiborne BillingsKuneff.   LOV addressing DM on 08/26/17  Dr. Jimmey RalphParker  [facility]   [address]

## 2017-09-23 NOTE — Telephone Encounter (Signed)
Copied from CRM [phone]. Topic: Quick Communication - Rx Refill/Question >> Sep 23, 2017  9:16 AM Raquel SarnaHayes, Teresa G wrote: Metformin 500 mg  [facility]  [id number] [place], [place] - [address] [place] [address] Phone: [phone] Fax: [phone]

## 2017-09-23 NOTE — Telephone Encounter (Signed)
See note

## 2017-09-24 NOTE — Telephone Encounter (Signed)
Rx was sent to pharmacy on 09/23/17 @1 :32 pm.

## 2017-11-09 NOTE — Progress Notes (Signed)
[facility] Progress Report   Patient Details  Name: Selena Holt MRN: [id number] Date of Birth: 03/14/1971 Age: 47 y.o. PCP: Ardith DarkParker, Caleb M, MD  Vitals:   11/09/17 1339  BP: 130/78  Pulse: 90  Resp: 18  SpO2: 98%  Weight: 144 lb (65.3 kg)  Height: 5\' 4"  (1.626 m)     [name] Eval - 11/09/17 1300      Referral    Referring Provider  Dr. Jimmey RalphParker    Reason for referral  Diabetes;Inactivity;Orthopedic;Other    Program Start Date  11/09/17 scolosis/kyphosis   scolosis/kyphosis     Measurement   Waist Circumference  34 inches    Hip Circumference  37 inches    Body fat  33.1 percent      Information for Trainer   Goals  "Increase strength, increase cardio, decrease diabetic"    Current Exercise  none    Orthopedic Concerns  B knees, back    Pertinent Medical History  DM2, Scolosis, kyphosis    Current Barriers  none      Mobility and Daily Activities   I find it easy to walk up or down two or more flights of stairs.  2    I have no trouble taking out the trash.  4    I do housework such as vacuuming and dusting on my own without difficulty.  4    I can easily lift a gallon of milk (8lbs).  4    I can easily walk a mile.  2    I have no trouble reaching into high cupboards or reaching down to pick up something from the floor.  4    I do not have trouble doing out-door work such as Loss adjuster, charteredmoving the lawn, raking leaves, or gardening.  2      Mobility and Daily Activities   I feel younger than my age.  1    I feel independent.  4    I feel energetic.  2    I live an active life.   1    I feel strong.  1    I feel healthy.  1    I feel active as other people my age.  1      How fit and strong are you.   Fit and Strong Total Score  33      Past Medical History:  Diagnosis Date  . Anemia   . Bipolar disorder (HCC)   . Collapsed lung    "spontaneous" , teenager  . Diabetes mellitus without complication (HCC)   . HSV-1 (herpes simplex virus 1) infection    . Hypertension   . Kidney stones   . Scoliosis (and kyphoscoliosis), idiopathic    Past Surgical History:  Procedure Laterality Date  . 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  She will begin her twice weekly classes from 6-7pm tonight.   [name] FillersDebbie Jushua Waltman 11/09/2017, 1:43 PM

## 2017-11-19 DIAGNOSIS — Z6824 Body mass index (BMI) 24.0-24.9, adult: Secondary | ICD-10-CM | POA: Diagnosis not present

## 2017-11-19 DIAGNOSIS — R102 Pelvic and perineal pain: Secondary | ICD-10-CM | POA: Diagnosis not present

## 2017-11-20 ENCOUNTER — Other Ambulatory Visit: Payer: Self-pay | Admitting: Obstetrics and Gynecology

## 2017-11-20 DIAGNOSIS — N644 Mastodynia: Secondary | ICD-10-CM

## 2017-11-23 ENCOUNTER — Other Ambulatory Visit: Payer: Self-pay | Admitting: Obstetrics and Gynecology

## 2017-11-23 ENCOUNTER — Ambulatory Visit
Admission: RE | Admit: 2017-11-23 | Discharge: 2017-11-23 | Disposition: A | Discharge status: Home / Self Care | Payer: BC Managed Care – PPO | Source: Ambulatory Visit | Attending: Obstetrics and Gynecology | Admitting: Obstetrics and Gynecology

## 2017-11-23 DIAGNOSIS — R922 Inconclusive mammogram: Secondary | ICD-10-CM | POA: Diagnosis not present

## 2017-11-23 DIAGNOSIS — N6310 Unspecified lump in the right breast, unspecified quadrant: Secondary | ICD-10-CM

## 2017-11-23 DIAGNOSIS — N6001 Solitary cyst of right breast: Secondary | ICD-10-CM | POA: Diagnosis not present

## 2017-11-23 DIAGNOSIS — N644 Mastodynia: Secondary | ICD-10-CM | POA: Diagnosis not present

## 2017-11-26 ENCOUNTER — Encounter: Payer: Self-pay | Admitting: Physician Assistant

## 2017-11-26 ENCOUNTER — Ambulatory Visit: Payer: BC Managed Care – PPO | Admitting: Physician Assistant

## 2017-11-26 VITALS — BP 126/80 | HR 92 | Temp 98.8°F | Ht 64.0 in | Wt 145.0 lb

## 2017-11-26 DIAGNOSIS — N644 Mastodynia: Secondary | ICD-10-CM | POA: Diagnosis not present

## 2017-11-26 DIAGNOSIS — R232 Flushing: Secondary | ICD-10-CM | POA: Diagnosis not present

## 2017-11-26 LAB — CBC
HCT: 36.1 % (ref 36.0–46.0)
Hemoglobin: 12.6 g/dL (ref 12.0–15.0)
MCHC: 34.9 g/dL (ref 30.0–36.0)
MCV: 93.1 fl (ref 78.0–100.0)
Platelets: 252 10*3/uL (ref 150.0–400.0)
RBC: 3.88 Mil/uL (ref 3.87–5.11)
RDW: 13.6 % (ref 11.5–15.5)
WBC: 10 10*3/uL (ref 4.0–10.5)

## 2017-11-26 LAB — TSH: TSH: 2.25 u[IU]/mL (ref 0.35–4.50)

## 2017-11-26 LAB — LUTEINIZING HORMONE: LH: 1.73 m[IU]/mL

## 2017-11-26 LAB — FOLLICLE STIMULATING HORMONE: FSH: 5.8 m[IU]/mL

## 2017-11-26 NOTE — Patient Instructions (Addendum)
It was great to see you!   We will call you with your lab results.   

## 2017-11-26 NOTE — Progress Notes (Signed)
Selena Holt is a 47 y.o. female here for a new problem.  I acted as a Neurosurgeon for [employer], PA-C Corky Mull, LPN  History of Present Illness:   Chief Complaint  Patient presents with  . Breast Pain  . Menopause symptoms    Pt here today to discuss bilateral breast pain since February. Pt says breasts feel engorged. No nipple discharge. She went to her Ob-Gyn a few weeks ago and told them she had breast pain, a cyst was palpated and she then had Mammogram and Ultrasound done 5/6 results in chart and pt is aware. Has benign R breast cyst. Continues to have breast pain symptoms around the 22nd - 8th of each month. Feels like her breasts are engorged during this time. Caffeine intake includes diet coke in AM and PM. Doesn't do chocolate. Doesn't drink coffee. She states that the radiologist reviewed her mammogram results with her and suggested that her pain was possibly hormone related. She does take ibuprofen for breast pain however it only provides temporary relief.  Pt also here to discuss menopause. She is having night sweats started in Feb and getting worse and is now having hot flashes during the day. Having forgetfulness. Not sexually active at present. Normally very cold during the day and now having night sweats. Has an IUD that has been in for 4 years. This is her 3rd IUD. She does not get periods. Weight is stable. Denies changes in appetite or mood. Does have an ob-gyn.  Wt Readings from Last 4 Encounters: 11/26/17 : 145 lb (65.8 kg) 11/09/17 : 144 lb (65.3 kg) 09/18/17 : 144 lb (65.3 kg) 08/26/17 : 145 lb 12.8 oz (66.1 kg)        Past Medical History:  Diagnosis Date  . Anemia   . Bipolar disorder (HCC)   . Collapsed lung    "spontaneous" , teenager  . Diabetes mellitus without complication (HCC)   . HSV-1 (herpes simplex virus 1) infection   . Hypertension   . Kidney stones   . Scoliosis (and kyphoscoliosis), idiopathic      Social History    Socioeconomic History  . Marital status: Single    Spouse name: Not on file  . Number of children: 2  . Years of education: Not on file  . Highest education level: Not on file  Occupational History  . 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 Financial resource strain: Not on file  . Food insecurity:    Worry: Not on file    Inability: Not on file  . Transportation needs:    Medical: Not on file    Non-medical: Not on file  Tobacco Use  . Smoking status: Never Smoker  . Smokeless tobacco: Never Used  Substance and Sexual Activity  . Alcohol use: No    Alcohol/week: 0.0 oz    Comment: occ  . Drug use: No  . Sexual activity: Never  Lifestyle  . Physical activity:    Days per week: Not on file    Minutes per session: Not on file  . Stress: Not on file  Relationships  . Social connections:    Talks on phone: Not on file    Gets together: Not on file    Attends religious service: Not on file    Active member of club or organization: Not on file    Attends meetings of clubs or organizations: Not on  file    Relationship status: Not on file  . Intimate partner violence:    Fear of current or ex partner: Not on file    Emotionally abused: Not on file    Physically abused: Not on file    Forced sexual activity: Not on file  Other Topics Concern  . Not on file  Social History Narrative   She lives with her grown son. 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    Past Surgical History:  Procedure Laterality Date  . CHEST TUBE INSERTION      Family History  Problem Relation Age of Onset  . Hyperlipidemia Mother   . Hypertension Mother   . Hyperlipidemia Father   . Hypertension Father   . CAD Father   . Heart disease Father   . Alzheimer's disease Maternal Grandmother 50    Allergies  Allergen Reactions  . Tetanus Toxoids Anaphylaxis    tetanus  . Ceclor [Cefaclor] Hives  . Doxycycline Other (See  Comments)    Hives  . Penicillins Hives  . Sulfa Antibiotics Hives  . Lisinopril Other (See Comments)    fatigue    Current Medications:   Current Outpatient Medications:  .  cetirizine (ZYRTEC) 10 MG tablet, Take 10 mg by mouth daily., Disp: , Rfl:  .  Cholecalciferol (D3 MAXIMUM STRENGTH) 5000 UNIT/ML LIQD, Take 1 mL (5,000 Units total) by mouth daily with supper. (Patient taking differently: Take 5,000 Units by mouth. Pt only taking one to two times a week), Disp: 30 mL, Rfl: 2 .  Cyanocobalamin (VITAMIN B-12 PO), Take 1,200 mcg by mouth. Pt only taking twice a week, Disp: , Rfl:  .  ferrous gluconate (FERGON) 324 MG tablet, Take 1 tablet (324 mg total) by mouth daily., Disp: 30 tablet, Rfl: 0 .  fluticasone (FLONASE) 50 MCG/ACT nasal spray, Place into both nostrils daily., Disp: , Rfl:  .  glucose blood (ONE TOUCH ULTRA TEST) test strip, Use as instructed to check blood sugar up to 3 times daily, Disp: 200 each, Rfl: 3 .  levonorgestrel (MIRENA) 20 MCG/24HR IUD, 1 each by Intrauterine route once., Disp: , Rfl:  .  metFORMIN (GLUCOPHAGE) 500 MG tablet, Take 1 tablet (500 mg total) by mouth 2 (two) times daily with a meal., Disp: 180 tablet, Rfl: 0   Review of Systems:   ROS  Negative unless otherwise specified per HPI.   Vitals:   Vitals:   11/26/17 0912  BP: 126/80  Pulse: 92  Temp: 98.8 F (37.1 C)  TempSrc: Oral  SpO2: 99%  Weight: 145 lb (65.8 kg)  Height:  (1.626 m)     Body mass index is 24.89 kg/m.  Physical Exam:   Physical Exam  Constitutional: She appears well-developed. She is cooperative.  Non-toxic appearance. She does not have a sickly appearance. She does not appear ill. No distress.  Cardiovascular: Normal rate, regular rhythm, S1 normal, S2 normal, normal heart sounds and normal pulses.  No LE edema  Pulmonary/Chest: Effort normal and breath sounds normal. Right breast exhibits no nipple discharge, no skin change and no tenderness. Left breast  exhibits no nipple discharge, no skin change and no tenderness.  No palpable tenderness. Breast exam performed, has areas of dense tissue bilaterally.   Neurological: She is alert. GCS eye subscore is 4. GCS verbal subscore is 5. GCS motor subscore is 6.  Skin: Skin is warm, dry and intact.  Psychiatric: She has a normal mood and affect. Her speech is  normal and behavior is normal.  Nursing note and vitals reviewed.   Assessment and Plan:    Javiana was seen today for breast pain and menopause symptoms.  Diagnoses and all orders for this visit:  Breast pain, Hot flashes No red flags on exam. Discussed reducing caffeine intake to see if this helps with symptoms. Briefly discussed plan of care with patient's PCP, Dr. Jacquiline Doe. Consider addition of OCP's to help with symptoms. Will check labs to r/o other organic cause. Continue oral OTC anti-inflammatories prn. -     FSH -     LH -     TSH -     CBC   . Reviewed expectations re: course of current medical issues. . Discussed self-management of symptoms. . Outlined signs and symptoms indicating need for more acute intervention. . Patient verbalized understanding and all questions were answered. . See orders for this visit as documented in the electronic medical record. . Patient received an After-Visit Summary.  CMA or LPN served as scribe during this visit. History, Physical, and Plan performed by medical provider. Documentation and orders reviewed and attested to.  Jarold Motto, PA-C

## 2017-11-30 NOTE — Progress Notes (Signed)
[facility] PREP Weekly Session   Patient Details  Name: [name] MRN: [id number] Date of Birth: [birth year] Age: 47 y.o. PCP: Ardith Dark, MD  Vitals:   11/23/17 1403  Weight: 145 lb 9.6 oz (66 kg)    [name] Weekly seesion - 11/30/17 1400      Weekly Session   Topic Discussed  Healthy eating tips    Minutes exercised this week  180 minutes 120cardio/60yoga   120cardio/60yoga   Classes attended to date  3      On 4/25 [name] reported 75 mins yoga & 20 mins flexibility. On 4/29 she reported 90 mins cardio.   Things you are grateful for:"Cancer free" Barriers:"pain, hormones"  [name] Fillers 11/30/2017, 2:04 PM

## 2017-12-02 ENCOUNTER — Ambulatory Visit: Payer: BC Managed Care – PPO | Admitting: Physician Assistant

## 2017-12-02 ENCOUNTER — Encounter: Payer: Self-pay | Admitting: Physician Assistant

## 2017-12-02 VITALS — BP 120/76 | HR 106 | Temp 98.6°F | Ht 64.0 in | Wt 144.5 lb

## 2017-12-02 DIAGNOSIS — J069 Acute upper respiratory infection, unspecified: Secondary | ICD-10-CM

## 2017-12-02 MED ORDER — AZITHROMYCIN 250 MG PO TABS: ORAL_TABLET | ORAL | 0 refills | Status: DC

## 2017-12-02 NOTE — Progress Notes (Signed)
[facility] PREP Weekly Session   Patient Details  Name: Selena Holt MRN: [id number] Date of Birth: February 28, 1971 Age: 47 y.o. PCP: Ardith Dark, MD  Vitals:   11/30/17 1059  Weight: 144 lb 6.4 oz (65.5 kg)    [name] Weekly seesion - 12/02/17 1000      Weekly Session   Topic Discussed  Health habits    Minutes exercised this week  130 minutes 50cardio/60strength/69flexibility   50cardio/60strength/53flexibility   Classes attended to date  4      Fun things you've done:"Y self-defense class"  Things you're grateful for:"son" Barriers:"time"  [name] Fillers 12/02/2017, 10:59 AM

## 2017-12-02 NOTE — Progress Notes (Signed)
Selena Holt is a 47 y.o. female here for a new problem.  I acted as a Neurosurgeon for [employer], PA-C Corky Mull, LPN  History of Present Illness:   Chief Complaint  Patient presents with  . Sinus Problem    Sinus Problem  This is a new problem. Episode onset: Started Sunday. The problem has been gradually worsening since onset. Maximum temperature: 100 to 101. The fever has been present for 1 to 2 days. Her pain is at a severity of 5/10. The pain is moderate. Associated symptoms include chills, congestion (Nasal, light yellow drainage), a hoarse voice, neck pain, sinus pressure and a sore throat. Pertinent negatives include no coughing, ear pain, headaches, shortness of breath, sneezing or swollen glands. Treatments tried: Flonase and Zyrtec. The treatment provided no relief.    Past Medical History:  Diagnosis Date  . Anemia   . Bipolar disorder (HCC)   . Collapsed lung    "spontaneous" , teenager  . Diabetes mellitus without complication (HCC)   . HSV-1 (herpes simplex virus 1) infection   . Hypertension   . Kidney stones   . Scoliosis (and kyphoscoliosis), idiopathic      Social History   Socioeconomic History  . Marital status: Single    Spouse name: Not on file  . Number of children: 2  . Years of education: Not on file  . Highest education level: Not on file  Occupational History  . 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 Financial resource strain: Not on file  . Food insecurity:    Worry: Not on file    Inability: Not on file  . Transportation needs:    Medical: Not on file    Non-medical: Not on file  Tobacco Use  . Smoking status: Never Smoker  . Smokeless tobacco: Never Used  Substance and Sexual Activity  . Alcohol use: No    Alcohol/week: 0.0 oz    Comment: occ  . Drug use: No  . Sexual activity: Never  Lifestyle  . Physical activity:    Days per week: Not on file   Minutes per session: Not on file  . Stress: Not on file  Relationships  . Social connections:    Talks on phone: Not on file    Gets together: Not on file    Attends religious service: Not on file    Active member of club or organization: Not on file    Attends meetings of clubs or organizations: Not on file    Relationship status: Not on file  . Intimate partner violence:    Fear of current or ex partner: Not on file    Emotionally abused: Not on file    Physically abused: Not on file    Forced sexual activity: Not on file  Other Topics Concern  . Not on file  Social History Narrative   She lives with her grown son. 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    Past Surgical History:  Procedure Laterality Date  . CHEST TUBE INSERTION      Family History  Problem Relation Age of Onset  . Hyperlipidemia Mother   . Hypertension Mother   . Hyperlipidemia Father   . Hypertension Father   . CAD Father   . Heart disease Father   . Alzheimer's disease Maternal Grandmother 50    Allergies  Allergen Reactions  . Tetanus Toxoids Anaphylaxis  tetanus  . Ceclor [Cefaclor] Hives  . Doxycycline Other (See Comments)    Hives  . Penicillins Hives  . Sulfa Antibiotics Hives  . Lisinopril Other (See Comments)    fatigue    Current Medications:   Current Outpatient Medications:  .  cetirizine (ZYRTEC) 10 MG tablet, Take 10 mg by mouth daily., Disp: , Rfl:  .  Cholecalciferol (D3 MAXIMUM STRENGTH) 5000 UNIT/ML LIQD, Take 1 mL (5,000 Units total) by mouth daily with supper. (Patient taking differently: Take 5,000 Units by mouth. Pt only taking one to two times a week), Disp: 30 mL, Rfl: 2 .  Cyanocobalamin (VITAMIN B-12 PO), Take 1,200 mcg by mouth. Pt only taking twice a week, Disp: , Rfl:  .  ferrous gluconate (FERGON) 324 MG tablet, Take 1 tablet (324 mg total) by mouth daily., Disp: 30 tablet, Rfl: 0 .  fluticasone (FLONASE) 50 MCG/ACT nasal spray, Place into both nostrils daily.,  Disp: , Rfl:  .  glucose blood (ONE TOUCH ULTRA TEST) test strip, Use as instructed to check blood sugar up to 3 times daily, Disp: 200 each, Rfl: 3 .  levonorgestrel (MIRENA) 20 MCG/24HR IUD, 1 each by Intrauterine route once., Disp: , Rfl:  .  metFORMIN (GLUCOPHAGE) 500 MG tablet, Take 1 tablet (500 mg total) by mouth 2 (two) times daily with a meal., Disp: 180 tablet, Rfl: 0 .  azithromycin (ZITHROMAX) 250 MG tablet, Take 2 tablets on day 1, and then 1 tablet daily x 4 days, Disp: 6 tablet, Rfl: 0   Review of Systems:   Review of Systems  Constitutional: Positive for chills.  HENT: Positive for congestion (Nasal, light yellow drainage), hoarse voice, sinus pressure and sore throat. Negative for ear pain and sneezing.   Respiratory: Negative for cough and shortness of breath.   Musculoskeletal: Positive for neck pain.  Neurological: Negative for headaches.    Vitals:   Vitals:   12/02/17 1315  BP: 120/76  Pulse: (!) 106  Temp: 98.6 F (37 C)  TempSrc: Oral  SpO2: 98%  Weight: 144 lb 8 oz (65.5 kg)  Height:  (1.626 m)     Body mass index is 24.8 kg/m.  Physical Exam:   Physical Exam  Constitutional: She appears well-developed. She is cooperative.  Non-toxic appearance. She does not have a sickly appearance. She does not appear ill. No distress.  HENT:  Head: Normocephalic and atraumatic.  Right Ear: Tympanic membrane, external ear and ear canal normal. Tympanic membrane is not erythematous, not retracted and not bulging.  Left Ear: Tympanic membrane, external ear and ear canal normal. Tympanic membrane is not erythematous, not retracted and not bulging.  Nose: Mucosal edema and rhinorrhea present. Right sinus exhibits maxillary sinus tenderness (mild). Right sinus exhibits no frontal sinus tenderness. Left sinus exhibits maxillary sinus tenderness (mild). Left sinus exhibits no frontal sinus tenderness.  Mouth/Throat: Uvula is midline and mucous membranes are normal. No  posterior oropharyngeal edema or posterior oropharyngeal erythema. Tonsils are 1+ on the right. Tonsils are 1+ on the left.  Eyes: Conjunctivae and lids are normal.  Neck: Trachea normal.  Cardiovascular: Normal rate, regular rhythm, S1 normal, S2 normal and normal heart sounds.  Pulmonary/Chest: Effort normal and breath sounds normal. She has no decreased breath sounds. She has no wheezes. She has no rhonchi. She has no rales.  Lymphadenopathy:    She has no cervical adenopathy.  Neurological: She is alert.  Skin: Skin is warm, dry and intact.  Psychiatric: She  has a normal mood and affect. Her speech is normal and behavior is normal.  Nursing note and vitals reviewed.   Assessment and Plan:    Sonyia was seen today for sinus problem.  Diagnoses and all orders for this visit:  Upper respiratory tract infection, unspecified type  Other orders -     azithromycin (ZITHROMAX) 250 MG tablet; Take 2 tablets on day 1, and then 1 tablet daily x 4 days   No red flags on exam.  Discussed supportive care. I did provide a pocket prescription of Azithromycin should her symptoms not improve by next week, or if they worsen in the meantime. Discussed taking medications as prescribed. Reviewed return precautions including worsening fever, SOB, worsening cough or other concerns. Push fluids and rest. I recommend that patient follow-up if symptoms worsen or persist despite treatment x 7-10 days, sooner if needed.   . Reviewed expectations re: course of current medical issues. . Discussed self-management of symptoms. . Outlined signs and symptoms indicating need for more acute intervention. . Patient verbalized understanding and all questions were answered. . See orders for this visit as documented in the electronic medical record. . Patient received an After-Visit Summary.  CMA or LPN served as scribe during this visit. History, Physical, and Plan performed by medical provider. Documentation and  orders reviewed and attested to.  Jarold Motto, PA-C

## 2017-12-02 NOTE — Patient Instructions (Signed)
It was great to see you!  You have a viral upper respiratory infection. Antibiotics are not needed for this.  Viral infections usually take 7-10 days to resolve.  The cough can last a few weeks to go away.  Use medication as prescribed: Azithromycin (Z-pack) -- should your symptoms not improve by end of weekend Push fluids and get plenty of rest. Please return if you are not improving as expected, or if you have high fevers (>101.5) or difficulty swallowing or worsening productive cough.  Call clinic with questions.  I hope you start feeling better soon!

## 2017-12-11 NOTE — Progress Notes (Signed)
[facility] PREP Weekly Session   Patient Details  Name: Selena Holt MRN: [id number] Date of Birth: 10-Feb-1971 Age: 47 y.o. PCP: Ardith Dark, MD  Vitals:   12/07/17 1401  Weight: 146 lb (66.2 kg)    [name] Weekly seesion - 12/11/17 1400      Weekly Session   Topic Discussed  Restaurant Eating    Minutes exercised this week  105 minutes 60cardio/45strength   60cardio/45strength     Things you are grateful for:"this group" Nutrition celebrations:"more water" Barriers:"getting [facility] on non-meeting days"  [name] Fillers 12/11/2017, 2:03 PM

## 2017-12-18 DIAGNOSIS — F411 Generalized anxiety disorder: Secondary | ICD-10-CM | POA: Diagnosis not present

## 2017-12-18 DIAGNOSIS — F330 Major depressive disorder, recurrent, mild: Secondary | ICD-10-CM | POA: Diagnosis not present

## 2017-12-18 NOTE — Progress Notes (Signed)
[facility] PREP Weekly Session   Patient Details  Name: Selena Holt MRN: [id number] Date of Birth: 1970-12-15 Age: 47 y.o. PCP: Ardith Dark, MD  Vitals:   12/14/17 4098  Weight: 143 lb 6.4 oz (65 kg)    [name] Weekly seesion - 12/18/17 0900      Weekly Session   Topic Discussed  Restaurant Eating    Minutes exercised this week  180 minutes 90cardio/60strength/42flexibility   90cardio/60strength/90flexibility     Fun things you did since last meeting:"Got back on stand-up eliptical" Things you are grateful for:"Interviewing at the Y. Got an [employer] watch to track workouts"   [name] Fillers 12/18/2017, 9:26 AM

## 2017-12-24 ENCOUNTER — Other Ambulatory Visit: Payer: Self-pay | Admitting: Family Medicine

## 2017-12-24 DIAGNOSIS — E119 Type 2 diabetes mellitus without complications: Secondary | ICD-10-CM

## 2017-12-28 NOTE — Progress Notes (Signed)
[facility]   Patient Details  Name: Selena CoffeeZondra K Holt MRN: [id number] Date of Birth: 20-Nov-1970 Age: 47 y.o. PCP: Ardith DarkParker, Caleb M, MD  Vitals:   12/21/17 1245  Weight: 144 lb 6.4 oz (65.5 kg)    [name] Weekly seesion - 12/28/17 1200      Weekly Session   Topic Discussed  Stress management and problem solving    Minutes exercised this week  170 minutes 85cardio/85strength   85cardio/85strength     Fun things you did since last meeting:"Movie" Things you are grateful for:"Health"  [name] FillersDebbie Wilfrido Luedke 12/28/2017, 12:46 PM

## 2017-12-31 DIAGNOSIS — N644 Mastodynia: Secondary | ICD-10-CM | POA: Diagnosis not present

## 2017-12-31 DIAGNOSIS — N959 Unspecified menopausal and perimenopausal disorder: Secondary | ICD-10-CM | POA: Diagnosis not present

## 2017-12-31 DIAGNOSIS — Z01419 Encounter for gynecological examination (general) (routine) without abnormal findings: Secondary | ICD-10-CM | POA: Diagnosis not present

## 2017-12-31 DIAGNOSIS — Z6824 Body mass index (BMI) 24.0-24.9, adult: Secondary | ICD-10-CM | POA: Diagnosis not present

## 2017-12-31 DIAGNOSIS — N760 Acute vaginitis: Secondary | ICD-10-CM | POA: Diagnosis not present

## 2017-12-31 DIAGNOSIS — E119 Type 2 diabetes mellitus without complications: Secondary | ICD-10-CM | POA: Diagnosis not present

## 2017-12-31 DIAGNOSIS — R32 Unspecified urinary incontinence: Secondary | ICD-10-CM | POA: Diagnosis not present

## 2017-12-31 DIAGNOSIS — N83299 Other ovarian cyst, unspecified side: Secondary | ICD-10-CM | POA: Diagnosis not present

## 2017-12-31 DIAGNOSIS — N926 Irregular menstruation, unspecified: Secondary | ICD-10-CM | POA: Diagnosis not present

## 2018-01-04 NOTE — Progress Notes (Signed)
[facility]   Patient Details  Name: Selena CoffeeZondra K Holt MRN: [id number] Date of Birth: 10/22/1970 Age: 47 y.o. PCP: Ardith DarkParker, Caleb M, MD  Vitals:   12/28/17 1141  Weight: 144 lb 9.6 oz (65.6 kg)    [name] Weekly seesion - 01/04/18 1100      Weekly Session   Topic Discussed  Importance of resistance training    Minutes exercised this week  80 minutes 50cardio/30strength   50cardio/30strength     Fun things you did since last meeting:"had an aura photo" Barriers:"work stressors"   [name] FillersDebbie Bijal Siglin 01/04/2018, 11:41 AM

## 2018-01-07 ENCOUNTER — Encounter: Payer: Self-pay | Admitting: Family Medicine

## 2018-01-07 ENCOUNTER — Ambulatory Visit: Payer: BC Managed Care – PPO | Admitting: Family Medicine

## 2018-01-07 VITALS — BP 118/72 | HR 95 | Temp 98.4°F | Ht 64.0 in | Wt 142.4 lb

## 2018-01-07 DIAGNOSIS — D509 Iron deficiency anemia, unspecified: Secondary | ICD-10-CM

## 2018-01-07 DIAGNOSIS — E538 Deficiency of other specified B group vitamins: Secondary | ICD-10-CM | POA: Diagnosis not present

## 2018-01-07 DIAGNOSIS — E559 Vitamin D deficiency, unspecified: Secondary | ICD-10-CM

## 2018-01-07 DIAGNOSIS — E785 Hyperlipidemia, unspecified: Secondary | ICD-10-CM

## 2018-01-07 DIAGNOSIS — E1169 Type 2 diabetes mellitus with other specified complication: Secondary | ICD-10-CM

## 2018-01-07 DIAGNOSIS — E119 Type 2 diabetes mellitus without complications: Secondary | ICD-10-CM | POA: Diagnosis not present

## 2018-01-07 DIAGNOSIS — J3489 Other specified disorders of nose and nasal sinuses: Secondary | ICD-10-CM | POA: Diagnosis not present

## 2018-01-07 LAB — COMPREHENSIVE METABOLIC PANEL
ALT: 71 U/L — ABNORMAL HIGH (ref 0–35)
AST: 35 U/L (ref 0–37)
Albumin: 5 g/dL (ref 3.5–5.2)
Alkaline Phosphatase: 96 U/L (ref 39–117)
BUN: 13 mg/dL (ref 6–23)
CO2: 27 mEq/L (ref 19–32)
Calcium: 10.1 mg/dL (ref 8.4–10.5)
Chloride: 103 mEq/L (ref 96–112)
Creatinine, Ser: 0.74 mg/dL (ref 0.40–1.20)
GFR: 90 mL/min (ref >60.00)
Glucose, Bld: 131 mg/dL — ABNORMAL HIGH (ref 70–99)
Potassium: 4.1 mEq/L (ref 3.5–5.1)
Sodium: 139 mEq/L (ref 135–145)
Total Bilirubin: 0.6 mg/dL (ref 0.2–1.2)
Total Protein: 7.8 g/dL (ref 6.0–8.3)

## 2018-01-07 LAB — CBC
HCT: 38.6 % (ref 36.0–46.0)
Hemoglobin: 13.2 g/dL (ref 12.0–15.0)
MCHC: 34.3 g/dL (ref 30.0–36.0)
MCV: 93.3 fl (ref 78.0–100.0)
Platelets: 278 10*3/uL (ref 150.0–400.0)
RBC: 4.14 Mil/uL (ref 3.87–5.11)
RDW: 13.2 % (ref 11.5–15.5)
WBC: 8.6 10*3/uL (ref 4.0–10.5)

## 2018-01-07 LAB — VITAMIN B12: Vitamin B-12: 506 pg/mL (ref 211–911)

## 2018-01-07 LAB — LIPID PANEL
Cholesterol: 177 mg/dL (ref 0–200)
HDL: 43.1 mg/dL (ref >39.00)
LDL Cholesterol: 112 mg/dL — ABNORMAL HIGH (ref 0–99)
NonHDL: 133.76
Total CHOL/HDL Ratio: 4
Triglycerides: 107 mg/dL (ref 0.0–149.0)
VLDL: 21.4 mg/dL (ref 0.0–40.0)

## 2018-01-07 LAB — TSH: TSH: 2.66 u[IU]/mL (ref 0.35–4.50)

## 2018-01-07 LAB — HEMOGLOBIN A1C: Hgb A1c MFr Bld: 6.4 % (ref 4.6–6.5)

## 2018-01-07 LAB — VITAMIN D 25 HYDROXY (VIT D DEFICIENCY, FRACTURES): VITD: 37.31 ng/mL (ref 30.00–100.00)

## 2018-01-07 MED ORDER — METFORMIN HCL 500 MG PO TABS: 500.0000 mg | ORAL_TABLET | Freq: Every evening | ORAL | 0 refills | Status: DC

## 2018-01-07 NOTE — Progress Notes (Signed)
   Subjective:  Selena Holt is a 47 y.o. female who presents today with a chief complaint of T2DM follow up.   HPI:  T2DM, chronic problem, stable Currently on metformin 500mg  daily.  Does well on this dose without side effects.  Iron deficiency anemia, chronic problem, stable Patient was recently at her OB/GYN and had a hemoglobin level of 10.5.  She is currently on ferrous gluconate 324 mg daily.  Occasional constipation with this but otherwise tolerates well.  Vitamin D deficiency, chronic problem, stable Patient has had levels of low vitamin D in the past.  She is currently taking over-the-counter vitamin D supplement.  She would like to have levels rechecked today.  Vitamin B12 deficiency, chronic problem, stable Patient currently on oral vitamin B12 replacement.  Would like to have levels checked.  Dyslipidemia, chronic problem, stable Patient has previously been on cholesterol medication however is not currently on anything.  Sinus pressure, new problem Started about a month ago.  Located on the left side of her face.  She does have any allergies and use Claritin and Zyrtec.  Thinks that this dried out her nasal passageways and sinuses because she had a lot of nasal bleeding.  She has not had any bleeding for the past month, but has had pressure to the left side of her face.  No fevers.  No drainage.  ROS: Per HPI  PMH: She reports that she has never smoked. She has never used smokeless tobacco. She reports that she does not drink alcohol or use drugs.  Objective:  Physical Exam: BP 118/72 (BP Location: Left Arm, Patient Position: Sitting, Cuff Size: Normal)   Pulse 95   Temp 98.4 F (36.9 C) (Oral)   Ht 5\' 4"  (1.626 m)   Wt 142 lb 6.4 oz (64.6 kg)   SpO2 97%   BMI 24.44 kg/m   Wt Readings from Last 3 Encounters:  01/07/18 142 lb 6.4 oz (64.6 kg)  12/28/17 144 lb 9.6 oz (65.6 kg)  12/21/17 144 lb 6.4 oz (65.5 kg)   Gen: NAD, resting comfortably HEENT: TMs  clear.  Nasal mucosa erythematous with small amount of clear discharge.  Leftward nasal septal deviation noted.  Maxillary sinuses decreased to transillumination bilaterally. CV: RRR with no murmurs appreciated Pulm: NWOB, CTAB with no crackles, wheezes, or rhonchi  Assessment/Plan:  T2DM (type 2 diabetes mellitus) (HCC) Stable.  Continue metformin 500 mg daily.  Check A1c today.  Discussed benefit of statin use in patients with diabetes.  Patient declined starting statin today.  We will check lipid panel.  Please see below problem.  Anemia, iron deficiency Continue ferrous gluconate 324 mg daily.  Check CBC and iron panel.  Vitamin D deficiency Continue over-the-counter vitamin D supplementation.  Check vitamin D level today.  Vitamin B 12 deficiency Continue over-the-counter supplementation.  Check B12 level today.  Dyslipidemia associated with type 2 diabetes mellitus (HCC) Check lipid panel.  Discussed benefit of statin use of patient's diabetes.  Patient declined today.  Sinus pressure with deviated septum Recommend patient start Zyrtec or Flonase.  If no improvement, would consider a course of prednisone and antibiotics.  May ultimately need advanced imaging and/referral to ENT.  Has nasal septal deviation which is also contributing.  Katina Degreealeb M. Jimmey RalphParker, MD 01/07/2018 9:09 AM

## 2018-01-07 NOTE — Assessment & Plan Note (Signed)
Continue over-the-counter vitamin D supplementation.  Check vitamin D level today.

## 2018-01-07 NOTE — Patient Instructions (Signed)
It was very nice to see you today!  Keep up the good work!  No medication changes today.  I would like for you to start a cholesterol medication to help lower your risk of heart attack and stroke.  Even taking this once or twice weekly could lower your risk.  Please take Zyrtec or Flonase for your sinus pressure.  Let me know if your symptoms do not improve.  We will check blood work today.  Please come back to see me in 3 to 6 months.  Take care, Dr Jimmey RalphParker

## 2018-01-07 NOTE — Assessment & Plan Note (Signed)
Recommend patient start Zyrtec or Flonase.  If no improvement, would consider a course of prednisone and antibiotics.  May ultimately need advanced imaging and/referral to ENT.  Has nasal septal deviation which is also contributing.

## 2018-01-07 NOTE — Assessment & Plan Note (Signed)
Stable.  Continue metformin 500 mg daily.  Check A1c today.  Discussed benefit of statin use in patients with diabetes.  Patient declined starting statin today.  We will check lipid panel.  Please see below problem.

## 2018-01-07 NOTE — Assessment & Plan Note (Signed)
Check lipid panel.  Discussed benefit of statin use of patient's diabetes.  Patient declined today.

## 2018-01-07 NOTE — Assessment & Plan Note (Signed)
Continue ferrous gluconate 324 mg daily.  Check CBC and iron panel.

## 2018-01-07 NOTE — Assessment & Plan Note (Signed)
Continue over-the-counter supplementation.  Check B12 level today.

## 2018-01-08 ENCOUNTER — Encounter: Payer: Self-pay | Admitting: Family Medicine

## 2018-01-08 ENCOUNTER — Encounter: Payer: Self-pay | Admitting: Medical

## 2018-01-08 DIAGNOSIS — K7689 Other specified diseases of liver: Secondary | ICD-10-CM | POA: Insufficient documentation

## 2018-01-08 DIAGNOSIS — K760 Fatty (change of) liver, not elsewhere classified: Secondary | ICD-10-CM | POA: Insufficient documentation

## 2018-01-08 LAB — IRON,TIBC AND FERRITIN PANEL
%SAT: 25 % (calc) (ref 16–45)
Ferritin: 88 ng/mL (ref 16–232)
Iron: 91 ug/dL (ref 40–190)
TIBC: 366 mcg/dL (calc) (ref 250–450)

## 2018-01-11 NOTE — Progress Notes (Signed)
[facility]   Patient Details  Name: Selena Holt MRN: [id number] Date of Birth: 04/15/1971 Age: 47 y.o. PCP: Ardith DarkParker, Caleb M, MD  Vitals:   01/11/18 1430  Weight: 143 lb (64.9 kg)    [name] Weekly seesion - 01/11/18 1400      Weekly Session   Topic Discussed  Expectations and non-scale victories    Minutes exercised this week  120 minutes 60cardio/60strength   60cardio/60strength     Things you are grateful for:"Employment" Nutrition celebrations:"Drinking more water"  [name] FillersDebbie Ladislao Cohenour 01/11/2018, 2:30 PM

## 2018-01-12 ENCOUNTER — Telehealth: Payer: Self-pay | Admitting: Family Medicine

## 2018-01-12 NOTE — Telephone Encounter (Signed)
I tried to call the patient but could not reach her.  I have sent her a message through MyChart.  CRM started.

## 2018-01-12 NOTE — Telephone Encounter (Signed)
Copied from CRM [id number]. Topic: Quick Communication - See Telephone Encounter >> Jan 12, 2018  8:56 AM Burchel, Abbi R wrote: See Telephone encounter for: 01/12/18.  Pt requests a call from Dr. Lavone NeriParker's nurse re: f/up question about sinus symptoms from OV on 6/20.  Pt was not disclose info to me.

## 2018-01-12 NOTE — Telephone Encounter (Signed)
See note

## 2018-01-13 ENCOUNTER — Ambulatory Visit (HOSPITAL_COMMUNITY)
Admission: EM | Admit: 2018-01-13 | Discharge: 2018-01-13 | Disposition: A | Discharge status: Home / Self Care | Payer: BC Managed Care – PPO

## 2018-01-13 ENCOUNTER — Ambulatory Visit: Payer: Self-pay | Admitting: *Deleted

## 2018-01-13 ENCOUNTER — Other Ambulatory Visit: Payer: Self-pay

## 2018-01-13 DIAGNOSIS — R000 Tachycardia, unspecified: Secondary | ICD-10-CM | POA: Diagnosis not present

## 2018-01-13 DIAGNOSIS — R002 Palpitations: Secondary | ICD-10-CM

## 2018-01-13 NOTE — Telephone Encounter (Signed)
Pt reports CP at mid-chest, onset last night. States "Squeezing type pain" intermittent, rates at 4/10. Episodes last 30 seconds, nauseated during episodes, "flushed."  States "Apple Watch this am read HR of 120 sustained."  During call pt reports CP present, radiating "up."  Nausea present. Denies SOB.  Pt directed to ED/UC. States will follow disposition. Care advise given per protocol. Reason for Disposition . [1] Chest pain lasting <= 5 minutes AND [2] NO chest pain or cardiac symptoms now(Exceptions: pains lasting a few seconds)    Disposition changed, nausea with CP, has presently, HR sustained at 120 this am  Answer Assessment - Initial Assessment Questions 1. LOCATION: "Where does it hurt?"       Mid chest at sternum 2. RADIATION: "Does the pain go anywhere else?" (e.g., into neck, jaw, arms, back)     no 3. ONSET: "When did the chest pain begin?" (Minutes, hours or days)      Last night 1800 4. PATTERN "Does the pain come and go, or has it been constant since it started?"  "Does it get worse with exertion?"      Comes and goes, unsure with exertion 5. DURATION: "How long does it last" (e.g., seconds, minutes, hours)     30 minutes 6. SEVERITY: "How bad is the pain?"  (e.g., Scale 1-10; mild, moderate, or severe)    - MILD (1-3): doesn't interfere with normal activities     - MODERATE (4-7): interferes with normal activities or awakens from sleep    - SEVERE (8-10): excruciating pain, unable to do any normal activities       3-4/10 7. CARDIAC RISK FACTORS: "Do you have any history of heart problems or risk factors for heart disease?" (e.g., prior heart attack, angina; high blood pressure, diabetes, being overweight, high cholesterol, smoking, or strong family history of heart disease)      8. PULMONARY RISK FACTORS: "Do you have any history of lung disease?"  (e.g., blood clots in lung, asthma, emphysema, birth control pills)     no 9. CAUSE: "What do you think is causing the chest  pain?"      10. OTHER SYMPTOMS: "Do you have any other symptoms?" (e.g., dizziness, nausea, vomiting, sweating, fever, difficulty breathing, cough)       Nausea, flushed  Protocols used: CHEST PAIN-A-AH

## 2018-01-13 NOTE — Telephone Encounter (Signed)
See note

## 2018-01-13 NOTE — Telephone Encounter (Signed)
Pt returning call to the office stating that she went to [facility] and was unable to get checked in, [facility] did not have a EKG and [facility] did not open until one. Pt advised to go to ED for treatment. Pt requesting to come in office for appt because it would be cheaper and she can not afford to go to the Ed for treatment. Pt encouraged again to seek treatment in the ED for current symptoms.

## 2018-01-13 NOTE — ED Triage Notes (Signed)
States last night she had a funny feeling in her chest, then felt fluttery. This morning her apple watch told her her rate was 120. EKG taken and given to Dr. Tracie HarrierHagler, per Dr. Tracie HarrierHagler pt needs eval in the ER. Pt agreeable to plan.

## 2018-01-13 NOTE — Progress Notes (Signed)
[facility]   Patient Details  Name: Meda CoffeeZondra K Longton MRN: [id number] Date of Birth: 1971/07/15 Age: 47 y.o. PCP: Ardith DarkParker, Caleb M, MD  Vitals:   01/11/18 1400  Weight: 143 lb 3.2 oz (65 kg)    [facility] - 01/13/18 1400      Weekly Session   Topic Discussed  Other Portion control   Portion control     Barriers:"time/work"  [name] FillersDebbie Aleshia Cartelli 01/13/2018, 2:00 PM

## 2018-01-13 NOTE — Telephone Encounter (Signed)
Agree with triage.  Patient needs to be seen in ED.

## 2018-01-14 ENCOUNTER — Ambulatory Visit (INDEPENDENT_AMBULATORY_CARE_PROVIDER_SITE_OTHER): Payer: BC Managed Care – PPO

## 2018-01-14 ENCOUNTER — Encounter: Payer: Self-pay | Admitting: Family Medicine

## 2018-01-14 ENCOUNTER — Ambulatory Visit (INDEPENDENT_AMBULATORY_CARE_PROVIDER_SITE_OTHER): Payer: BC Managed Care – PPO | Admitting: Family Medicine

## 2018-01-14 VITALS — BP 132/78 | HR 128 | Temp 98.2°F | Ht 64.0 in | Wt 142.0 lb

## 2018-01-14 DIAGNOSIS — F419 Anxiety disorder, unspecified: Secondary | ICD-10-CM | POA: Insufficient documentation

## 2018-01-14 DIAGNOSIS — R079 Chest pain, unspecified: Secondary | ICD-10-CM | POA: Diagnosis not present

## 2018-01-14 DIAGNOSIS — F439 Reaction to severe stress, unspecified: Secondary | ICD-10-CM

## 2018-01-14 DIAGNOSIS — R000 Tachycardia, unspecified: Secondary | ICD-10-CM | POA: Diagnosis not present

## 2018-01-14 DIAGNOSIS — J3489 Other specified disorders of nose and nasal sinuses: Secondary | ICD-10-CM | POA: Diagnosis not present

## 2018-01-14 DIAGNOSIS — R0602 Shortness of breath: Secondary | ICD-10-CM | POA: Diagnosis not present

## 2018-01-14 LAB — D-DIMER, QUANTITATIVE (NOT AT ARMC): D-Dimer, Quant: 0.23 mcg/mL FEU (ref <0.50)

## 2018-01-14 LAB — TROPONIN I: TNIDX: 0 ug/l (ref 0.00–0.06)

## 2018-01-14 MED ORDER — HYDROXYZINE HCL 50 MG PO TABS
25.0000 mg | ORAL_TABLET | Freq: Three times a day (TID) | ORAL | 1 refills | Status: DC | PRN
Start: 2018-01-14 — End: 2018-09-06

## 2018-01-14 NOTE — Progress Notes (Signed)
Dr Lavone NeriParker's interpretation of your lab work:  Your chest xray is NORMAL.    If you have any additional questions, please give us a call or send us a message through [place].  Take care, Dr Jimmey RalphParker

## 2018-01-14 NOTE — Assessment & Plan Note (Signed)
Secondary to job stress.  Will start hydroxyzine 25 to 100 mg 3 times daily as needed for anxiety.  Discussed side effects this medication.  She will be following up with her psychiatrist and therapist.  Letter was given to excuse patient from work for the rest of the week.

## 2018-01-14 NOTE — Progress Notes (Signed)
   Subjective:  Selena Holt is a 47 y.o. female who presents today with a chief complaint of chest pain.   HPI:  Chest Pain/Palpitations, acute problem Started 2 days ago.  Stable at home.  Feels like a squeezing sensation in the lower chest.  Went to urgent care yesterday and had a EKG done which was reportedly "okay" was recommended to go to the emergency department.  She did not do this because she was concerned about the cost.  Sensations have been stable over the past day.  Intermittently has episodes of nausea, palpitations and chest pressure. She also has occasional shortness of breath with walking.  She has been under a lot of stress at work and thinks this is contributing. No other obvious alleviating or aggravating factors.   Stress, New problem Patient also with increased stress at work for the past couple of months. 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  She has previously been to a therapist and psychiatrist.  She will be planning on seeing them soon.  She is not currently taking any medications for this.  ROS: Per HPI  PMH: She reports that she has never smoked. She has never used smokeless tobacco. She reports that she does not drink alcohol or use drugs.  Objective:  Physical Exam: BP 132/78 (BP Location: Left Arm, Patient Position: Sitting, Cuff Size: Normal)   Pulse (!) 128   Temp 98.2 F (36.8 C) (Oral)   Ht 5\' 4"  (1.626 m)   Wt 142 lb (64.4 kg)   SpO2 98%   BMI 24.37 kg/m   Gen: NAD, resting comfortably CV: Tachycardia with no murmurs appreciated Pulm: NWOB, CTAB with no crackles, wheezes, or rhonchi GI: Normal bowel sounds present. Soft, Nontender, Nondistended.  EKG: Sinus tachycardia ventricular rate 107.  T wave flattening in lead V2.  No acute ischemic changes.  Stable compared to prior EKGs.  Assessment/Plan:  Chest Pain / Tachycardia / Palpitations Likely stress related.  EKG yesterday and today are without ischemic changes.  Low  suspicion for cardiac disease given atypical history and EKG findings.  Will check d-dimer to rule out PE.  Check troponin to definitively exclude myocardial ischemia.  Her CXR is normal by my read - will await radiology read. Discussed reasons to return to care and seek emergent care.  Stress Secondary to job stress.  Will start hydroxyzine 25 to 100 mg 3 times daily as needed for anxiety.  Discussed side effects this medication.  She will be following up with her psychiatrist and therapist.  Letter was given to excuse patient from work for the rest of the week.  Sinus pressure with deviated septum Improving however developed some nasal soreness with Flonase.  Recommended her stopping Flonase and try more the over-the-counter antihistamine such as Claritin or Zyrtec.  Katina Degreealeb M. Jimmey RalphParker, MD 01/14/2018 9:09 AM

## 2018-01-14 NOTE — Assessment & Plan Note (Signed)
Improving however developed some nasal soreness with Flonase.  Recommended her stopping Flonase and try more the over-the-counter antihistamine such as Claritin or Zyrtec.

## 2018-01-14 NOTE — Patient Instructions (Addendum)
It was very nice to see you today!  I am sorry that you are under so much stress. Please start the hydroxyzine. Please follow up with your psychiatrist and therapist.  We will check blood work today to look for any signs of blood clot or heart damage.  We will call you with these results.  Please let me know if your symptoms worsen over the next few days.  Come back to see me in 3 to 6 months, or sooner as needed if you are not able to get into see your psychiatrist.  Take care, Dr Jimmey RalphParker

## 2018-01-25 NOTE — Progress Notes (Signed)
[facility]   Patient Details  Name: Selena Holt MRN: [id number] Date of Birth: [birth year] Age: 47 y.o. PCP: Ardith DarkParker, Caleb M, MD  Vitals:   01/18/18 1205  Weight: 143 lb 3.2 oz (65 kg)    [name] Weekly seesion - 01/25/18 1200      Weekly Session   Topic Discussed  Hitting roadblocks    Minutes exercised this week  -- 30strength   30strength     Fun things you did since last meeting:"CPR class" Things you are grateful for:"Friends/family/Vacation" Nutrition celebrations:"Meal at home!LOL" Barriers:"stress/anxiety/waiting on test results"  [name] FillersDebbie Leannah Guse 01/25/2018, 12:06 PM

## 2018-02-22 ENCOUNTER — Encounter: Payer: Self-pay | Admitting: Family Medicine

## 2018-02-22 ENCOUNTER — Ambulatory Visit (INDEPENDENT_AMBULATORY_CARE_PROVIDER_SITE_OTHER): Payer: BC Managed Care – PPO | Admitting: Family Medicine

## 2018-02-22 VITALS — BP 126/78 | HR 87 | Temp 98.2°F | Ht 64.0 in | Wt 145.6 lb

## 2018-02-22 DIAGNOSIS — M545 Low back pain: Secondary | ICD-10-CM | POA: Diagnosis not present

## 2018-02-22 DIAGNOSIS — R82998 Other abnormal findings in urine: Secondary | ICD-10-CM | POA: Diagnosis not present

## 2018-02-22 DIAGNOSIS — R300 Dysuria: Secondary | ICD-10-CM | POA: Diagnosis not present

## 2018-02-22 LAB — POCT URINALYSIS DIPSTICK
Bilirubin, UA: NEGATIVE
Blood, UA: NEGATIVE
Glucose, UA: POSITIVE — AB
Ketones, UA: NEGATIVE
Leukocytes, UA: NEGATIVE
Nitrite, UA: NEGATIVE
Protein, UA: NEGATIVE
Spec Grav, UA: 1.015 (ref 1.010–1.025)
Urobilinogen, UA: 0.2 E.U./dL
pH, UA: 6 (ref 5.0–8.0)

## 2018-02-22 NOTE — Patient Instructions (Addendum)
It was very nice to see you today!  I do not see any signs of an infection today.  It is possible that your symptoms could be from dehydration.  Will check urine culture today to definitively tell if urinary tract infection or not.  We will call you if we need to start antibiotics.  Please let us know if your symptoms worsen or do not improve over the next few days.  Take care, Dr Jimmey RalphParker

## 2018-02-22 NOTE — Progress Notes (Signed)
   Subjective:  Selena Holt is a 47 y.o. female who presents today for same-day appointment with a chief complaint of dark urine.   HPI:  Dark Urine, Acute problem Started 3 days ago. Improved over that time. Has had some associated low back pain as well.  Urine is back to her normal color.  Pain feels similar to prior kidney stone however not as severe.  No fever chills.  No abdominal pain.  No nausea or vomiting.  No reported hematuria.  ROS: Per HPI  Objective:  Physical Exam: BP 126/78 (BP Location: Left Arm, Patient Position: Sitting, Cuff Size: Normal)   Pulse 87   Temp 98.2 F (36.8 C) (Oral)   Ht 5\' 4"  (1.626 m)   Wt 145 lb 9.6 oz (66 kg)   SpO2 99%   BMI 24.99 kg/m   Gen: NAD, resting comfortably CV: RRR with no murmurs appreciated Pulm: NWOB, CTAB with no crackles, wheezes, or rhonchi GI: Normal bowel sounds present. Soft, Nontender, Nondistended. MSK: -Back: No deformities.  Tender palpation along right paraspinal muscles.  No CVA tenderness.  Results for orders placed or performed in visit on 02/22/18 (from the past 24 hour(s))  POCT urinalysis dipstick     Status: Abnormal   Collection Time: 02/22/18  2:21 PM  Result Value Ref Range   Color, UA Yellow    Clarity, UA Clear    Glucose, UA Positive (A) Negative   Bilirubin, UA Negative    Ketones, UA Negative    Spec Grav, UA 1.015 1.010 - 1.025   Blood, UA Negative    pH, UA 6.0 5.0 - 8.0   Protein, UA Negative Negative   Urobilinogen, UA 0.2 0.2 or 1.0 E.U./dL   Nitrite, UA Negative    Leukocytes, UA Negative Negative   Appearance     Odor      Assessment/Plan:  Dark urine/back pain No red flag signs or symptoms.  UA negative.  Check urine culture to definitively rule out UTI.  Back pain possibly secondary to mild muscular strain.  Recommended good oral hydration.  Use over-the-counter analgesics as needed.  Discussed reasons to return to care.  Follow-up as needed.  Katina Degreealeb M. Jimmey RalphParker, MD 02/22/2018  2:24 PM

## 2018-02-23 LAB — URINE CULTURE
MICRO NUMBER:: 90922870
SPECIMEN QUALITY:: ADEQUATE

## 2018-02-24 NOTE — Progress Notes (Signed)
Dr Lavone NeriParker's interpretation of your lab work:  Your urine culture was negative. You do not have a UTI. Please let me know if your symptoms worsen or return over the next several days.  If you have any additional questions, please give us a call or send us a message through [email or web address].  Take care, Dr Jimmey RalphParker

## 2018-03-05 ENCOUNTER — Encounter: Payer: Self-pay | Admitting: Family Medicine

## 2018-03-12 DIAGNOSIS — N760 Acute vaginitis: Secondary | ICD-10-CM | POA: Diagnosis not present

## 2018-03-12 DIAGNOSIS — N9089 Other specified noninflammatory disorders of vulva and perineum: Secondary | ICD-10-CM | POA: Diagnosis not present

## 2018-03-12 DIAGNOSIS — N368 Other specified disorders of urethra: Secondary | ICD-10-CM | POA: Diagnosis not present

## 2018-03-30 DIAGNOSIS — K644 Residual hemorrhoidal skin tags: Secondary | ICD-10-CM | POA: Diagnosis not present

## 2018-03-30 DIAGNOSIS — N926 Irregular menstruation, unspecified: Secondary | ICD-10-CM | POA: Diagnosis not present

## 2018-04-29 ENCOUNTER — Encounter: Payer: Self-pay | Admitting: Family Medicine

## 2018-04-29 ENCOUNTER — Ambulatory Visit: Payer: Self-pay | Admitting: Family Medicine

## 2018-04-29 ENCOUNTER — Other Ambulatory Visit (HOSPITAL_COMMUNITY)
Admission: RE | Admit: 2018-04-29 | Discharge: 2018-04-29 | Disposition: A | Discharge status: Home / Self Care | Payer: BLUE CROSS/BLUE SHIELD | Source: Ambulatory Visit | Attending: Family Medicine | Admitting: Family Medicine

## 2018-04-29 VITALS — BP 118/80 | HR 77 | Temp 97.8°F | Ht 64.0 in | Wt 146.4 lb

## 2018-04-29 DIAGNOSIS — J029 Acute pharyngitis, unspecified: Secondary | ICD-10-CM

## 2018-04-29 LAB — POCT RAPID STREP A (OFFICE): Rapid Strep A Screen: NEGATIVE

## 2018-04-29 NOTE — Progress Notes (Signed)
Patient: Selena Holt MRN: [id number] DOB: May 10, 1971 PCP: Ardith Dark, MD     Subjective:  Chief Complaint  Patient presents with  . Sore Throat    x 3 days    HPI: The patient is a 47 y.o. female who presents today for sore throat. She states her throat started to hurt about 3 days ago and she has some blisters/ulcers that she can see that are very sore. Ulcers are only in her throat. No other sick contacts. 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 No fever/chills. She states she can eat and drink fine. She has no cough or other respiratory symptoms. She has gargled with warm salt water. She did have oral sex in august with a new partner.    Review of Systems  Constitutional: Negative for chills and fever.  HENT: Positive for sore throat. Negative for congestion, ear pain, sinus pressure and sinus pain.   Respiratory: Negative for cough and shortness of breath.   Cardiovascular: Negative for chest pain.  Gastrointestinal: Negative for abdominal pain and nausea.  Musculoskeletal: Negative for neck pain and neck stiffness.  Skin: Negative.   Neurological: Negative for headaches.    Allergies Patient is allergic to tetanus toxoids; ceclor [cefaclor]; doxycycline; penicillins; sulfa antibiotics; and lisinopril.  Past Medical History Patient  has a past medical history of Anemia, Bipolar disorder (HCC), Collapsed lung, Diabetes mellitus without complication (HCC), HSV-1 (herpes simplex virus 1) infection, Hypertension, Kidney stones, and Scoliosis (and kyphoscoliosis), idiopathic.  Surgical History Patient  has a past surgical history that includes Chest tube insertion.  Family History Pateint's family history includes Alzheimer's disease (age of onset: 17) in her maternal grandmother; CAD in her father; Heart disease in her father; Hyperlipidemia in her father and mother; Hypertension in her father and mother.  Social History Patient  reports that she has never smoked.  She has never used smokeless tobacco. She reports that she does not drink alcohol or use drugs.    Objective: Vitals:   04/29/18 1143  BP: 118/80  Pulse: 77  Temp: 97.8 F (36.6 C)  TempSrc: Oral  SpO2: 100%  Weight: 146 lb 6.4 oz (66.4 kg)  Height: 5\' 4"  (1.626 m)    Body mass index is 25.13 kg/m.  Physical Exam  HENT:  Right Ear: Tympanic membrane normal.  Left Ear: Tympanic membrane normal.  Mouth/Throat: Posterior oropharyngeal erythema present. No oropharyngeal exudate. Tonsils are 0 on the right. Tonsils are 0 on the left. No tonsillar exudate.  She has possible ulcer on left anterior faucial pillar. Not ulcerated out and not vesicular.   Neck: Normal range of motion. Neck supple.  Cardiovascular: Normal rate, regular rhythm and normal heart sounds.  Pulmonary/Chest: Effort normal and breath sounds normal.  Abdominal: Soft. Bowel sounds are normal.  Lymphadenopathy:    She has no cervical adenopathy.  Vitals reviewed.     rapid strep: negative  Assessment/plan: 1. Sore throat Viral pharyngitis vs. Herpangina vs. Gonorrhea. Checking culture. discussed safe sex practices. No vesicular lesion to think this is herpes. Conservative treatment at this time. Suggested ibuprofen for pain, warm salt water gurgles. If not better/symptoms change after 7 days let us know.  - POCT rapid strep A - Cytology (oral, anal, urethral) ancillary only; Future     Return if symptoms worsen or fail to improve.    Orland Mustard, MD [facility]   04/29/2018

## 2018-04-30 LAB — CYTOLOGY, (ORAL, ANAL, URETHRAL) ANCILLARY ONLY
Chlamydia: NEGATIVE
Neisseria Gonorrhea: NEGATIVE

## 2018-05-31 NOTE — Progress Notes (Signed)
Ms. Kohrs received her flu shot to her RT deltoid today at the [facility] by Gean Maidens, RN [phone] NDC:[id number] VWU:[id number] Exp:01/18/19

## 2018-06-15 ENCOUNTER — Encounter: Payer: Self-pay | Admitting: Family Medicine

## 2018-06-15 DIAGNOSIS — Z113 Encounter for screening for infections with a predominantly sexual mode of transmission: Secondary | ICD-10-CM | POA: Diagnosis not present

## 2018-06-15 DIAGNOSIS — N760 Acute vaginitis: Secondary | ICD-10-CM | POA: Diagnosis not present

## 2018-06-15 DIAGNOSIS — R102 Pelvic and perineal pain: Secondary | ICD-10-CM | POA: Diagnosis not present

## 2018-06-15 DIAGNOSIS — M545 Low back pain: Secondary | ICD-10-CM | POA: Diagnosis not present

## 2018-06-15 DIAGNOSIS — R82998 Other abnormal findings in urine: Secondary | ICD-10-CM | POA: Diagnosis not present

## 2018-06-15 DIAGNOSIS — N921 Excessive and frequent menstruation with irregular cycle: Secondary | ICD-10-CM | POA: Diagnosis not present

## 2018-06-15 LAB — LAB REPORT - SCANNED
Chlamydia, Swab/Urine, PCR: NEGATIVE
N gonorrhoeae: NEGATIVE

## 2018-06-21 ENCOUNTER — Encounter: Payer: Self-pay | Admitting: Family Medicine

## 2018-09-06 ENCOUNTER — Encounter: Payer: Self-pay | Admitting: Family Medicine

## 2018-09-06 ENCOUNTER — Ambulatory Visit (INDEPENDENT_AMBULATORY_CARE_PROVIDER_SITE_OTHER): Payer: 59 | Admitting: Family Medicine

## 2018-09-06 VITALS — BP 108/74 | HR 86 | Temp 97.8°F | Ht 64.0 in | Wt 149.6 lb

## 2018-09-06 DIAGNOSIS — Z6825 Body mass index (BMI) 25.0-25.9, adult: Secondary | ICD-10-CM

## 2018-09-06 DIAGNOSIS — E119 Type 2 diabetes mellitus without complications: Secondary | ICD-10-CM | POA: Diagnosis not present

## 2018-09-06 DIAGNOSIS — E785 Hyperlipidemia, unspecified: Secondary | ICD-10-CM

## 2018-09-06 DIAGNOSIS — E1169 Type 2 diabetes mellitus with other specified complication: Secondary | ICD-10-CM

## 2018-09-06 DIAGNOSIS — L853 Xerosis cutis: Secondary | ICD-10-CM

## 2018-09-06 LAB — POCT GLYCOSYLATED HEMOGLOBIN (HGB A1C): Hemoglobin A1C: 6 % — AB (ref 4.0–5.6)

## 2018-09-06 LAB — GLUCOSE, POCT (MANUAL RESULT ENTRY): POC Glucose: 126 mg/dl — AB (ref 70–99)

## 2018-09-06 MED ORDER — BLOOD GLUCOSE MONITOR KIT: PACK | 0 refills | Status: DC

## 2018-09-06 MED ORDER — ONETOUCH DELICA LANCETS 33G MISC: 2 refills | Status: DC

## 2018-09-06 MED ORDER — TRIAMCINOLONE ACETONIDE 0.1 % EX OINT: 1.0000 "application " | TOPICAL_OINTMENT | Freq: Two times a day (BID) | CUTANEOUS | 0 refills | Status: DC

## 2018-09-06 MED ORDER — GLUCOSE BLOOD VI STRP: ORAL_STRIP | 3 refills | Status: DC

## 2018-09-06 MED ORDER — METFORMIN HCL 500 MG PO TABS: 500.0000 mg | ORAL_TABLET | Freq: Every evening | ORAL | 3 refills | Status: DC

## 2018-09-06 NOTE — Progress Notes (Signed)
   Chief Complaint:  Selena Holt is a 48 y.o. female who presents today with a chief complaint of T2DM.   Assessment/Plan:  T2DM (type 2 diabetes mellitus) (HCC) A1c stable at 6.0.  Her home glucometer is reading about 20 points higher with than ours - this is the most likely explanation for her elevated readings at home.  We will give prescription for new glucometer with supplies.  We will continue metformin 500 mg once daily.  She will follow-up with me in about 5 to 6 months for her physical with blood work.  Discussed reasons to return to care earlier.  Dyslipidemia associated with type 2 diabetes mellitus (HCC) Stable.  Check lipid panel next blood draw.  Lip Lesion Consistent with eczema versus xerosis cutis.  Doubt cold sores.  Will start low-dose topical triamcinolone ointment.  Discussed reasons to return to care.  Follow-up as needed.  BMI 25 Discussed lifestyle modifications including importance of healthy diet and regular exercise.  Preventative Healthcare Patient was instructed to return soon for CPE. Health Maintenance Due  Topic Date Due  . OPHTHALMOLOGY EXAM  02/28/2018  . FOOT EXAM  08/26/2018  . URINE MICROALBUMIN  08/26/2018     Subjective:  HPI:  # T2DM - On metformin 500mg  daily and tolerating well - Home sugars have in the 100s to 200s - Does not exercise regularly. - ROS: No reported polyuria or polydipsia.  # Dyslipidemia - Diet controlled off medications - ROS:  Lip Lesion Started a few months ago. Located on upper lips.  Has used valtrex in the past for cold sores.  No pain to the area.  No obvious precipitating events. Tried hydrocotisone and carmex which did not help.  No recent illnesses.  ROS: Per HPI  PMH: She reports that she has never smoked. She has never used smokeless tobacco. She reports that she does not drink alcohol or use drugs.      Objective:  Physical Exam: BP 108/74 (BP Location: Left Arm, Patient Position: Sitting,  Cuff Size: Normal)   Pulse 86   Temp 97.8 F (36.6 C) (Oral)   Ht 5\' 4"  (1.626 m)   Wt 149 lb 9.6 oz (67.9 kg)   SpO2 99%   BMI 25.68 kg/m   Wt Readings from Last 3 Encounters:  09/06/18 149 lb 9.6 oz (67.9 kg)  04/29/18 146 lb 6.4 oz (66.4 kg)  02/22/18 145 lb 9.6 oz (66 kg)  Gen: NAD, resting comfortably CV: Regular rate and rhythm with no murmurs appreciated Pulm: Normal work of breathing, clear to auscultation bilaterally with no crackles, wheezes, or rhonchi Skin: Faintly erythematous rash involving upper margin of vermilion border with overlying scale.  Results for orders placed or performed in visit on 09/06/18 (from the past 24 hour(s))  POCT glycosylated hemoglobin (Hb A1C)     Status: Abnormal   Collection Time: 09/06/18 10:00 AM  Result Value Ref Range   Hemoglobin A1C 6.0 (A) 4.0 - 5.6 %  POCT glucose (manual entry)     Status: Abnormal   Collection Time: 09/06/18 10:27 AM  Result Value Ref Range   POC Glucose 126 (A) 70 - 99 mg/dl        Caleb M. [name], MD 09/06/2018 11:11 AM

## 2018-09-06 NOTE — Assessment & Plan Note (Signed)
Stable.  Check lipid panel next blood draw.

## 2018-09-06 NOTE — Assessment & Plan Note (Addendum)
A1c stable at 6.0.  Her home glucometer is reading about 20 points higher with than ours - this is the most likely explanation for her elevated readings at home.  We will give prescription for new glucometer with supplies.  We will continue metformin 500 mg once daily.  She will follow-up with me in about 5 to 6 months for her physical with blood work.  Discussed reasons to return to care earlier.

## 2018-09-06 NOTE — Patient Instructions (Addendum)
It was very nice to see you today!  Please use the triamcinolone for your lips.  Your glucometer is reading about 20 points higher than ours.  I will get a prescription for you to get a new glucometer.  Please come back in about 5-6 months for your physical with blood work, or sooner as needed  Take care, Dr [name]

## 2018-10-02 ENCOUNTER — Ambulatory Visit (INDEPENDENT_AMBULATORY_CARE_PROVIDER_SITE_OTHER): Payer: 59 | Admitting: Family Medicine

## 2018-10-02 ENCOUNTER — Other Ambulatory Visit: Payer: Self-pay

## 2018-10-02 VITALS — BP 124/84 | HR 95 | Temp 98.9°F | Ht 64.0 in | Wt 147.0 lb

## 2018-10-02 DIAGNOSIS — R51 Headache: Secondary | ICD-10-CM

## 2018-10-02 DIAGNOSIS — R519 Headache, unspecified: Secondary | ICD-10-CM

## 2018-10-02 DIAGNOSIS — R21 Rash and other nonspecific skin eruption: Secondary | ICD-10-CM

## 2018-10-02 MED ORDER — BUTALBITAL-APAP-CAFFEINE 50-325-40 MG PO TABS: 1.0000 | ORAL_TABLET | Freq: Four times a day (QID) | ORAL | 0 refills | Status: DC | PRN

## 2018-10-02 NOTE — Patient Instructions (Signed)
General Headache Without Cause A headache is pain or discomfort that is felt around the head or neck area. There are many causes and types of headaches. In some cases, the cause may not be found. Follow these instructions at home: Watch your condition for any changes. Let your doctor know about them. Take these steps to help with your condition: Managing pain      Take over-the-counter and prescription medicines only as told by your doctor.  Lie down in a dark, quiet room when you have a headache.  If told, put ice on your head and neck area: ? Put ice in a plastic bag. ? Place a towel between your skin and the bag. ? Leave the ice on for 20 minutes, 2-3 times per day.  If told, put heat on the affected area. Use the heat source that your doctor recommends, such as a moist heat pack or a heating pad. ? Place a towel between your skin and the heat source. ? Leave the heat on for 20-30 minutes. ? Remove the heat if your skin turns bright red. This is very important if you are unable to feel pain, heat, or cold. You may have a greater risk of getting burned.  Keep lights dim if bright lights bother you or make your headaches worse. Eating and drinking  Eat meals on a regular schedule.  If you drink alcohol: ? Limit how much you use to:  0-1 drink a day for women.  0-2 drinks a day for men. ? Be aware of how much alcohol is in your drink. In the [place] one drink equals one 12 oz bottle of beer (355 mL), one 5 oz glass of wine (148 mL), or one 1 oz glass of hard liquor (44 mL).  Stop drinking caffeine, or reduce how much caffeine you drink. General instructions   Keep a journal to find out if certain things bring on headaches. For example, write down: ? What you eat and drink. ? How much sleep you get. ? Any change to your diet or medicines.  Get a massage or try other ways to relax.  Limit stress.  Sit up straight. Do not tighten (tense) your muscles.  Do not use any  products that contain nicotine or tobacco. This includes cigarettes, e-cigarettes, and chewing tobacco. If you need help quitting, ask your doctor.  Exercise regularly as told by your doctor.  Get enough sleep. This often means 7-9 hours of sleep each night.  Keep all follow-up visits as told by your doctor. This is important. Contact a doctor if:  Your symptoms are not helped by medicine.  You have a headache that feels different than the other headaches.  You feel sick to your stomach (nauseous) or you throw up (vomit).  You have a fever. Get help right away if:  Your headache gets very bad quickly.  Your headache gets worse after a lot of physical activity.  You keep throwing up.  You have a stiff neck.  You have trouble seeing.  You have trouble speaking.  You have pain in the eye or ear.  Your muscles are weak or you lose muscle control.  You lose your balance or have trouble walking.  You feel like you will pass out (faint) or you pass out.  You are mixed up (confused).  You have a seizure. Summary  A headache is pain or discomfort that is felt around the head or neck area.  There are many causes and   types of headaches. In some cases, the cause may not be found.  Keep a journal to help find out what causes your headaches. Watch your condition for any changes. Let your doctor know about them.  Contact a doctor if you have a headache that is different from usual, or if your headache is not helped by medicine.  Get help right away if your headache gets very bad, you throw up, you have trouble seeing, you lose your balance, or you have a seizure. This information is not intended to replace advice given to you by your health care provider. Make sure you discuss any questions you have with your health care provider. Document Released: 04/15/2008 Document Revised: 01/25/2018 Document Reviewed: 01/25/2018 Elsevier Interactive Patient Education  2019 Elsevier  Inc.  

## 2018-10-02 NOTE — Progress Notes (Signed)
Subjective:    Patient ID: Selena Holt, female    DOB: 19-Aug-1970, 48 y.o.   MRN: [id number]  HPI   Patient resents to clinic complaining of 2 issues.  First is a headache, kind of feels achiness all over her head, seems to be worse by bright lights.  Began a few days ago and has kind of waxed and waned over the past few days does respond slightly to dose of Tylenol, but when has to go out in bright lights at her work or out in the sun, headache slowly starts to creep back.  Denies this being worst headache of her life.  Denies head injury.  Denies fever or chills.  Denies visual changes, speech difficulties, one-sided extremity weaknesses.  Second issue is a itchy rash that appeared on her left back leg yesterday, denies any new soaps detergents or lotions.  States rash seems less itchy today.  Patient Active Problem List   Diagnosis Date Noted  . Stress 01/14/2018  . Fatty liver 01/08/2018  . Sinus pressure with deviated septum 01/07/2018  . Shingles rash 07/14/2016  . Pelvic pain in female 07/19/2015  . Anemia, iron deficiency 01/03/2015  . Vitamin B 12 deficiency 03/09/2014  . Dyslipidemia associated with type 2 diabetes mellitus (HCC) 02/10/2014  . Vitamin D deficiency 02/10/2014  . T2DM (type 2 diabetes mellitus) (HCC) 11/11/2012   Social History   Tobacco Use  . Smoking status: Never Smoker  . Smokeless tobacco: Never Used  Substance Use Topics  . Alcohol use: No    Alcohol/week: 0.0 standard drinks    Comment: occ   Review of Systems  Constitutional: Negative for chills, fatigue and fever.  HENT: Negative for congestion, ear pain, sinus pain and sore throat.   Eyes: Negative.   Respiratory: Negative for cough, shortness of breath and wheezing.   Cardiovascular: Negative for chest pain, palpitations and leg swelling.  Gastrointestinal: Negative for abdominal pain, diarrhea, nausea and vomiting.  Genitourinary: Negative for dysuria, frequency and urgency.   Musculoskeletal: Negative for arthralgias and myalgias.  Skin: Negative for color change, pallor. +rash Neurological: Negative for syncope, light-headedness. +headache  Psychiatric/Behavioral: The patient is not nervous/anxious.       Objective:   Physical Exam Vitals signs and nursing note reviewed.  Constitutional:      General: She is not in acute distress.    Appearance: She is well-developed. She is not ill-appearing or toxic-appearing.  HENT:     Head: Normocephalic and atraumatic.     Right Ear: External ear normal.     Left Ear: External ear normal.     Nose: Nose normal.  Eyes:     General: No scleral icterus.       Right eye: No discharge.        Left eye: No discharge.     Extraocular Movements: Extraocular movements intact.     Conjunctiva/sclera: Conjunctivae normal.     Pupils: Pupils are equal, round, and reactive to light.  Neck:     Musculoskeletal: Normal range of motion and neck supple.     Trachea: No tracheal deviation.  Cardiovascular:     Rate and Rhythm: Normal rate and regular rhythm.     Heart sounds: Normal heart sounds.  Pulmonary:     Effort: Pulmonary effort is normal. No respiratory distress.     Breath sounds: Normal breath sounds. No wheezing, rhonchi or rales.  Musculoskeletal: Normal range of motion.  General: No deformity.  Lymphadenopathy:     Cervical: No cervical adenopathy.  Skin:    General: Skin is warm and dry.     Capillary Refill: Capillary refill takes less than 2 seconds.     Coloration: Skin is not pale.     Findings: No erythema.          Comments: Location of rash indicated by red mark on diagram.  Rash is faint only red, does not appear vesicular or pustular.  Neurological:     Mental Status: She is alert and oriented to person, place, and time.     Cranial Nerves: No cranial nerve deficit.     Motor: No weakness.     Gait: Gait normal.  Psychiatric:        Mood and Affect: Mood normal. Mood is not anxious.         Speech: Speech normal.        Behavior: Behavior normal. Behavior is not agitated.        Thought Content: Thought content normal.        Cognition and Memory: Cognition is not impaired.     Vitals:   10/02/18 0955  BP: 124/84  Pulse: 95  Temp: 98.9 F (37.2 C)  SpO2: 99%      Assessment & Plan:   Headache- possibly migraine or episodic headache exacerbated by light, patient advised to try Tylenol first if has headache, and if not effective then try dose of Fioricet.  Advised to be sure to get proper rest, proper hydration and proper nutrition.  Advised if headache worsens, develops neurological deficits like visual changes, speech difficulties, one-sided weakness, dizziness, severe intense head pain to get medical care right away.  Rash- faint red rash, could be from a contact type dermatitis.  It appears already resolving on its own.  Advised if needed, patient can apply topical over-the-counter hydrocortisone 1% cream to area.  Patient reassured that the area does not appear to look like shingles.  Patient will keep regularly scheduled follow-up PCP as planned and return to clinic sooner if any issues arise.

## 2018-10-05 ENCOUNTER — Encounter: Payer: Self-pay | Admitting: Family Medicine

## 2018-10-07 ENCOUNTER — Other Ambulatory Visit: Payer: Self-pay

## 2018-10-07 MED ORDER — VALACYCLOVIR HCL 1 G PO TABS: 1000.0000 mg | ORAL_TABLET | Freq: Three times a day (TID) | ORAL | 0 refills | Status: AC

## 2019-01-06 ENCOUNTER — Ambulatory Visit (INDEPENDENT_AMBULATORY_CARE_PROVIDER_SITE_OTHER): Payer: 59 | Admitting: Family Medicine

## 2019-01-06 ENCOUNTER — Encounter: Payer: Self-pay | Admitting: Family Medicine

## 2019-01-06 ENCOUNTER — Telehealth: Payer: Self-pay

## 2019-01-06 ENCOUNTER — Telehealth: Payer: Self-pay | Admitting: General Practice

## 2019-01-06 DIAGNOSIS — M791 Myalgia, unspecified site: Secondary | ICD-10-CM

## 2019-01-06 DIAGNOSIS — J3489 Other specified disorders of nose and nasal sinuses: Secondary | ICD-10-CM

## 2019-01-06 DIAGNOSIS — R1031 Right lower quadrant pain: Secondary | ICD-10-CM

## 2019-01-06 DIAGNOSIS — Z20822 Contact with and (suspected) exposure to covid-19: Secondary | ICD-10-CM

## 2019-01-06 MED ORDER — AZELASTINE HCL 0.1 % NA SOLN: 2.0000 | Freq: Two times a day (BID) | NASAL | 12 refills | Status: DC

## 2019-01-06 NOTE — Telephone Encounter (Signed)
Called pt to schedule covid testing. Left detailed message to return call for scheduling. Will also try to reach pt again later for scheduling.

## 2019-01-06 NOTE — Addendum Note (Signed)
Addended by: Dimple Nanas on: 01/06/2019 05:14 PM   Modules accepted: Orders

## 2019-01-06 NOTE — Progress Notes (Signed)
    Chief Complaint:  Selena Holt is a 48 y.o. female who presents today for a virtual office visit with a chief complaint of sore throat.   Assessment/Plan:  Sore throat/rhinorrhea/myalgias Concern for COVID-19.  Will send for testing.  Also possibly could represent component of allergic rhinitis.  She will continue taking her cetirizine and Flonase.  Also send in prescription for Astelin nasal spray.  Right lower quadrant abdominal pain Reassuring that symptoms seem to be improving.  Differential includes ovarian cyst versus constipation.  She does not have any peritoneal symptoms or signs of an acute abdomen at this point.  Encouraged her to start bowel regimen with MiraLAX as needed to have soft bowel of a daily.  Given that symptoms are improving, will proceed with watchful waiting.  If symptoms worsen, will need CT scan.  Discussed reasons to return to care and seek emergent care.    Subjective:  HPI:  Sore Throat Symptoms started about a week ago.  Associated with body aches and rhinorrhea.  She has not had any known sick contacts.  She has had a few episodes of sweating-see below.  No fevers or chills.  She has been using Claritin and Flonase for her allergies.  No cough or shortness of breath.  Right lower quadrant abdominal pain Patient also had sudden episode of severe right lower quadrant abdominal pain this morning.  No obvious precipitating events.  She had an episode of sweating associated with the pain.  She took Tylenol which seems to help.  She still has some pain to the area but symptoms have improved significantly over the past several hours.  She has had some associated nausea as well.  She also reports having intermittent constipation over the past several weeks.  She has never had anything similar to this in the past.  ROS: Per HPI  PMH: She reports that she has never smoked. She has never used smokeless tobacco. She reports that she does not drink alcohol or use  drugs.      Objective/Observations  Physical Exam: Gen: NAD, resting comfortably Pulm: Normal work of breathing Neuro: Grossly normal, moves all extremities Psych: Normal affect and thought content  Virtual Visit via Video   I connected with Dwyane Dee on 01/06/19 at  4:20 PM EDT by a video enabled telemedicine application and verified that I am speaking with the correct person using two identifiers. I discussed the limitations of evaluation and management by telemedicine and the availability of in person appointments. The patient expressed understanding and agreed to proceed.   Patient location: Home Provider location: [place] participating in the virtual visit: Myself and Patient     Algis Greenhouse. Jerline Pain, MD 01/06/2019 4:16 PM

## 2019-01-06 NOTE — Telephone Encounter (Signed)
Pt was scheduled for DOXY Visit see telephone note.

## 2019-01-06 NOTE — Telephone Encounter (Signed)
Pt returned my call for covid scheduling.  Scheduled with pt directly. Pt was referred by: Dimas Chyle MD

## 2019-01-06 NOTE — Telephone Encounter (Signed)
-----   Message from [place], [place] sent at 01/06/2019  3:59 PM EDT ----- Regarding: Covid Testing DOB [birth year] MRN: [id number] [facility] ID [id number]     Patient has Covid Symptoms.

## 2019-01-06 NOTE — Telephone Encounter (Signed)
Noted  

## 2019-01-07 ENCOUNTER — Other Ambulatory Visit: Payer: Self-pay

## 2019-01-07 ENCOUNTER — Ambulatory Visit: Payer: 59 | Admitting: Family Medicine

## 2019-01-07 DIAGNOSIS — Z20822 Contact with and (suspected) exposure to covid-19: Secondary | ICD-10-CM

## 2019-01-10 ENCOUNTER — Encounter: Payer: Self-pay | Admitting: Family Medicine

## 2019-01-11 ENCOUNTER — Encounter: Payer: Self-pay | Admitting: Family Medicine

## 2019-01-11 LAB — NOVEL CORONAVIRUS, NAA: SARS-CoV-2, NAA: NOT DETECTED

## 2019-02-17 ENCOUNTER — Encounter: Payer: Self-pay | Admitting: Family Medicine

## 2019-03-01 ENCOUNTER — Encounter: Payer: Self-pay | Admitting: Family Medicine

## 2019-03-26 ENCOUNTER — Encounter: Payer: Self-pay | Admitting: Family Medicine

## 2019-03-29 ENCOUNTER — Other Ambulatory Visit: Payer: Self-pay

## 2019-03-29 DIAGNOSIS — E119 Type 2 diabetes mellitus without complications: Secondary | ICD-10-CM

## 2019-03-29 MED ORDER — ONETOUCH DELICA LANCETS 33G MISC: 2 refills | Status: AC

## 2019-03-29 MED ORDER — METFORMIN HCL 500 MG PO TABS: 500.0000 mg | ORAL_TABLET | Freq: Every evening | ORAL | 3 refills | Status: DC

## 2019-03-29 MED ORDER — GLUCOSE BLOOD VI STRP: ORAL_STRIP | 3 refills | Status: DC

## 2019-04-08 ENCOUNTER — Other Ambulatory Visit: Payer: Self-pay

## 2019-04-08 ENCOUNTER — Ambulatory Visit: Payer: 59 | Admitting: Family Medicine

## 2019-04-08 DIAGNOSIS — J3489 Other specified disorders of nose and nasal sinuses: Secondary | ICD-10-CM

## 2019-04-08 DIAGNOSIS — Z20822 Contact with and (suspected) exposure to covid-19: Secondary | ICD-10-CM

## 2019-04-08 DIAGNOSIS — E119 Type 2 diabetes mellitus without complications: Secondary | ICD-10-CM

## 2019-04-08 DIAGNOSIS — E1169 Type 2 diabetes mellitus with other specified complication: Secondary | ICD-10-CM | POA: Diagnosis not present

## 2019-04-08 DIAGNOSIS — E785 Hyperlipidemia, unspecified: Secondary | ICD-10-CM

## 2019-04-08 DIAGNOSIS — E559 Vitamin D deficiency, unspecified: Secondary | ICD-10-CM | POA: Diagnosis not present

## 2019-04-08 DIAGNOSIS — E538 Deficiency of other specified B group vitamins: Secondary | ICD-10-CM

## 2019-04-08 DIAGNOSIS — D509 Iron deficiency anemia, unspecified: Secondary | ICD-10-CM

## 2019-04-08 NOTE — Assessment & Plan Note (Signed)
Chronic problem.  Stable.  Check CBC and iron panel.

## 2019-04-08 NOTE — Progress Notes (Signed)
    Chief Complaint:  Selena Holt is a 48 y.o. female who presents today for a virtual office visit with a chief complaint of post nasal drip.   Assessment/Plan:  Postnasal drip Likely allergies.  Will send for testing.  Continue using allergy medications.  Anemia, iron deficiency Chronic problem.  Stable.  Check CBC and iron panel.  Vitamin B 12 deficiency Chronic problem.  Stable.  Check B12 level.  Vitamin D deficiency Chronic problem.  Stable.  Check vitamin D level.  Dyslipidemia associated with type 2 diabetes mellitus (HCC) Check lipid panel, CBC, C met, TSH.  T2DM (type 2 diabetes mellitus) (HCC) Check A1c.  Continue metformin 500 mg daily.    Subjective:  HPI:  Post nasal drip Started about 2 days ago.  Associated with throat irritation and cough.  No fevers or chills.  No treatments tried.  Thinks it is due to seasonal allergies.  No obvious aggravating or alleviating factors.  She had some abdominal pain a few months ago that has subsided since stopping her iron.   Her stable, chronic medical conditions are outlined below:  # T2DM - On metformin 597m daily and tolerating well - Does not exercise regularly. - ROS: No reported polyuria or polydipsia.    ROS: Per HPI  PMH: She reports that she has never smoked. She has never used smokeless tobacco. She reports that she does not drink alcohol or use drugs.      Objective/Observations  Physical Exam: Gen: NAD, resting comfortably Pulm: Normal work of breathing Neuro: Grossly normal, moves all extremities Psych: Normal affect and thought content  Virtual Visit via Video   I connected with ZDwyane Deeon 04/08/19 at  8:40 AM EDT by a video enabled telemedicine application and verified that I am speaking with the correct person using two identifiers. I discussed the limitations of evaluation and management by telemedicine and the availability of in person appointments. The patient expressed  understanding and agreed to proceed.   Patient location: [place] Provider location: [place] in [place] visit: Myself and Patient     CAlgis Greenhouse PJerline Pain MD 04/08/2019 9:14 AM

## 2019-04-08 NOTE — Assessment & Plan Note (Signed)
Check lipid panel, CBC, C met, TSH.

## 2019-04-08 NOTE — Assessment & Plan Note (Signed)
Check A1c.  Continue metformin 500 mg daily. ?

## 2019-04-08 NOTE — Assessment & Plan Note (Signed)
Chronic problem.  Stable.  Check vitamin D level.

## 2019-04-08 NOTE — Assessment & Plan Note (Signed)
Chronic problem.  Stable.  Check B12 level.

## 2019-04-09 LAB — NOVEL CORONAVIRUS, NAA: SARS-CoV-2, NAA: NOT DETECTED

## 2019-04-11 NOTE — Progress Notes (Signed)
Dr Marigene Ehlers interpretation of your lab work:  Good news! Your COVID test is negative. Please call our office soon to schedule a lab visit to get your labs done.    If you have any additional questions, please give [place] a call or send [place] a message through [place].  Take care, Dr Jerline Pain

## 2019-04-12 ENCOUNTER — Encounter: Payer: Self-pay | Admitting: Family Medicine

## 2019-04-14 ENCOUNTER — Other Ambulatory Visit: Payer: Self-pay

## 2019-04-14 MED ORDER — HYDROXYZINE HCL 25 MG PO TABS: 25.0000 mg | ORAL_TABLET | Freq: Three times a day (TID) | ORAL | 1 refills | Status: DC

## 2019-04-21 ENCOUNTER — Other Ambulatory Visit: Payer: Self-pay | Admitting: Family Medicine

## 2019-04-22 ENCOUNTER — Other Ambulatory Visit: Payer: Self-pay

## 2019-04-22 ENCOUNTER — Encounter: Payer: Self-pay | Admitting: Family Medicine

## 2019-04-22 ENCOUNTER — Other Ambulatory Visit (INDEPENDENT_AMBULATORY_CARE_PROVIDER_SITE_OTHER): Payer: 59

## 2019-04-22 DIAGNOSIS — E1169 Type 2 diabetes mellitus with other specified complication: Secondary | ICD-10-CM | POA: Diagnosis not present

## 2019-04-22 DIAGNOSIS — E538 Deficiency of other specified B group vitamins: Secondary | ICD-10-CM | POA: Diagnosis not present

## 2019-04-22 DIAGNOSIS — D509 Iron deficiency anemia, unspecified: Secondary | ICD-10-CM

## 2019-04-22 DIAGNOSIS — E785 Hyperlipidemia, unspecified: Secondary | ICD-10-CM | POA: Diagnosis not present

## 2019-04-22 DIAGNOSIS — E119 Type 2 diabetes mellitus without complications: Secondary | ICD-10-CM

## 2019-04-22 DIAGNOSIS — E559 Vitamin D deficiency, unspecified: Secondary | ICD-10-CM | POA: Diagnosis not present

## 2019-04-22 LAB — LIPID PANEL
Cholesterol: 158 mg/dL (ref 0–200)
HDL: 32.3 mg/dL — ABNORMAL LOW (ref >39.00)
LDL Cholesterol: 98 mg/dL (ref 0–99)
NonHDL: 125.33
Total CHOL/HDL Ratio: 5
Triglycerides: 136 mg/dL (ref 0.0–149.0)
VLDL: 27.2 mg/dL (ref 0.0–40.0)

## 2019-04-22 LAB — CBC
HCT: 35.9 % — ABNORMAL LOW (ref 36.0–46.0)
Hemoglobin: 12.2 g/dL (ref 12.0–15.0)
MCHC: 34.1 g/dL (ref 30.0–36.0)
MCV: 93.2 fl (ref 78.0–100.0)
Platelets: 295 10*3/uL (ref 150.0–400.0)
RBC: 3.85 Mil/uL — ABNORMAL LOW (ref 3.87–5.11)
RDW: 13.2 % (ref 11.5–15.5)
WBC: 12.1 10*3/uL — ABNORMAL HIGH (ref 4.0–10.5)

## 2019-04-22 LAB — COMPREHENSIVE METABOLIC PANEL
ALT: 56 U/L — ABNORMAL HIGH (ref 0–35)
AST: 26 U/L (ref 0–37)
Albumin: 4.6 g/dL (ref 3.5–5.2)
Alkaline Phosphatase: 94 U/L (ref 39–117)
BUN: 12 mg/dL (ref 6–23)
CO2: 26 mEq/L (ref 19–32)
Calcium: 9.5 mg/dL (ref 8.4–10.5)
Chloride: 99 mEq/L (ref 96–112)
Creatinine, Ser: 0.65 mg/dL (ref 0.40–1.20)
GFR: 97.79 mL/min (ref >60.00)
Glucose, Bld: 159 mg/dL — ABNORMAL HIGH (ref 70–99)
Potassium: 4 mEq/L (ref 3.5–5.1)
Sodium: 134 mEq/L — ABNORMAL LOW (ref 135–145)
Total Bilirubin: 0.6 mg/dL (ref 0.2–1.2)
Total Protein: 6.8 g/dL (ref 6.0–8.3)

## 2019-04-22 LAB — IBC + FERRITIN
Ferritin: 75.7 ng/mL (ref 10.0–291.0)
Iron: 81 ug/dL (ref 42–145)
Saturation Ratios: 21.4 % (ref 20.0–50.0)
Transferrin: 270 mg/dL (ref 212.0–360.0)

## 2019-04-22 LAB — VITAMIN B12: Vitamin B-12: 1099 pg/mL — ABNORMAL HIGH (ref 211–911)

## 2019-04-22 LAB — TSH: TSH: 2.85 u[IU]/mL (ref 0.35–4.50)

## 2019-04-22 LAB — VITAMIN D 25 HYDROXY (VIT D DEFICIENCY, FRACTURES): VITD: 71.59 ng/mL (ref 30.00–100.00)

## 2019-04-22 LAB — HEMOGLOBIN A1C: Hgb A1c MFr Bld: 7.2 % — ABNORMAL HIGH (ref 4.6–6.5)

## 2019-04-22 MED ORDER — METFORMIN HCL 500 MG PO TABS: 500.0000 mg | ORAL_TABLET | Freq: Every evening | ORAL | 3 refills | Status: DC

## 2019-04-22 MED ORDER — ONETOUCH VERIO VI STRP: ORAL_STRIP | 3 refills | Status: DC

## 2019-04-22 NOTE — Telephone Encounter (Signed)
Copied from [place] [id number]. Topic: General - Other >> Apr 22, 2019  3:53 PM Keene Breath wrote: Reason for CRM: Patient stated she is returning a call regarding her labs.  CB# [phone]

## 2019-04-22 NOTE — Progress Notes (Signed)
Please inform patient of the following:  Hemoglobin A1c slightly elevated compared to last time. We can increase her metformin to 500mg  twice daily. Would like to have it recheck in 3 months.   Her White blood cell count was elevated. This is probably due to a recent illness. We should recheck again with her next blood draw.  Her "good" cholesterol was a bit low but everything else is NORMAL. Would not recommend any other changes to her treatment plan at this time. We can recheck everything else in a year or so.   Algis Greenhouse. Jerline Pain, MD 04/22/2019 2:41 PM

## 2019-04-25 ENCOUNTER — Encounter: Payer: Self-pay | Admitting: Family Medicine

## 2019-04-26 ENCOUNTER — Other Ambulatory Visit: Payer: Self-pay

## 2019-04-26 DIAGNOSIS — E119 Type 2 diabetes mellitus without complications: Secondary | ICD-10-CM

## 2019-04-26 MED ORDER — METFORMIN HCL 500 MG PO TABS: 500.0000 mg | ORAL_TABLET | Freq: Two times a day (BID) | ORAL | 2 refills | Status: DC

## 2019-04-27 ENCOUNTER — Encounter: Payer: Self-pay | Admitting: Family Medicine

## 2019-04-27 ENCOUNTER — Other Ambulatory Visit: Payer: 59

## 2019-05-13 ENCOUNTER — Other Ambulatory Visit: Payer: Self-pay

## 2019-05-13 ENCOUNTER — Ambulatory Visit (INDEPENDENT_AMBULATORY_CARE_PROVIDER_SITE_OTHER): Payer: 59 | Admitting: Family Medicine

## 2019-05-13 VITALS — BP 110/76 | HR 89 | Temp 98.2°F | Ht 64.0 in | Wt 148.2 lb

## 2019-05-13 DIAGNOSIS — H6981 Other specified disorders of Eustachian tube, right ear: Secondary | ICD-10-CM

## 2019-05-13 DIAGNOSIS — E119 Type 2 diabetes mellitus without complications: Secondary | ICD-10-CM

## 2019-05-13 DIAGNOSIS — H6991 Unspecified Eustachian tube disorder, right ear: Secondary | ICD-10-CM

## 2019-05-13 DIAGNOSIS — M549 Dorsalgia, unspecified: Secondary | ICD-10-CM | POA: Diagnosis not present

## 2019-05-13 MED ORDER — AZELASTINE HCL 0.1 % NA SOLN: NASAL | 12 refills | Status: DC

## 2019-05-13 NOTE — Patient Instructions (Signed)
It was very nice to see you today!  Please try the astelin.  Please work on the stretches.  Come back in a couple of months to recheck your blood sugar, or sooner if needed.   Take care, Dr Jerline Pain  Please try these tips to maintain a healthy lifestyle:   Eat at least 3 REAL meals and 1-2 snacks per day.  Aim for no more than 5 hours between eating.  If you eat breakfast, please do so within one hour of getting up.    Obtain twice as many fruits/vegetables as protein or carbohydrate foods for both lunch and dinner. (Half of each meal should be fruits/vegetables, one quarter protein, and one quarter starchy carbs)   Cut down on sweet beverages. This includes juice, soda, and sweet tea.    Exercise at least 150 minutes every week.

## 2019-05-13 NOTE — Assessment & Plan Note (Signed)
Unable to tolerate higher doses of Metformin.  We will continue 500 mg daily.  She will follow-up with me in a couple of months to recheck A1c.  Would consider addition of alternative agent instead of increasing Metformin if she is not at goal.

## 2019-05-13 NOTE — Assessment & Plan Note (Signed)
Secondary to scoliosis and kyphosis.  Will place referral to physical therapy.  Briefly discussed home exercises today.

## 2019-05-13 NOTE — Progress Notes (Signed)
   Chief Complaint:  Selena Holt is a 48 y.o. female who presents today with a chief complaint of ear pain.   Assessment/Plan:  Eustachian tube dysfunction  No red flags.  No signs of infection.  Start Astelin nasal spray.  If no improvement the next few days would consider short burst of steroids.  Discussed reasons return to care.  Follow-up as needed.  Back pain Secondary to scoliosis and kyphosis.  Will place referral to physical therapy.  Briefly discussed home exercises today.  T2DM (type 2 diabetes mellitus) ([place]) Unable to tolerate higher doses of Metformin.  We will continue 500 mg daily.  She will follow-up with me in a couple of months to recheck A1c.  Would consider addition of alternative agent instead of increasing Metformin if she is not at goal.     Subjective:  HPI:  Ear Pain Start about a week ago.  Located in right ear.  No drainage.  No hearing loss.  She has had issues with fluid in the past.  She has tried taking ibuprofen with no significant improvement.  No fevers or chills.  She is also having ongoing upper back pain.  This has been going on for several years.  She does have a history of scoliosis and kyphosis.  Symptoms seem to be worsening recently.  No reported bowel or bladder incontinence.  Patient was found to have an A1c of 7.2 earlier this month.  She was instructed to increase her Metformin to 500 mg twice daily.  Unfortunately she was not able to tolerate this due to GI side effects.  She has now taking once per day and tolerating well. Started about a week ago.    ROS: Per HPI  PMH: She reports that she has never smoked. She has never used smokeless tobacco. She reports that she does not drink alcohol or use drugs.      Objective:  Physical Exam: BP 110/76   Pulse 89   Temp 98.2 F (36.8 C)   Ht 5\' 4"  (1.626 m)   Wt 148 lb 3.2 oz (67.2 kg)   SpO2 99%   BMI 25.44 kg/m   Gen: NAD, resting comfortably HEENT: Right TM with clear  effusion.  Left TM clear. CV: Regular rate and rhythm with no murmurs appreciated Pulm: Normal work of breathing, clear to auscultation bilaterally with no crackles, wheezes, or rhonchi MSK:  -Back: No deformities.  Tender to palpation along thoracic paraspinal muscles. Skin: Warm, dry Neuro: Grossly normal, moves all extremities Psych: Normal affect and thought content      Lorrie Gargan M. Jerline Pain, MD 05/13/2019 1:27 PM

## 2019-06-27 ENCOUNTER — Ambulatory Visit: Payer: 59 | Admitting: Physical Therapy

## 2019-07-31 ENCOUNTER — Encounter: Payer: Self-pay | Admitting: Family Medicine

## 2019-08-01 NOTE — Telephone Encounter (Signed)
Notified patient to seek ED if SOB,uncontrolled fever.Set up a virtual if cold symptoms worsen. FYI

## 2019-08-02 ENCOUNTER — Encounter: Payer: Self-pay | Admitting: Family Medicine

## 2019-08-04 ENCOUNTER — Ambulatory Visit (INDEPENDENT_AMBULATORY_CARE_PROVIDER_SITE_OTHER): Payer: 59 | Admitting: Family Medicine

## 2019-08-04 ENCOUNTER — Encounter: Payer: Self-pay | Admitting: Family Medicine

## 2019-08-04 DIAGNOSIS — U071 COVID-19: Secondary | ICD-10-CM

## 2019-08-04 MED ORDER — MELOXICAM 15 MG PO TABS: 15.0000 mg | ORAL_TABLET | Freq: Every day | ORAL | 0 refills | Status: DC

## 2019-08-04 NOTE — Progress Notes (Signed)
   Selena Holt is a 49 y.o. female who presents today for a virtual office visit.  Assessment/Plan:  New/Acute Problems: COVID 19 No red flags.  Symptoms seem to be mild.  Will start meloxicam to help with myalgias.  Recommended Tylenol 1000 mg every 6-8 hours to help with fevers and hot flashes.  Courage good oral hydration.  Discussed return to work protocols per [employer].  She can return to work on 08/15/2019.    Subjective:  HPI:  Patient was screened for COVID 19 4 days ago which was positive. Was not initially having symptoms, but over the last few days has started develop hot flashes and rhinorrhea. Has been taking ibuprofen and tylenol. No shortness of breath. No chest pain. Patient is not sure where she picked it up.        Objective/Observations  Physical Exam: Gen: NAD, resting comfortably Pulm: Normal work of breathing Neuro: Grossly normal, moves all extremities Psych: Normal affect and thought content  Virtual Visit via Video   I connected with Selena Holt on 08/04/19 at 10:40 AM EST by a video enabled telemedicine application and verified that I am speaking with the correct person using two identifiers. The limitations of evaluation and management by telemedicine and the availability of in person appointments were discussed. The patient expressed understanding and agreed to proceed.   Patient location: Home Provider location: [place] [facility] [employer] Persons participating in the virtual visit: Myself and Patient     Katina Degree. [name], MD 08/04/2019 11:10 AM

## 2019-08-11 ENCOUNTER — Encounter: Payer: Self-pay | Admitting: Family Medicine

## 2019-08-15 ENCOUNTER — Encounter: Payer: Self-pay | Admitting: Family Medicine

## 2019-08-15 ENCOUNTER — Ambulatory Visit (INDEPENDENT_AMBULATORY_CARE_PROVIDER_SITE_OTHER): Payer: 59 | Admitting: Family Medicine

## 2019-08-15 DIAGNOSIS — U071 COVID-19: Secondary | ICD-10-CM

## 2019-08-15 NOTE — Progress Notes (Signed)
   Selena Holt is a 49 y.o. female who presents today for a virtual office visit.  Assessment/Plan:  New/Acute Problems: COVID 19 No red flags.  Still having quite a bit of fatigue and myalgias.  Anticipate provement over the night several weeks.  We will give work excuse for the next week.  Can continue using over-the-counter meds.  Recommended good oral hydration as well.    Subjective:  HPI: Patient seen virtually 11 days ago for COVID-19.  Symptoms have gradually improved but he was having fevers up to 3 days ago.  Still has symptoms including fatigue, weakness, diaphoresis, and myalgias.  Patient does not think she is ready to go back to work due to her fatigue.  Does not have any further fevers or chills.  No reported chest pain or shortness of breath.       Objective/Observations  Physical Exam: Gen: NAD, resting comfortably Pulm: Normal work of breathing Neuro: Grossly normal, moves all extremities Psych: Normal affect and thought content  Virtual Visit via Video   I connected with [name] on 08/15/19 at 11:40 AM EST by a video enabled telemedicine application and verified that I am speaking with the correct person using two identifiers. The limitations of evaluation and management by telemedicine and the availability of in person appointments were discussed. The patient expressed understanding and agreed to proceed.   Patient location: Home Provider location: [place] [facility] [employer] [facility] Persons participating in the virtual visit: Myself and Patient     Katina Degree. [name], MD 08/15/2019 10:07 AM

## 2019-08-17 ENCOUNTER — Encounter: Payer: Self-pay | Admitting: Family Medicine

## 2019-08-19 ENCOUNTER — Encounter: Payer: Self-pay | Admitting: Family Medicine

## 2019-08-26 ENCOUNTER — Other Ambulatory Visit: Payer: Self-pay | Admitting: Family Medicine

## 2019-09-02 ENCOUNTER — Encounter: Payer: Self-pay | Admitting: Family Medicine

## 2019-09-02 ENCOUNTER — Ambulatory Visit: Payer: 59 | Admitting: Family Medicine

## 2019-09-02 ENCOUNTER — Other Ambulatory Visit: Payer: Self-pay

## 2019-09-02 VITALS — BP 138/80 | HR 68 | Ht 64.0 in | Wt 148.5 lb

## 2019-09-02 DIAGNOSIS — R059 Cough, unspecified: Secondary | ICD-10-CM

## 2019-09-02 DIAGNOSIS — E538 Deficiency of other specified B group vitamins: Secondary | ICD-10-CM

## 2019-09-02 DIAGNOSIS — E119 Type 2 diabetes mellitus without complications: Secondary | ICD-10-CM

## 2019-09-02 DIAGNOSIS — D509 Iron deficiency anemia, unspecified: Secondary | ICD-10-CM | POA: Diagnosis not present

## 2019-09-02 DIAGNOSIS — R05 Cough: Secondary | ICD-10-CM

## 2019-09-02 DIAGNOSIS — E559 Vitamin D deficiency, unspecified: Secondary | ICD-10-CM | POA: Diagnosis not present

## 2019-09-02 LAB — IBC + FERRITIN
Ferritin: 204.5 ng/mL (ref 10.0–291.0)
Iron: 114 ug/dL (ref 42–145)
Saturation Ratios: 27.1 % (ref 20.0–50.0)
Transferrin: 300 mg/dL (ref 212.0–360.0)

## 2019-09-02 LAB — SARS-COV-2 IGG: SARS-COV-2 IgG: 0.02

## 2019-09-02 LAB — VITAMIN B12: Vitamin B-12: 833 pg/mL (ref 211–911)

## 2019-09-02 LAB — HEMOGLOBIN A1C: Hgb A1c MFr Bld: 5.9 % (ref 4.6–6.5)

## 2019-09-02 LAB — VITAMIN D 25 HYDROXY (VIT D DEFICIENCY, FRACTURES): VITD: 58.01 ng/mL (ref 30.00–100.00)

## 2019-09-02 MED ORDER — DICLOFENAC SODIUM 75 MG PO TBEC: 75.0000 mg | DELAYED_RELEASE_TABLET | Freq: Two times a day (BID) | ORAL | 0 refills | Status: DC

## 2019-09-02 NOTE — Patient Instructions (Addendum)
It was very nice to see you today!  Please wear the splint as much as possible.  Take diclofenac twice daily for the next 1 to 2 weeks.  Please try using Flonase daily to help with your nose inflammation.  We will check blood work today.  Take care, Dr [name]  Please try these tips to maintain a healthy lifestyle:   Eat at least 3 REAL meals and 1-2 snacks per day.  Aim for no more than 5 hours between eating.  If you eat breakfast, please do so within one hour of getting up.    Each meal should contain half fruits/vegetables, one quarter protein, and one quarter carbs (no bigger than a computer mouse)   Cut down on sweet beverages. This includes juice, soda, and sweet tea.     Drink at least 1 glass of water with each meal and aim for at least 8 glasses per day   Exercise at least 150 minutes every week.

## 2019-09-02 NOTE — Assessment & Plan Note (Signed)
Continue 1200 mcg daily.  Check B12 level today.

## 2019-09-02 NOTE — Assessment & Plan Note (Signed)
Check A1c today.  Continue Metformin 500 mg twice daily.

## 2019-09-02 NOTE — Assessment & Plan Note (Signed)
Check iron panel today. 

## 2019-09-02 NOTE — Assessment & Plan Note (Signed)
Check vitamin D level today 

## 2019-09-02 NOTE — Progress Notes (Signed)
   JACK MINEAU is a 49 y.o. female who presents today for an office visit.  Assessment/Plan:  New/Acute Problems: De Quervain's tenosynovitis We will place in thumb spica splint.  Start diclofenac.  Discussed home exercises and handout was given.  No improvement in the next few weeks will need follow-up with sports medicine.  Status post Covid Recommended Flonase daily for the next couple of weeks to help with sinus congestion.  Discussed that she still may have occasional aches and pains that should resolve over the next several weeks.  Chronic Problems Addressed Today: Anemia, iron deficiency Check iron panel today.  Vitamin B 12 deficiency Continue 1200 mcg daily.  Check B12 level today.  Vitamin D deficiency Check vitamin D level today.  T2DM (type 2 diabetes mellitus) (HCC) Check A1c today.  Continue Metformin 500 mg twice daily.     Subjective:  HPI:  Patient has been having right wrist pain for the past month or so.  Worse with certain motions.  Worse with twisting.  Tried ibuprofen with no significant improvement.  She works in the [employer] and perform several repetitive motions with her right hand.  She is also recovering from Covid.  Still has occasional left-sided pain.  Also some more nasal congestion as well.  Try using nasal saline with no significant improvement.  She has been tolerating Metformin 500 mg daily.  She would like to have her A1c rechecked today.  She would also like to have iron and vitamin levels rechecked as well.       Objective:  Physical Exam: BP 138/80   Pulse 68   Ht 5\' 4"  (1.626 m)   Wt 148 lb 8 oz (67.4 kg)   SpO2 96%   BMI 25.49 kg/m   Wt Readings from Last 3 Encounters:  09/02/19 148 lb 8 oz (67.4 kg)  05/13/19 148 lb 3.2 oz (67.2 kg)  10/02/18 147 lb (66.7 kg)  Gen: No acute distress, resting comfortably CV: Regular rate and rhythm with no murmurs appreciated Pulm: Normal work of breathing, clear to auscultation  bilaterally with no crackles, wheezes, or rhonchi Neuro: Grossly normal, moves all extremities Psych: Normal affect and thought content MSK: Right radial wrist tender to palpation.  Positive Finkelstein.  Neurovascular intact distally      Candiace West M. 10/04/18, MD 09/02/2019 8:34 AM

## 2019-09-05 ENCOUNTER — Encounter: Payer: Self-pay | Admitting: Family Medicine

## 2019-09-05 ENCOUNTER — Other Ambulatory Visit: Payer: Self-pay

## 2019-09-05 DIAGNOSIS — M791 Myalgia, unspecified site: Secondary | ICD-10-CM

## 2019-09-05 NOTE — Progress Notes (Signed)
Please inform patient of the following:  Blood work is all NORMAL. Do not need to make any changes at this time. Would like for her to come back in 3-6 months to recheck her blood sugar.  Katina Degree. [name], MD 09/05/2019 9:32 AM

## 2019-09-07 ENCOUNTER — Encounter: Payer: Self-pay | Admitting: Family Medicine

## 2019-09-08 ENCOUNTER — Other Ambulatory Visit: Payer: 59

## 2019-09-08 ENCOUNTER — Encounter: Payer: Self-pay | Admitting: Family Medicine

## 2019-09-09 ENCOUNTER — Other Ambulatory Visit (INDEPENDENT_AMBULATORY_CARE_PROVIDER_SITE_OTHER): Payer: 59

## 2019-09-09 ENCOUNTER — Other Ambulatory Visit: Payer: Self-pay

## 2019-09-09 ENCOUNTER — Encounter: Payer: Self-pay | Admitting: Family Medicine

## 2019-09-09 DIAGNOSIS — M791 Myalgia, unspecified site: Secondary | ICD-10-CM | POA: Diagnosis not present

## 2019-09-09 LAB — COMPREHENSIVE METABOLIC PANEL
ALT: 101 U/L — ABNORMAL HIGH (ref 0–35)
AST: 40 U/L — ABNORMAL HIGH (ref 0–37)
Albumin: 4.7 g/dL (ref 3.5–5.2)
Alkaline Phosphatase: 143 U/L — ABNORMAL HIGH (ref 39–117)
BUN: 13 mg/dL (ref 6–23)
CO2: 25 mEq/L (ref 19–32)
Calcium: 9.4 mg/dL (ref 8.4–10.5)
Chloride: 101 mEq/L (ref 96–112)
Creatinine, Ser: 0.66 mg/dL (ref 0.40–1.20)
GFR: 95.93 mL/min (ref >60.00)
Glucose, Bld: 200 mg/dL — ABNORMAL HIGH (ref 70–99)
Potassium: 4 mEq/L (ref 3.5–5.1)
Sodium: 135 mEq/L (ref 135–145)
Total Bilirubin: 0.8 mg/dL (ref 0.2–1.2)
Total Protein: 7.4 g/dL (ref 6.0–8.3)

## 2019-09-09 LAB — CBC
HCT: 35.2 % — ABNORMAL LOW (ref 36.0–46.0)
Hemoglobin: 11.7 g/dL — ABNORMAL LOW (ref 12.0–15.0)
MCHC: 33.1 g/dL (ref 30.0–36.0)
MCV: 100.4 fl — ABNORMAL HIGH (ref 78.0–100.0)
Platelets: 225 10*3/uL (ref 150.0–400.0)
RBC: 3.51 Mil/uL — ABNORMAL LOW (ref 3.87–5.11)
RDW: 17 % — ABNORMAL HIGH (ref 11.5–15.5)
WBC: 8.1 10*3/uL (ref 4.0–10.5)

## 2019-09-09 LAB — CK: Total CK: 29 U/L (ref 7–177)

## 2019-09-12 NOTE — Progress Notes (Signed)
Please inform patient of the following:  She has had an increase in her liver numbers - this is probably due to her recent bout with covid.  Would like for her to come back in 1-2 weeks to recheck to make sure the numbers are improving. Please place future orders for CBC and CMET.

## 2019-09-13 ENCOUNTER — Other Ambulatory Visit: Payer: Self-pay

## 2019-09-13 ENCOUNTER — Encounter: Payer: Self-pay | Admitting: Family Medicine

## 2019-09-13 DIAGNOSIS — R7989 Other specified abnormal findings of blood chemistry: Secondary | ICD-10-CM

## 2019-09-13 NOTE — Progress Notes (Signed)
CBC

## 2019-09-14 ENCOUNTER — Encounter: Payer: Self-pay | Admitting: Family Medicine

## 2019-09-16 ENCOUNTER — Other Ambulatory Visit: Payer: 59

## 2019-09-23 ENCOUNTER — Other Ambulatory Visit: Payer: Self-pay

## 2019-09-23 ENCOUNTER — Other Ambulatory Visit (INDEPENDENT_AMBULATORY_CARE_PROVIDER_SITE_OTHER): Payer: 59

## 2019-09-23 DIAGNOSIS — R7989 Other specified abnormal findings of blood chemistry: Secondary | ICD-10-CM

## 2019-09-23 LAB — CBC
HCT: 34.8 % — ABNORMAL LOW (ref 36.0–46.0)
Hemoglobin: 11.9 g/dL — ABNORMAL LOW (ref 12.0–15.0)
MCHC: 34.3 g/dL (ref 30.0–36.0)
MCV: 98.5 fl (ref 78.0–100.0)
Platelets: 241 10*3/uL (ref 150.0–400.0)
RBC: 3.54 Mil/uL — ABNORMAL LOW (ref 3.87–5.11)
RDW: 14.9 % (ref 11.5–15.5)
WBC: 9.9 10*3/uL (ref 4.0–10.5)

## 2019-09-23 LAB — COMPREHENSIVE METABOLIC PANEL
ALT: 74 U/L — ABNORMAL HIGH (ref 0–35)
AST: 35 U/L (ref 0–37)
Albumin: 4.4 g/dL (ref 3.5–5.2)
Alkaline Phosphatase: 104 U/L (ref 39–117)
BUN: 15 mg/dL (ref 6–23)
CO2: 25 mEq/L (ref 19–32)
Calcium: 9.5 mg/dL (ref 8.4–10.5)
Chloride: 102 mEq/L (ref 96–112)
Creatinine, Ser: 0.75 mg/dL (ref 0.40–1.20)
GFR: 82.76 mL/min (ref >60.00)
Glucose, Bld: 196 mg/dL — ABNORMAL HIGH (ref 70–99)
Potassium: 4.1 mEq/L (ref 3.5–5.1)
Sodium: 135 mEq/L (ref 135–145)
Total Bilirubin: 0.8 mg/dL (ref 0.2–1.2)
Total Protein: 6.9 g/dL (ref 6.0–8.3)

## 2019-09-26 ENCOUNTER — Encounter: Payer: Self-pay | Admitting: Family Medicine

## 2019-09-26 NOTE — Progress Notes (Signed)
Please inform patient of the following:  Liver numbers looking much better.  Think they were elevated the last time due to Covid.  Do not need to do any further testing at this time but would like for her to let us know if she is still having symptoms.  Katina Degree. [name], MD 09/26/2019 4:04 PM

## 2019-10-08 ENCOUNTER — Encounter: Payer: Self-pay | Admitting: Family Medicine

## 2019-10-10 ENCOUNTER — Encounter: Payer: Self-pay | Admitting: Family Medicine

## 2019-10-10 NOTE — Telephone Encounter (Signed)
Form printed, given to PCP for review

## 2019-10-10 NOTE — Telephone Encounter (Signed)
Please advise 

## 2019-10-13 ENCOUNTER — Ambulatory Visit (INDEPENDENT_AMBULATORY_CARE_PROVIDER_SITE_OTHER): Payer: 59 | Admitting: Family Medicine

## 2019-10-13 ENCOUNTER — Encounter: Payer: Self-pay | Admitting: Family Medicine

## 2019-10-13 ENCOUNTER — Other Ambulatory Visit: Payer: Self-pay

## 2019-10-13 VITALS — BP 126/76 | HR 98 | Temp 97.8°F | Ht 64.0 in | Wt 149.4 lb

## 2019-10-13 DIAGNOSIS — M654 Radial styloid tenosynovitis [de Quervain]: Secondary | ICD-10-CM

## 2019-10-13 DIAGNOSIS — E119 Type 2 diabetes mellitus without complications: Secondary | ICD-10-CM | POA: Diagnosis not present

## 2019-10-13 MED ORDER — PREDNISONE 50 MG PO TABS: ORAL_TABLET | ORAL | 0 refills | Status: DC

## 2019-10-13 NOTE — Patient Instructions (Signed)
It was very nice to see you today!  Please take the prednisone.  Use your splint as much as you can.  Please use ice as much as possible.  If your symptoms are not improving in the next 1 to 2 weeks, please let me know.  Take care, Dr [name]  Please try these tips to maintain a healthy lifestyle:   Eat at least 3 REAL meals and 1-2 snacks per day.  Aim for no more than 5 hours between eating.  If you eat breakfast, please do so within one hour of getting up.    Each meal should contain half fruits/vegetables, one quarter protein, and one quarter carbs (no bigger than a computer mouse)   Cut down on sweet beverages. This includes juice, soda, and sweet tea.     Drink at least 1 glass of water with each meal and aim for at least 8 glasses per day   Exercise at least 150 minutes every week.

## 2019-10-13 NOTE — Assessment & Plan Note (Signed)
Last A1c at goal.  Discussed potential for prednisone to increase blood sugars.

## 2019-10-13 NOTE — Progress Notes (Signed)
   Selena Holt is a 49 y.o. female who presents today for an office visit.  Assessment/Plan:  New/Acute Problems: De Quervain's tenosynovitis No significant improvement since last visit.  Recommended corticosteroid injection however patient inclined.  Will start course of prednisone.  She will continue with splinting.  Also recommended ice.  She may follow-up with sports medicine if not improving.  Will likely need injection if no improvement with prednisone.  Patient voiced understanding.  Chronic Problems Addressed Today: T2DM (type 2 diabetes mellitus) (HCC) Last A1c at goal.  Discussed potential for prednisone to increase blood sugars.     Subjective:  HPI:  Patient here for tenosynovitis follow-up.  Was last seen about 6 weeks ago for this.  We started her on diclofenac and put her in a splint.  Symptoms have not improved significantly since then.  She has seen sports medicine who recommended injection however she declined.  She is also given a prednisone Dosepak however should not start this yet either.  Symptoms are still troublesome.  Makes it difficult for her to perform activities at work.       Objective:  Physical Exam: BP 126/76   Pulse 98   Temp 97.8 F (36.6 C)   Ht 5\' 4"  (1.626 m)   Wt 149 lb 6.1 oz (67.8 kg)   SpO2 99%   BMI 25.64 kg/m   GEN: NAD, resting comfortably. MSK -Right arm: Tender to palpation along the extensor tendons of right thumb.  Finkelstein test positive.  Neurovascular intact distally.  Normal internal and external rotation at shoulder.     . Katina Degree, MD 10/13/2019 10:37 AM

## 2019-10-15 ENCOUNTER — Ambulatory Visit: Payer: 59

## 2019-10-16 ENCOUNTER — Ambulatory Visit: Payer: 59

## 2019-10-18 ENCOUNTER — Encounter: Payer: Self-pay | Admitting: Family Medicine

## 2019-10-22 ENCOUNTER — Ambulatory Visit: Payer: 59 | Attending: Internal Medicine

## 2019-10-22 DIAGNOSIS — Z23 Encounter for immunization: Secondary | ICD-10-CM

## 2019-10-22 NOTE — Progress Notes (Signed)
   Covid-19 Vaccination Clinic  Name:  Selena Holt    MRN: [id number] DOB: 1971/03/23  10/22/2019  [name] was observed post Covid-19 immunization for 30 minutes based on pre-vaccination screening without incident. She was provided with Vaccine Information Sheet and instruction to access the V-Safe system.   [name] was instructed to call 911 with any severe reactions post vaccine: Marland Kitchen Difficulty breathing  . Swelling of face and throat  . A fast heartbeat  . A bad rash all over body  . Dizziness and weakness   Immunizations Administered    Name Date Dose VIS Date Route   Pfizer COVID-19 Vaccine 10/22/2019  8:24 AM 0.3 mL 07/01/2019 Intramuscular   Manufacturer: [employer], [employer]   Lot: [id number]   NDC: [address]-3

## 2019-10-31 ENCOUNTER — Encounter: Payer: Self-pay | Admitting: Family Medicine

## 2019-11-01 ENCOUNTER — Other Ambulatory Visit: Payer: Self-pay

## 2019-11-01 MED ORDER — PREDNISONE 50 MG PO TABS: ORAL_TABLET | ORAL | 0 refills | Status: DC

## 2019-11-01 NOTE — Telephone Encounter (Signed)
Please advise 

## 2019-11-07 ENCOUNTER — Encounter: Payer: Self-pay | Admitting: Family Medicine

## 2019-11-16 ENCOUNTER — Ambulatory Visit: Payer: Self-pay

## 2019-11-19 ENCOUNTER — Ambulatory Visit: Payer: 59 | Attending: Internal Medicine

## 2019-11-19 DIAGNOSIS — Z23 Encounter for immunization: Secondary | ICD-10-CM

## 2019-11-19 NOTE — Progress Notes (Signed)
   Covid-19 Vaccination Clinic  Name:  Selena Holt    MRN: [id number] DOB: March 22, 1971  11/19/2019  Ms. Hinde was observed post Covid-19 immunization for 30 minutes based on pre-vaccination screening without incident. She was provided with Vaccine Information Sheet and instruction to access the V-Safe system.   Ms. Venturino was instructed to call 911 with any severe reactions post vaccine: Marland Kitchen Difficulty breathing  . Swelling of face and throat  . A fast heartbeat  . A bad rash all over body  . Dizziness and weakness   Immunizations Administered    Name Date Dose VIS Date Route   Pfizer COVID-19 Vaccine 11/19/2019  8:36 AM 0.3 mL 09/14/2018 Intramuscular   Manufacturer: [employer], [employer]   Lot: [id number]   NDC: [address]-1

## 2019-11-23 ENCOUNTER — Encounter: Payer: Self-pay | Admitting: Family Medicine

## 2019-12-09 ENCOUNTER — Encounter: Payer: Self-pay | Admitting: Family Medicine

## 2019-12-09 NOTE — Telephone Encounter (Signed)
Please advise 

## 2019-12-15 NOTE — Telephone Encounter (Signed)
Patient is calling in, in regards to this message and is asking for an update.

## 2020-01-12 ENCOUNTER — Encounter: Payer: Self-pay | Admitting: Family Medicine

## 2020-02-16 ENCOUNTER — Other Ambulatory Visit: Payer: Self-pay | Admitting: Family Medicine

## 2020-02-16 DIAGNOSIS — E119 Type 2 diabetes mellitus without complications: Secondary | ICD-10-CM

## 2020-02-23 ENCOUNTER — Ambulatory Visit (HOSPITAL_COMMUNITY)
Admission: EM | Admit: 2020-02-23 | Discharge: 2020-02-23 | Disposition: A | Discharge status: Home / Self Care | Payer: 59 | Attending: Family Medicine | Admitting: Family Medicine

## 2020-02-23 ENCOUNTER — Encounter (HOSPITAL_COMMUNITY): Payer: Self-pay | Admitting: Emergency Medicine

## 2020-02-23 ENCOUNTER — Other Ambulatory Visit: Payer: Self-pay

## 2020-02-23 DIAGNOSIS — M549 Dorsalgia, unspecified: Secondary | ICD-10-CM

## 2020-02-23 DIAGNOSIS — R002 Palpitations: Secondary | ICD-10-CM

## 2020-02-23 DIAGNOSIS — R030 Elevated blood-pressure reading, without diagnosis of hypertension: Secondary | ICD-10-CM

## 2020-02-23 NOTE — ED Notes (Signed)
EKG report given to Dr Adela Glimpse, states pt can be evaluated at [facility].

## 2020-02-23 NOTE — Discharge Instructions (Signed)
You have been seen at the [facility] today for back pain and a fluttering sensation in your chest. Your evaluation today was not suggestive of any emergent condition requiring medical intervention at this time. Your ECG (heart tracing) did not show any worrisome changes. However, some medical problems make take more time to appear. Therefore, it's very important that you pay attention to any new symptoms or worsening of your current condition.  Please proceed directly to the Emergency Department immediately should you feel worse in any way or have any of the following symptoms: chest pain, pain that spreads to your arm, neck, jaw, back or abdomen, shortness of breath, or nausea and/or vomiting.  Your blood pressure was noted to be elevated during your visit today. If you are currently taking medication for high blood pressure, please ensure you are taking this as directed. If you do not have a history of high blood pressure and your blood pressure remains persistently elevated, you may need to begin taking a medication at some point. You may return here within the next few days to recheck if unable to see your primary care provider or if do not have a one.  BP (!) 170/96   Pulse (!) 110   Temp 98.2 F (36.8 C) (Oral)   Resp 18   LMP  (LMP Unknown)   SpO2 99%

## 2020-02-23 NOTE — ED Triage Notes (Signed)
Pt presents with L sided back and rib pain, dizziness, and nausea. Reports some balance issues when walking every once in a while. States headaches are minor and some blurred vision. Denies sinus sx, fever.  States she had what felt like a flutter in her chest today around 11 that lasted 1 hr, also felt tightness in chest at same time. States was sitting at desk at work with flutter and pain occurred.   Reports taking ibuprofen regularly due to right arm pain.

## 2020-02-24 ENCOUNTER — Ambulatory Visit (INDEPENDENT_AMBULATORY_CARE_PROVIDER_SITE_OTHER): Payer: 59 | Admitting: Family Medicine

## 2020-02-24 ENCOUNTER — Other Ambulatory Visit (HOSPITAL_COMMUNITY)
Admission: RE | Admit: 2020-02-24 | Discharge: 2020-02-24 | Disposition: A | Discharge status: Home / Self Care | Payer: 59 | Source: Ambulatory Visit | Attending: Family Medicine | Admitting: Family Medicine

## 2020-02-24 ENCOUNTER — Other Ambulatory Visit: Payer: Self-pay

## 2020-02-24 ENCOUNTER — Encounter: Payer: Self-pay | Admitting: Family Medicine

## 2020-02-24 VITALS — BP 142/82 | HR 90 | Temp 98.0°F | Ht 64.0 in | Wt 149.4 lb

## 2020-02-24 DIAGNOSIS — R109 Unspecified abdominal pain: Secondary | ICD-10-CM

## 2020-02-24 DIAGNOSIS — E119 Type 2 diabetes mellitus without complications: Secondary | ICD-10-CM

## 2020-02-24 DIAGNOSIS — R1013 Epigastric pain: Secondary | ICD-10-CM | POA: Diagnosis not present

## 2020-02-24 DIAGNOSIS — N898 Other specified noninflammatory disorders of vagina: Secondary | ICD-10-CM | POA: Diagnosis not present

## 2020-02-24 DIAGNOSIS — K760 Fatty (change of) liver, not elsewhere classified: Secondary | ICD-10-CM

## 2020-02-24 LAB — POCT URINALYSIS DIPSTICK
Bilirubin, UA: NEGATIVE
Blood, UA: NEGATIVE
Glucose, UA: POSITIVE — AB
Ketones, UA: NEGATIVE
Nitrite, UA: NEGATIVE
Protein, UA: POSITIVE — AB
Spec Grav, UA: 1.02 (ref 1.010–1.025)
Urobilinogen, UA: NEGATIVE E.U./dL — AB
pH, UA: 6 (ref 5.0–8.0)

## 2020-02-24 MED ORDER — PANTOPRAZOLE SODIUM 40 MG PO TBEC: 40.0000 mg | DELAYED_RELEASE_TABLET | Freq: Every day | ORAL | 0 refills | Status: DC

## 2020-02-24 NOTE — Addendum Note (Signed)
Addended by: Ardith Dark on: 02/24/2020 11:42 AM   Modules accepted: Orders

## 2020-02-24 NOTE — Assessment & Plan Note (Signed)
Will check CMET today 

## 2020-02-24 NOTE — Patient Instructions (Signed)
It was very nice to see you today!  I think you may have a mild ulcer.  Is also possible you could be having gallstones.  We will check blood work today.  Please start the acid blocker medication.  I will check a urine sample to make sure that she did not have a urinary tract infection.  You do not have a kidney stone.  We will check a swab today to check for any possible causes.  Discharge.  Take care, Dr [name]  Please try these tips to maintain a healthy lifestyle:   Eat at least 3 REAL meals and 1-2 snacks per day.  Aim for no more than 5 hours between eating.  If you eat breakfast, please do so within one hour of getting up.    Each meal should contain half fruits/vegetables, one quarter protein, and one quarter carbs (no bigger than a computer mouse)   Cut down on sweet beverages. This includes juice, soda, and sweet tea.     Drink at least 1 glass of water with each meal and aim for at least 8 glasses per day   Exercise at least 150 minutes every week.

## 2020-02-24 NOTE — Progress Notes (Signed)
   Selena Holt is a 49 y.o. female who presents today for an office visit.  Assessment/Plan:  New/Acute Problems: Flank pain/epigastric pain UA without blood-doubt kidney stone.  Reassuring abdominal exam.  Given that symptoms worsen with food differential at this point includes PUD versus gallstones.  Will start Protonix 40 mg daily and check CBC, CMET, lipase, and H. Pylori.  Vaginal discharge Check swab today.  No signs of PID or systemic illness.  Chronic Problems Addressed Today: T2DM (type 2 diabetes mellitus) (HCC) Check A1c.  Fatty liver Will check CMET today.   Elevated BP Reading Only mildly elevated today.  She is not currently on any medications.  Likely significantly elevated at urgent care yesterday due to acute pain.  Continue home monitoring goal 140/90 or lower.  Check labs as above.    Subjective:  HPI:  Patient here with left flank pain.  Started about a week ago.  She went to urgent care yesterday.  Their notes are not available but patient reports she had a EKG done and was told she had elevated blood pressure.  She was discharged home.  Symptoms have been stable.  Pain predominantly located in the left upper quadrant and radiates into her flank.  She has not noticed any hematuria.  No dysuria.  Pain is worse after eating.  She has tried Tums and anti-gas medication which did not help.  No vomiting.  She has had mild nausea.  She is also had a new sexual partner within the last couple of months.  She has noticed a thick yellow discharge over the last week or so.  No odor.  No vaginal pain.  No lower abdominal pain.  No fevers or chills.       Objective:  Physical Exam: BP (!) 142/82   Pulse 90   Temp 98 F (36.7 C)   Ht 5\' 4"  (1.626 m)   Wt 149 lb 6.4 oz (67.8 kg)   LMP  (LMP Unknown)   SpO2 99%   BMI 25.64 kg/m   Gen: No acute distress, resting comfortably CV: Regular rate and rhythm with no murmurs appreciated Pulm: Normal work of breathing,  clear to auscultation bilaterally with no crackles, wheezes, or rhonchi GI: Soft, nontender, nondistended.  Murphy sign negative. Neuro: Grossly normal, moves all extremities Psych: Normal affect and thought content      Makenna Macaluso M. , MD 02/24/2020 11:16 AM

## 2020-02-24 NOTE — Assessment & Plan Note (Signed)
Check A1c. 

## 2020-02-25 LAB — HEMOGLOBIN A1C
Hgb A1c MFr Bld: 7.3 % of total Hgb — ABNORMAL HIGH (ref <5.7)
Mean Plasma Glucose: 163 (calc)
eAG (mmol/L): 9 (calc)

## 2020-02-25 LAB — URINE CULTURE
MICRO NUMBER:: 10797061
Result:: NO GROWTH
SPECIMEN QUALITY:: ADEQUATE

## 2020-02-25 LAB — COMPREHENSIVE METABOLIC PANEL
AG Ratio: 2 (calc) (ref 1.0–2.5)
ALT: 67 U/L — ABNORMAL HIGH (ref 6–29)
AST: 26 U/L (ref 10–35)
Albumin: 4.5 g/dL (ref 3.6–5.1)
Alkaline phosphatase (APISO): 93 U/L (ref 31–125)
BUN: 17 mg/dL (ref 7–25)
CO2: 25 mmol/L (ref 20–32)
Calcium: 9.7 mg/dL (ref 8.6–10.2)
Chloride: 105 mmol/L (ref 98–110)
Creat: 0.64 mg/dL (ref 0.50–1.10)
Globulin: 2.2 g/dL (calc) (ref 1.9–3.7)
Glucose, Bld: 160 mg/dL — ABNORMAL HIGH (ref 65–99)
Potassium: 4.1 mmol/L (ref 3.5–5.3)
Sodium: 137 mmol/L (ref 135–146)
Total Bilirubin: 0.5 mg/dL (ref 0.2–1.2)
Total Protein: 6.7 g/dL (ref 6.1–8.1)

## 2020-02-25 LAB — CBC
HCT: 36 % (ref 35.0–45.0)
Hemoglobin: 12.2 g/dL (ref 11.7–15.5)
MCH: 32.5 pg (ref 27.0–33.0)
MCHC: 33.9 g/dL (ref 32.0–36.0)
MCV: 96 fL (ref 80.0–100.0)
MPV: 10.3 fL (ref 7.5–12.5)
Platelets: 258 10*3/uL (ref 140–400)
RBC: 3.75 10*6/uL — ABNORMAL LOW (ref 3.80–5.10)
RDW: 12.9 % (ref 11.0–15.0)
WBC: 9.7 10*3/uL (ref 3.8–10.8)

## 2020-02-25 LAB — TSH: TSH: 2.97 mIU/L

## 2020-02-25 LAB — LIPASE: Lipase: 61 U/L — ABNORMAL HIGH (ref 7–60)

## 2020-02-27 ENCOUNTER — Encounter: Payer: Self-pay | Admitting: Family Medicine

## 2020-02-28 ENCOUNTER — Encounter: Payer: Self-pay | Admitting: Family Medicine

## 2020-02-28 NOTE — ED Provider Notes (Signed)
[facility] CARE CENTER   [id number] 02/23/20 Arrival Time: 1824  ASSESSMENT & PLAN:  1. Mid back pain on left side   2. Fluttering sensation of heart   3. Elevated blood pressure reading without diagnosis of hypertension      Able to ambulate here and hemodynamically stable. No indication for imaging of back at this time given no trauma and normal neurological exam. Discussed.  ECG interpreted by me: Sinus rhythm. No changes when compared to ECG dated 01/14/2018; no STEMI.. No suspcition for ACS. VSS. She is comfortable with home observation and OTC analgesics.  Encourage ROM/movement as tolerated.  Recommend:  Follow-up Information    [facility] EMERGENCY DEPARTMENT.   Specialty: Emergency Medicine Why: If symptoms worsen in any way. Contact information: [address] [id number] mc [place] [address] [phone]       Schedule an appointment as soon as possible for a visit  with Ardith Dark, MD.   Specialty: Family Medicine Why: To monitor and discuss your blood pressure. Contact information: [address] [place] [address] [phone]                Discharge Instructions     You have been seen at the [facility] today for back pain and a fluttering sensation in your chest. Your evaluation today was not suggestive of any emergent condition requiring medical intervention at this time. Your ECG (heart tracing) did not show any worrisome changes. However, some medical problems make take more time to appear. Therefore, it's very important that you pay attention to any new symptoms or worsening of your current condition.  Please proceed directly to the Emergency Department immediately should you feel worse in any way or have any of the following symptoms: chest pain, pain that spreads to your arm, neck, jaw, back or abdomen, shortness of breath, or nausea and/or vomiting.  Your blood pressure was noted to  be elevated during your visit today. If you are currently taking medication for high blood pressure, please ensure you are taking this as directed. If you do not have a history of high blood pressure and your blood pressure remains persistently elevated, you may need to begin taking a medication at some point. You may return here within the next few days to recheck if unable to see your primary care provider or if do not have a one.  BP (!) 170/96   Pulse (!) 110   Temp 98.2 F (36.8 C) (Oral)   Resp 18   LMP  (LMP Unknown)   SpO2 99%        Reviewed expectations re: course of current medical issues. Questions answered. Outlined signs and symptoms indicating need for more acute intervention. Patient verbalized understanding. After Visit Summary given.   SUBJECTIVE: History from: patient.  Selena Holt is a 49 y.o. female who presents with L-sided back pain; questions rib pain. No injury. Gradual onset over few days. Stable. Certain movements exacerbate. No assoc CP or SOB; does reports "feeling a fluttering of my heart" today; approx 1 hour; resolved; has not returned. Mild occasional headaches. Afebrile. No nasal or chest congestion. Ibuprofen helping pain when she takes. Normal PO intake without n/v. No LE edema or calf pain/swelling.  Reports no chronic steroid use, fevers, IV drug use, or recent back surgeries or procedures.  Increased blood pressure noted today. Reports that she is not treated for HTN. She reports no chest pain on exertion, no dyspnea on  exertion, no swelling of ankles, no orthostatic dizziness or lightheadedness and no orthopnea or paroxysmal nocturnal dyspnea.    OBJECTIVE:  Vitals:   02/23/20 1843 02/23/20 1857  BP: (!) 192/89 (!) 170/96  Pulse: (!) 110   Resp: 18   Temp: 98.2 F (36.8 C)   TempSrc: Oral   SpO2: 99%     General appearance: alert; no distress HEENT: [place]; AT Neck: supple with FROM; without midline tenderness CV: slight  tachycardia noted Lungs: unlabored respirations; speaks full sentences without difficulty Abdomen: soft, non-tender; non-distended Back: mild  and poorly localized tenderness to palpation over left side back and over left lower ribs; FROM at waist; bruising: none; without midline tenderness Extremities: without edema; symmetrical without gross deformities; normal ROM of bilateral LE; no calf swelling or pain Skin: warm and dry Neurologic: normal gait; normal sensation and strength of bilateral LE Psychological: alert and cooperative; normal mood and affect    Allergies  Allergen Reactions  . Tetanus Toxoids Anaphylaxis    tetanus  . Ceclor [Cefaclor] Hives  . Doxycycline Other (See Comments)    Hives  . Penicillins Hives  . Sulfa Antibiotics Hives  . Lisinopril Other (See Comments)    fatigue    Past Medical History:  Diagnosis Date  . Anemia   . Bipolar disorder (HCC)   . Collapsed lung    "spontaneous" , teenager  . Diabetes mellitus without complication (HCC)   . HSV-1 (herpes simplex virus 1) infection   . Hypertension   . Kidney stones   . Scoliosis (and kyphoscoliosis), idiopathic    Social History   Socioeconomic History  . Marital status: Single    Spouse name: Not on file  . Number of children: 2  . Years of education: Not on file  . Highest education level: Not on file  Occupational History  . Occupation: Furniture conservator/restorer: [employer]    Comment: [employer]  Tobacco Use  . Smoking status: Never Smoker  . Smokeless tobacco: Never Used  Vaping Use  . Vaping Use: Never used  Substance and Sexual Activity  . Alcohol use: No    Alcohol/week: 0.0 standard drinks    Comment: occ  . Drug use: No  . Sexual activity: Never  Other Topics Concern  . Not on file  Social History Narrative   She lives with her grown son. 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   Social Determinants of Health   Financial Resource Strain:   . Difficulty of  Paying Living Expenses:   Food Insecurity:   . Worried About Programme researcher, broadcasting/film/video in the Last Year:   . Barista in the Last Year:   Transportation Needs:   . Freight forwarder (Medical):   Marland Kitchen Lack of Transportation (Non-Medical):   Physical Activity:   . Days of Exercise per Week:   . Minutes of Exercise per Session:   Stress:   . Feeling of Stress :   Social Connections:   . Frequency of Communication with Friends and Family:   . Frequency of Social Gatherings with Friends and Family:   . Attends Religious Services:   . Active Member of Clubs or Organizations:   . Attends Banker Meetings:   Marland Kitchen Marital Status:   Intimate Partner Violence:   . Fear of Current or Ex-Partner:   . Emotionally Abused:   Marland Kitchen Physically Abused:   . Sexually Abused:    Family History  Problem  Relation Age of Onset  . Hyperlipidemia Mother   . Hypertension Mother   . Hyperlipidemia Father   . Hypertension Father   . CAD Father   . Heart disease Father   . Alzheimer's disease Maternal Grandmother 29   Past Surgical History:  Procedure Laterality Date  . CHEST TUBE INSERTION       Mardella Layman, MD 02/28/20 1024

## 2020-02-29 ENCOUNTER — Telehealth: Payer: Self-pay

## 2020-02-29 NOTE — Telephone Encounter (Signed)
Pt called wanting a response to her previous mychart message sent in. She also states her blood sugar has been in the 250's in the morning.  Upon reading her blood pressure readings I told her we needed her to speak to a nurse. She was on hold as I was waiting for the nurse to answer and pt hung up. Pt had also earlier stated she didn't have much time because she was on her lunch break.

## 2020-02-29 NOTE — Telephone Encounter (Signed)
Attempted to call patient voice mail full. 

## 2020-03-01 ENCOUNTER — Other Ambulatory Visit: Payer: Self-pay

## 2020-03-01 ENCOUNTER — Encounter: Payer: Self-pay | Admitting: Family Medicine

## 2020-03-01 ENCOUNTER — Other Ambulatory Visit (HOSPITAL_COMMUNITY)
Admission: RE | Admit: 2020-03-01 | Discharge: 2020-03-01 | Disposition: A | Discharge status: Home / Self Care | Payer: 59 | Source: Ambulatory Visit | Attending: Family Medicine | Admitting: Family Medicine

## 2020-03-01 ENCOUNTER — Ambulatory Visit (INDEPENDENT_AMBULATORY_CARE_PROVIDER_SITE_OTHER): Payer: 59 | Admitting: Family Medicine

## 2020-03-01 ENCOUNTER — Telehealth: Payer: Self-pay | Admitting: Family Medicine

## 2020-03-01 VITALS — BP 169/94 | HR 110 | Temp 98.1°F | Ht 64.0 in | Wt 149.0 lb

## 2020-03-01 DIAGNOSIS — N898 Other specified noninflammatory disorders of vagina: Secondary | ICD-10-CM | POA: Diagnosis not present

## 2020-03-01 DIAGNOSIS — E119 Type 2 diabetes mellitus without complications: Secondary | ICD-10-CM

## 2020-03-01 DIAGNOSIS — E1159 Type 2 diabetes mellitus with other circulatory complications: Secondary | ICD-10-CM | POA: Insufficient documentation

## 2020-03-01 DIAGNOSIS — I10 Essential (primary) hypertension: Secondary | ICD-10-CM

## 2020-03-01 DIAGNOSIS — I152 Hypertension secondary to endocrine disorders: Secondary | ICD-10-CM | POA: Insufficient documentation

## 2020-03-01 LAB — CERVICOVAGINAL ANCILLARY ONLY
Bacterial Vaginitis (gardnerella): POSITIVE — AB
Candida Glabrata: NEGATIVE
Candida Vaginitis: NEGATIVE
Chlamydia: NEGATIVE
Comment: NEGATIVE
Comment: NEGATIVE
Comment: NEGATIVE
Comment: NEGATIVE
Comment: NEGATIVE
Comment: NORMAL
Neisseria Gonorrhea: NEGATIVE
Trichomonas: NEGATIVE

## 2020-03-01 MED ORDER — HYDROCHLOROTHIAZIDE 25 MG PO TABS
25.0000 mg | ORAL_TABLET | Freq: Every day | ORAL | 3 refills | Status: DC
Start: 2020-03-01 — End: 2020-12-10

## 2020-03-01 MED ORDER — METFORMIN HCL 500 MG PO TABS
500.0000 mg | ORAL_TABLET | Freq: Two times a day (BID) | ORAL | 3 refills | Status: DC
Start: 2020-03-01 — End: 2021-03-12

## 2020-03-01 NOTE — Progress Notes (Signed)
**Note Selena via Obfuscation**    Selena Holt is a 49 y.o. female who presents today for an office visit.  Assessment/Plan:  New/Acute Problems: Vaginal Discharge Recollect swab today.  She will follow-up with gynecology in a couple of weeks.  No signs of PID or systemic infection.  Chronic Problems Addressed Today: T2DM (type 2 diabetes mellitus) (HCC) A1c elevated  to 7.3 likely multifactorial in setting of stress and dietary indiscretions.  We will increase Metformin to 500 mg twice daily.  Follow-up in 3 months to recheck A1c.  Essential hypertension Significantly elevated today.  Likely multifactorial with dietary discretions and work stress..  Will start HCTZ 25 mg daily.  She will continue home monitoring and check in with me in a couple weeks via mychart.      Subjective:  HPI:  Patient here for follow-up.  Seen last week for left flank pain.  Symptoms have improved.  Had labs at that time which showed elevated A1c at 7.3.  Over last few days she had some issues with vaginal spotting for a few days.  She has an IUD in place.  States that the spotting was heavier than usual and similar to previous periods.  She will be following with gynecology in a couple of weeks.  She also had some issues with vaginal discharge at our visit last week.  A swab was collected however apparently was never sent to the lab.  Symptoms are overall stable.  Her main concerns today are elevated blood pressure and elevated blood sugar.  He has had elevated sugars in the morning into the 250s.  She also had elevated blood pressure for a few weeks.  Home blood pressure readings typically in the 140s over 90s in the morning and 170s over 100s in the evening.  Occasionally has some dizziness associated with elevated blood pressure readings.  She has been on HCTZ in the past but has been several years since she had this.  She has been under quite a bit of stress recently and has been working 60+ hours weekly.  She does admit to several dietary  indiscretions and typically eats fast food for dinner.       Objective:  Physical Exam: BP (!) 169/94   Pulse (!) 110   Temp 98.1 F (36.7 C)   Ht 5\' 4"  (1.626 m)   Wt 149 lb (67.6 kg)   LMP  (LMP Unknown)   SpO2 99%   BMI 25.58 kg/m   Gen: No acute distress, resting comfortably CV: Regular rate and rhythm with no murmurs appreciated Pulm: Normal work of breathing, clear to auscultation bilaterally with no crackles, wheezes, or rhonchi Neuro: Grossly normal, moves all extremities Psych: Normal affect and thought content      Yesica Kemler M. , MD 03/01/2020 10:38 AM

## 2020-03-01 NOTE — Telephone Encounter (Signed)
Nurse Assessment Nurse: Maisie Fus, RN, Cheri Date/Time (Eastern Time): 02/29/2020 4:03:20 PM Confirm and document reason for call. If symptomatic, describe symptoms. ---Caller states her BP and blood glucose has been elevated. States she was subsequently seen at the office. States her BP elevates in the evening after work. States her blood glucose is 250's in the mornings, and 190s after dinner. States she has a headache and feels dizzy. Has the patient had close contact with a person known or suspected to have the novel coronavirus illness OR traveled / lives in area with major community spread (including international travel) in the last 14 days from the onset of symptoms? * If Asymptomatic, screen for exposure and travel within the last 14 days. ---No Does the patient have any new or worsening symptoms? ---Yes Will a triage be completed? ---Yes Related visit to physician within the last 2 weeks? ---Yes Does the PT have any chronic conditions? (i.e. diabetes, asthma, this includes High risk factors for pregnancy, etc.) ---Yes List chronic conditions. ---DMPLEASE NOTE: All timestamps contained within this report are represented as [place] Standard Time. CONFIDENTIALTY NOTICE: This fax transmission is intended only for the addressee. It contains information that is legally privileged, confidential or otherwise protected from use or disclosure. If you are not the intended recipient, you are strictly prohibited from reviewing, disclosing, copying using or disseminating any of this information or taking any action in reliance on or regarding this information. If you have received this fax in error, please notify us immediately by telephone so that we can arrange for its return to [place]. Phone: [phone], Toll-Free: [phone], Fax: [phone] Page: 2 of 3 Call Id: [id number] Nurse Assessment Is the patient pregnant or possibly pregnant? (Ask all females between the ages of 12-55) ---No Is  this a behavioral health or substance abuse call? ---No Guidelines Guideline Title Affirmed Question Affirmed Notes Nurse Date/Time (Eastern Time) Headache [1] MILD-MODERATE headache AND [2] present > 72 hours Maisie Fus, RN, Cheri 02/29/2020 4:05:53 PM Diabetes - High Blood Sugar [1] Blood glucose 240 - 300 mg/dL (50.2 - 77.4 mmol/L) AND [2] does not use insulin (e.g., not insulin-dependent; most people with type 2 diabetes) Maisie Fus, RN, Cheri 02/29/2020 4:09:06 PM Disp. Time Lamount Cohen Time) Disposition Final User 02/29/2020 4:08:46 PM SEE PCP WITHIN 3 DAYS Maisie Fus RN, Cheri 02/29/2020 4:13:13 PM Home Care Yes Maisie Fus, RN, Cheri Caller Disagree/Comply Comply Caller Understands Yes PreDisposition Go to Urgent Care/Walk-In Clinic Care Advice Given Per Guideline SEE PCP WITHIN 3 DAYS: * You need to be seen within 2 or 3 days. * ACETAMINOPHEN - REGULAR STRENGTH TYLENOL: Take 650 mg (two 325 mg pills) by mouth every 4 to 6 hours as needed. Each Regular Strength Tylenol pill has 325 mg of acetaminophen. The most you should take each day is 3,250 mg (10 pills a day). * IBUPROFEN (E.G., MOTRIN, ADVIL): Take 400 mg (two 200 mg pills) by mouth every 6 hours. The most you should take each day is 1,200 mg (six 200 mg pills), unless your doctor has told you to take more. * Before taking any medicine, read all the instructions on the package. REST: * Lie down in a dark quiet place and relax until feeling better. LOCAL COLD: * Apply a cold wet washcloth or cold pack to the forehead for 20 minutes. CALL BACK IF: * Severe headache persists over 2 hours after pain medicine * Stiff neck occurs (i.e., can't touch chin to chest) * You become worse. CARE ADVICE given per Headache (Adult) guideline. HOME  CARE: * You should be able to treat this at home. TREATMENT - LIQUIDS: * Drink at least one glass (8 oz; 240 ml) of water per hour for the next 4 hours (Reason: adequate hydration will help lower blood sugar). *  Generally, you should try to drink 6-8 glasses of water each day. CONTINUE DIABETES PILLS: * Continue taking your diabetes pills. MEASURE AND RECORD YOUR BLOOD GLUCOSE: * Measure your blood glucose before breakfast and before going to bed. * Record the results and show them to your doctor at your next office visit. EXPECTED COURSE - YOU SHOULD CALL YOUR DOCTOR WITHIN 1-3 DAYS IF: * Your blood sugar continues to be above 240 mg/dL (16.5 mmol/L). * Your blood sugar continues to be higher than the glucose goals your doctor set for you. * It has been longer than 6 months since you had an Hemoglobin A1C test. CALL BACK IF: * Blood glucose over 300 mg/dL (79.0 mmol/L), two or more times in a row. * Vomiting lasting over 4 hours or unable to drink any fluids * Rapid breathing occurs * You become worse or have more questions. CARE ADVICE given per Diabetes - High Blood Sugar (Adult) guideline.PLEASE NOTE: All timestamps contained within this report are represented as [place]

## 2020-03-01 NOTE — Assessment & Plan Note (Addendum)
Significantly elevated today.  Likely multifactorial with dietary discretions and work stress..  Will start HCTZ 25 mg daily.  She will continue home monitoring and check in with me in a couple weeks via mychart.

## 2020-03-01 NOTE — Progress Notes (Signed)
Please inform patient of the following:  Her swab from last week came back positive for BV.  This is NOT a sexually transmitted infection.  Please send in Flagyl 5 mg twice daily for the next 7 days.  We can cancel swab that was sent in today.

## 2020-03-01 NOTE — Addendum Note (Signed)
Addended by: Ardith Dark on: 03/01/2020 12:03 PM   Modules accepted: Orders

## 2020-03-01 NOTE — Patient Instructions (Signed)
It was very nice to see you today!  Please start the hydrochlorothiazide.  Please double your Metformin to 500 mg twice daily.  Please send me a message in a couple weeks to let me know how your blood pressure and blood sugars are looking.  Please come back in 3 months to recheck your A1c.  Take care, Dr [name]  Please try these tips to maintain a healthy lifestyle:   Eat at least 3 REAL meals and 1-2 snacks per day.  Aim for no more than 5 hours between eating.  If you eat breakfast, please do so within one hour of getting up.    Each meal should contain half fruits/vegetables, one quarter protein, and one quarter carbs (no bigger than a computer mouse)   Cut down on sweet beverages. This includes juice, soda, and sweet tea.     Drink at least 1 glass of water with each meal and aim for at least 8 glasses per day   Exercise at least 150 minutes every week.

## 2020-03-01 NOTE — Telephone Encounter (Signed)
Appt. Scheduled.

## 2020-03-01 NOTE — Telephone Encounter (Signed)
Not able to leave message due to voicemail full. Please advise

## 2020-03-01 NOTE — Assessment & Plan Note (Signed)
A1c elevated  to 7.3 likely multifactorial in setting of stress and dietary indiscretions.  We will increase Metformin to 500 mg twice daily.  Follow-up in 3 months to recheck A1c.

## 2020-03-02 ENCOUNTER — Encounter: Payer: Self-pay | Admitting: Family Medicine

## 2020-03-02 ENCOUNTER — Other Ambulatory Visit: Payer: Self-pay | Admitting: Family Medicine

## 2020-03-02 LAB — CERVICOVAGINAL ANCILLARY ONLY
Bacterial Vaginitis (gardnerella): NEGATIVE
Candida Glabrata: NEGATIVE
Candida Vaginitis: NEGATIVE
Chlamydia: NEGATIVE
Comment: NEGATIVE
Comment: NEGATIVE
Comment: NEGATIVE
Comment: NEGATIVE
Comment: NEGATIVE
Comment: NORMAL
Neisseria Gonorrhea: NEGATIVE
Trichomonas: NEGATIVE

## 2020-03-02 MED ORDER — METRONIDAZOLE 500 MG PO TABS
500.0000 mg | ORAL_TABLET | Freq: Two times a day (BID) | ORAL | 0 refills | Status: AC
Start: 2020-03-02 — End: 2020-03-09

## 2020-03-02 NOTE — Telephone Encounter (Signed)
Patient calling to make sure she can get something before the weekend

## 2020-03-05 NOTE — Progress Notes (Signed)
This encounter was created in error - please disregard.

## 2020-03-06 ENCOUNTER — Ambulatory Visit: Payer: 59 | Admitting: Family Medicine

## 2020-03-14 ENCOUNTER — Ambulatory Visit: Payer: 59 | Admitting: Family Medicine

## 2020-03-17 ENCOUNTER — Other Ambulatory Visit: Payer: Self-pay | Admitting: Family Medicine

## 2020-04-02 ENCOUNTER — Other Ambulatory Visit: Payer: Self-pay | Admitting: Family Medicine

## 2020-04-20 ENCOUNTER — Other Ambulatory Visit: Payer: Self-pay | Admitting: Obstetrics and Gynecology

## 2020-04-20 DIAGNOSIS — Z1231 Encounter for screening mammogram for malignant neoplasm of breast: Secondary | ICD-10-CM

## 2020-05-14 ENCOUNTER — Other Ambulatory Visit: Payer: Self-pay

## 2020-05-14 ENCOUNTER — Ambulatory Visit
Admission: RE | Admit: 2020-05-14 | Discharge: 2020-05-14 | Disposition: A | Discharge status: Home / Self Care | Payer: 59 | Source: Ambulatory Visit | Attending: Obstetrics and Gynecology | Admitting: Obstetrics and Gynecology

## 2020-05-14 DIAGNOSIS — Z1231 Encounter for screening mammogram for malignant neoplasm of breast: Secondary | ICD-10-CM

## 2020-05-17 ENCOUNTER — Other Ambulatory Visit: Payer: Self-pay | Admitting: Obstetrics and Gynecology

## 2020-05-17 DIAGNOSIS — R928 Other abnormal and inconclusive findings on diagnostic imaging of breast: Secondary | ICD-10-CM

## 2020-06-19 ENCOUNTER — Telehealth: Payer: Self-pay

## 2020-06-19 NOTE — Telephone Encounter (Signed)
Please look into the following billing question below:  Additional Comments:  Patient called in stating she is wanting to speak with someone about a billing issue. Selena Holt came in on 02/24/20 for an appointment and has a vaginal swab done, a few days later they notified patient that it was lost and needed to come back in office to repeat exam. When coming on 03/01/20 with Dr.Parker they re-did the vaginal swab again and then found the first swab and now has tried contacting billing but patient states they did not understand. Now Selena Holt has a bill from our office and [facility], reached out to patient and left a voicemail asking to give a call back so I can have her send [place] a copy of the bill.

## 2020-06-21 ENCOUNTER — Ambulatory Visit: Payer: 59 | Attending: Internal Medicine

## 2020-06-21 DIAGNOSIS — Z23 Encounter for immunization: Secondary | ICD-10-CM

## 2020-06-21 NOTE — Progress Notes (Signed)
   Covid-19 Vaccination Clinic  Name:  Selena Holt    MRN: [id number] DOB: 12-07-1970  06/21/2020  Ms. Endo was observed post Covid-19 immunization for 30 minutes based on pre-vaccination screening without incident. She was provided with Vaccine Information Sheet and instruction to access the V-Safe system.   Ms. Fennell was instructed to call 911 with any severe reactions post vaccine: Marland Kitchen Difficulty breathing  . Swelling of face and throat  . A fast heartbeat  . A bad rash all over body  . Dizziness and weakness   Immunizations Administered    Name Date Dose VIS Date Route   Pfizer COVID-19 Vaccine 06/21/2020  1:41 PM 0.3 mL 05/09/2020 Intramuscular   Manufacturer: [employer], [employer]   Lot: [id number]   NDC: [address]-8

## 2020-06-29 NOTE — Telephone Encounter (Signed)
I have sent an email to [facility] and Vickie with [facility].  Waiting on response.

## 2020-07-05 NOTE — Telephone Encounter (Signed)
Pt following up and would like to be called back and update her.

## 2020-07-06 NOTE — Telephone Encounter (Signed)
Dawn has suggest I forward to billing.    I have forward email to billing.  I am also waiting on patient to send me copies of her billing statements.

## 2020-07-12 ENCOUNTER — Other Ambulatory Visit: Payer: Self-pay | Admitting: Family Medicine

## 2020-08-10 ENCOUNTER — Other Ambulatory Visit: Payer: 59

## 2020-09-08 LAB — HM DIABETES EYE EXAM

## 2020-10-13 LAB — HM DIABETES EYE EXAM

## 2020-10-19 ENCOUNTER — Encounter: Payer: Self-pay | Admitting: Gastroenterology

## 2020-11-27 ENCOUNTER — Telehealth: Payer: Self-pay | Admitting: Family Medicine

## 2020-12-10 ENCOUNTER — Other Ambulatory Visit: Payer: Self-pay

## 2020-12-10 ENCOUNTER — Ambulatory Visit (AMBULATORY_SURGERY_CENTER): Payer: Self-pay | Admitting: *Deleted

## 2020-12-10 VITALS — Ht 64.0 in | Wt 149.0 lb

## 2020-12-10 DIAGNOSIS — Z1211 Encounter for screening for malignant neoplasm of colon: Secondary | ICD-10-CM

## 2020-12-10 MED ORDER — PEG 3350-KCL-NA BICARB-NACL 420 G PO SOLR: 4000.0000 mL | Freq: Once | ORAL | 0 refills | Status: AC

## 2020-12-10 NOTE — Progress Notes (Signed)

## 2020-12-10 NOTE — Progress Notes (Signed)
No egg or soy allergy known to patient  No issues with past sedation with any surgeries or procedures Patient denies ever being told they had issues or difficulty with intubation  No FH of Malignant Hyperthermia No diet pills per patient No home 02 use per patient  No blood thinners per patient  Pt states  issues with constipation  At times- she stool every 2-3 days- denies hard balls - uses Miralax PRN- will so a Golytely prep ( only script prep that is covered by her BCBS)  with Miralax DAILY starting 6-1 WED  No A fib or A flutter  EMMI video to pt or via MyChart  COVID 19 guidelines implemented in [place] today with Pt and RN  Pt is fully vaccinated  for Covid   Due to the COVID-19 pandemic we are asking patients to follow certain guidelines.  Pt aware of COVID protocols and [facility] guidelines

## 2020-12-24 ENCOUNTER — Telehealth: Payer: Self-pay | Admitting: Gastroenterology

## 2020-12-24 ENCOUNTER — Encounter: Payer: Self-pay | Admitting: Gastroenterology

## 2020-12-24 NOTE — Telephone Encounter (Signed)
Called patient to check on her. She has stopped vomiting and was able to keep Pedialyte and crackers down this morning.

## 2020-12-24 NOTE — Telephone Encounter (Signed)
Called patient back to explain the plan for her colonoscopy (taking zofran before preps) when she is ready to reschedule. She would like to wait a couple of months so I put in a 3 month recall.

## 2020-12-24 NOTE — Telephone Encounter (Addendum)
  Oncall Note Patient started having multiple episodes of vomiting after she started drinking the first half of split dose, she is unable even drink water. She is a diabetic.  Advised patient to check finger stick glucose level and try sips of pedialyte. If blood sugar below 70 or >250 and unable to tolerate any PO intake, will need to come to ER. Will inform Dr Myrtie Neither to reschedule the procedure

## 2020-12-24 NOTE — Telephone Encounter (Signed)
Hey Dr. Myrtie Neither... This pt called to cxl her appt for today due to her not feeling well from the prep. Thank you

## 2020-12-24 NOTE — Telephone Encounter (Signed)
When she feels ready to do so, she can reschedule and be prescribed ondansetron 4mg , Disp 2 tablets, take one 30-45 minutes before evening and AM prep doses.

## 2020-12-27 ENCOUNTER — Telehealth: Payer: Self-pay

## 2020-12-27 ENCOUNTER — Other Ambulatory Visit: Payer: Self-pay | Admitting: *Deleted

## 2020-12-27 MED ORDER — ONETOUCH VERIO VI STRP
ORAL_STRIP | 0 refills | Status: DC
Start: 2020-12-27 — End: 2020-12-27

## 2020-12-27 MED ORDER — ONETOUCH VERIO VI STRP: ORAL_STRIP | 0 refills | Status: AC

## 2020-12-27 NOTE — Telephone Encounter (Signed)
..   LAST APPOINTMENT DATE: 07/12/2020   NEXT APPOINTMENT DATE:@8 /19/2022  MEDICATION:  Test Strips for one touch verio  PHARMACY:  [facility] on [address]   Let patient know to contact pharmacy at the end of the day to make sure medication is ready.  Please notify patient to allow 48-72 hours to process  Encourage patient to contact the pharmacy for refills or they can request refills through [facility]  CLINICAL FILLS OUT ALL BELOW:   LAST REFILL:  QTY:  REFILL DATE:    OTHER COMMENTS:    Okay for refill?  Please advise

## 2020-12-27 NOTE — Telephone Encounter (Signed)
Rx send to pharmacy, patient need OV for future refills

## 2020-12-27 NOTE — Telephone Encounter (Signed)
Spoke with patient.  Did state that this got taken care of with [facility] billing.

## 2020-12-28 ENCOUNTER — Encounter: Payer: Self-pay | Admitting: Registered Nurse

## 2020-12-28 ENCOUNTER — Other Ambulatory Visit (INDEPENDENT_AMBULATORY_CARE_PROVIDER_SITE_OTHER): Payer: BLUE CROSS/BLUE SHIELD

## 2020-12-28 ENCOUNTER — Telehealth: Payer: Self-pay | Admitting: Family Medicine

## 2020-12-28 ENCOUNTER — Other Ambulatory Visit: Payer: Self-pay

## 2020-12-28 ENCOUNTER — Telehealth: Payer: Self-pay

## 2020-12-28 ENCOUNTER — Ambulatory Visit (INDEPENDENT_AMBULATORY_CARE_PROVIDER_SITE_OTHER): Payer: BLUE CROSS/BLUE SHIELD | Admitting: Registered Nurse

## 2020-12-28 VITALS — BP 132/64 | HR 93 | Temp 98.2°F | Resp 17 | Ht 64.0 in | Wt 147.2 lb

## 2020-12-28 DIAGNOSIS — R000 Tachycardia, unspecified: Secondary | ICD-10-CM | POA: Diagnosis not present

## 2020-12-28 DIAGNOSIS — K219 Gastro-esophageal reflux disease without esophagitis: Secondary | ICD-10-CM

## 2020-12-28 MED ORDER — SUCRALFATE 1 G PO TABS: 1.0000 g | ORAL_TABLET | Freq: Three times a day (TID) | ORAL | 0 refills | Status: DC

## 2020-12-28 MED ORDER — PANTOPRAZOLE SODIUM 40 MG PO TBEC: 1.0000 | DELAYED_RELEASE_TABLET | Freq: Every day | ORAL | 1 refills | Status: DC

## 2020-12-28 NOTE — Telephone Encounter (Signed)
Selena Holt would like you to call her about this patient.  - She did labs on her yesterday and she feels like you will be interested in the results.  Please call

## 2020-12-28 NOTE — Patient Instructions (Signed)
Ms Mazor -    Good to meet you. A few thoughts on what's happening.  EKG has shown some changes. None indicative of an emergency. Could be related to ischemia, arrythmia, or anemia.  Ischemia: checking on lipid panel. Referring to cardiology - hoping to get appt next week.   Arrythmia: something like Lown Maryla Sharvil Hoey syndrome or even a potassium imbalance could do this. We'll check potassium today.   Anemia: we'll check blood counts today. Concern here for GI bleed. Taking pantoprazole and sucralfate can help a bit - pleas take as instructed.   I'll follow up with labs as soon as they're back  Let me know if anything gets worse or doesn't get better.   ER visit if heart rate spikes, chest pain, worst headache of your life, or lightheaded, sudden severe fatigue   Thank you  Rich

## 2020-12-28 NOTE — Telephone Encounter (Signed)
Noted  

## 2020-12-28 NOTE — Telephone Encounter (Signed)
Noted. Agree with dispo.  Katina Degree. [name], MD 12/28/2020 8:04 AM

## 2020-12-28 NOTE — Addendum Note (Signed)
Addended byZoe Lan on: 12/28/2020 03:15 PM   Modules accepted: Orders

## 2020-12-28 NOTE — Telephone Encounter (Signed)
See note

## 2020-12-28 NOTE — Progress Notes (Signed)
Acute Office Visit  Subjective:    Patient ID: Selena Holt, female    DOB: 1971/07/04, 50 y.o.   MRN: [id number]  Chief Complaint  Patient presents with   Irregular Heart Beat    Patient states over the last couple of days her heart rate has been a little elevated but it was okay in office. She was getting a reading of 115 most of the times. Patient denied feeling any different only noticed while taking vitals.    HPI Patient is in today for tachycardia  Onset 2-3 days May 31- started pelvic floor PT for cystocele. That night had loose bowel movement June 1 - loose bowel movement in am, around noon had stomach cramps and hyperactive bowel sounds. Later that day had bm that was dark brown/black water.  June 5 - colonoscopy prep, caused projectile vomiting. Still had excessive BM with the bowel prep. June 6 - canceled colonoscopy due to incomplete prep  June 7 - shopping, felt "weird feeling" in chest, thought it was related to breathing, but persisted after leaving. HR at 115. No anxiety. After settling in car went down to 85 bpm.  Next few days labile up to 115 - generally 85ish - has dropped as low as 40 per smart watch.   Otherwise asymptomatic. Continued lability in BP No significant cv history  Past Medical History:  Diagnosis Date   Allergy    Anemia    Anxiety    Bipolar disorder ([place])    Collapsed lung    "spontaneous" , teenager   Constipation    Depression    Diabetes mellitus without complication (HCC)    Glaucoma    Heart murmur    HSV-1 (herpes simplex virus 1) infection    Hypertension    Kidney stones    Meningitis    Scoliosis (and kyphoscoliosis), idiopathic     Past Surgical History:  Procedure Laterality Date   CHEST TUBE INSERTION     TYMPANOSTOMY TUBE PLACEMENT      Family History  Problem Relation Age of Onset   Hyperlipidemia Mother    Hypertension Mother    Hyperlipidemia Father    Hypertension Father    CAD Father    Heart  disease Father    Other Brother        colostomy    Alzheimer's disease Maternal Grandmother 9   Colon cancer Neg Hx    Colon polyps Neg Hx    Esophageal cancer Neg Hx    Rectal cancer Neg Hx    Stomach cancer Neg Hx     Social History   Socioeconomic History   Marital status: Single    Spouse name: Not on file   Number of children: 2   Years of education: Not on file   Highest education level: Not on file  Occupational History   Occupation: Hydrologist: [employer]    Comment: [employer]  Tobacco Use   Smoking status: Never   Smokeless tobacco: Never  Vaping Use   Vaping Use: Never used  Substance and Sexual Activity   Alcohol use: No    Alcohol/week: 0.0 standard drinks   Drug use: No   Sexual activity: Never  Other Topics Concern   Not on file  Social History Narrative   She lives with her grown son. 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   Social Determinants of Health   Financial Resource Strain: Not on file  Food Insecurity: Not  on file  Transportation Needs: Not on file  Physical Activity: Not on file  Stress: Not on file  Social Connections: Not on file  Intimate Partner Violence: Not on file    Outpatient Medications Prior to Visit  Medication Sig Dispense Refill   blood glucose meter kit and supplies KIT Dispense based on patient and insurance preference. Use up to four times daily as directed. (FOR ICD-9 250.00, 250.01). 1 each 0   Cholecalciferol (D3 MAXIMUM STRENGTH) 5000 UNIT/ML LIQD Take 1 mL (5,000 Units total) by mouth daily with supper. (Patient taking differently: Take 5,000 Units by mouth. Pt only taking one to two times a week) 30 mL 2   Cyanocobalamin (VITAMIN B-12 PO) Take 1,200 mcg by mouth. Pt only taking twice a week     fluticasone (FLONASE) 50 MCG/ACT nasal spray Place into both nostrils daily.     glucose blood (ONETOUCH VERIO) test strip Use as instructed to check blood sugar up to 3 times daily 300 each 0    ibuprofen (ADVIL) 200 MG tablet Take 200 mg by mouth every 6 (six) hours as needed.     levonorgestrel (MIRENA) 20 MCG/24HR IUD 1 each by Intrauterine route once.     metFORMIN (GLUCOPHAGE) 500 MG tablet Take 1 tablet (500 mg total) by mouth 2 (two) times daily with a meal. 180 tablet 3   mometasone (NASONEX) 50 MCG/ACT nasal spray mometasone 50 mcg/actuation nasal spray  SPRAY 2 SPRAYS INTO EACH NOSTRIL EVERY DAY     OneTouch Delica Lancets 09N MISC Use daily as needed to check blood sugar. 100 each 2   pantoprazole (PROTONIX) 40 MG tablet TAKE 1 TABLET BY MOUTH EVERY DAY (Patient not taking: Reported on 12/10/2020) 90 tablet 1   vitamin C (ASCORBIC ACID) 500 MG tablet Take 500 mg by mouth daily.     No facility-administered medications prior to visit.    Allergies  Allergen Reactions   Tetanus Toxoids Anaphylaxis    tetanus   Ceclor [Cefaclor] Hives   Doxycycline Other (See Comments)    Hives   Penicillins Hives   Sulfa Antibiotics Hives   Lisinopril Other (See Comments)    fatigue    Review of Systems  Constitutional: Negative.   HENT: Negative.    Eyes: Negative.   Respiratory: Negative.    Cardiovascular:  Positive for palpitations. Negative for chest pain and leg swelling.  Gastrointestinal: Negative.   Endocrine: Negative.   Genitourinary: Negative.   Musculoskeletal: Negative.   Skin: Negative.   Allergic/Immunologic: Negative.   Neurological: Negative.   Hematological: Negative.   Psychiatric/Behavioral: Negative.    All other systems reviewed and are negative.     Objective:    Physical Exam Vitals and nursing note reviewed.  Constitutional:      General: She is not in acute distress.    Appearance: Normal appearance. She is not ill-appearing, toxic-appearing or diaphoretic.  Cardiovascular:     Rate and Rhythm: Normal rate and regular rhythm.     Pulses: Normal pulses.     Heart sounds: Normal heart sounds. No murmur heard.   No friction rub. No gallop.   Pulmonary:     Effort: Pulmonary effort is normal. No respiratory distress.     Breath sounds: Normal breath sounds. No stridor. No wheezing, rhonchi or rales.  Chest:     Chest wall: No tenderness.  Skin:    General: Skin is warm and dry.     Capillary Refill: Capillary refill takes less  than 2 seconds.  Neurological:     General: No focal deficit present.     Mental Status: She is alert and oriented to person, place, and time. Mental status is at baseline.  Psychiatric:        Mood and Affect: Mood normal.        Behavior: Behavior normal.        Thought Content: Thought content normal.        Judgment: Judgment normal.    BP 132/64   Pulse 93   Temp 98.2 F (36.8 C) (Temporal)   Resp 17   Ht _0  (1.626 m)   Wt 147 lb 3.2 oz (66.8 kg)   SpO2 100%   BMI 25.27 kg/m  Wt Readings from Last 3 Encounters:  12/28/20 147 lb 3.2 oz (66.8 kg)  12/10/20 149 lb (67.6 kg)  03/01/20 149 lb (67.6 kg)    There are no preventive care reminders to display for this patient.  There are no preventive care reminders to display for this patient.   Lab Results  Component Value Date   TSH 2.97 02/24/2020   Lab Results  Component Value Date   WBC 9.7 02/24/2020   HGB 12.2 02/24/2020   HCT 36.0 02/24/2020   MCV 96.0 02/24/2020   PLT 258 02/24/2020   Lab Results  Component Value Date   NA 137 02/24/2020   K 4.1 02/24/2020   CO2 25 02/24/2020   GLUCOSE 160 (H) 02/24/2020   BUN 17 02/24/2020   CREATININE 0.64 02/24/2020   BILITOT 0.5 02/24/2020   ALKPHOS 104 09/23/2019   AST 26 02/24/2020   ALT 67 (H) 02/24/2020   PROT 6.7 02/24/2020   ALBUMIN 4.4 09/23/2019   CALCIUM 9.7 02/24/2020   ANIONGAP 4 (L) 07/14/2016   GFR 82.76 09/23/2019   Lab Results  Component Value Date   CHOL 158 04/22/2019   Lab Results  Component Value Date   HDL 32.30 (L) 04/22/2019   Lab Results  Component Value Date   LDLCALC 98 04/22/2019   Lab Results  Component Value Date   TRIG 136.0  04/22/2019   Lab Results  Component Value Date   CHOLHDL 5 04/22/2019   Lab Results  Component Value Date   HGBA1C 7.3 (H) 02/24/2020       Assessment & Plan:   Problem List Items Addressed This Visit   None    No orders of the defined types were placed in this encounter.  PLAN Labs collected. Will follow up with the patient as warranted. EKG shows nonspecific ST depression and short PR. These are different from previous EKG on 02/23/20. RRR. No further changes. Concern for GI bleed given dark stool. EKG changes could be related to acute anemia.  Concern for hypokalemia given recent vomiting and diarrhea. Will check CMET Could be arrythmia. Urgent referral to cardiology Given pantoprazole and sucralfate for GERD Strict ER precautions Patient encouraged to call clinic with any questions, comments, or concerns.  Maximiano Coss, NP

## 2020-12-28 NOTE — Telephone Encounter (Signed)
Patient has an appointment today at [facility].   Nurse Assessment Nurse: Pollyann Savoy, RN, Melissa Date/Time (Eastern Time): 12/27/2020 4:35:05 PM Confirm and document reason for call. If symptomatic, describe symptoms. ---Caller states she has had an elevated heart rate for the few days. Caller states her normal HR is approx 85 and has been running 115 past few days. Denies fever, cough or cough,or vomiting. Had Colonoscopy prep Sunday-got sick from prep so had to cancel colonoscopy-was going to be a routine Colonosocpystarted after the prep. Denies anxiety. Had V/D with Colon prep. V/D resolved on Mon-denies dehydration. Does the patient have any new or worsening symptoms? ---Yes Will a triage be completed? ---Yes Related visit to physician within the last 2 weeks? ---No Does the PT have any chronic conditions? (i.e. diabetes, asthma, this includes High risk factors for pregnancy, etc.) ---Yes List chronic conditions. ---DM Type 2. Is the patient pregnant or possibly pregnant? (Ask all females between the ages of 49-55) ---No Is this a behavioral health or substance abuse call? ---No PLEASE NOTE: All timestamps contained within this report are represented as [place] Standard Time. CONFIDENTIALTY NOTICE: This fax transmission is intended only for the addressee. It contains information that is legally privileged, confidential or otherwise protected from use or disclosure. If you are not the intended recipient, you are strictly prohibited from reviewing, disclosing, copying using or disseminating any of this information or taking any action in reliance on or regarding this information. If you have received this fax in error, please notify us immediately by telephone so that we can arrange for its return to [place]. Phone: [phone], Toll-Free: [phone], Fax: [phone] Page: 2 of 2 Call Id: [id number] Guidelines Guideline Title Affirmed Question Affirmed Notes Nurse Date/Time Lamount Cohen Time) Heart  Rate and Heartbeat Questions Palpitations Pollyann Savoy, RN, Melissa 12/27/2020 4:38:26 PM Disp. Time Lamount Cohen Time) Disposition Final User 12/27/2020 4:42:27 PM SEE PCP WITHIN 3 DAYS Yes Zayas, RN, Melissa Disposition Overriden: Home Care Override Reason: Patient's symptoms need a higher level of care Caller Disagree/Comply Comply Caller Understands Yes PreDisposition Did not know what to do Care Advice Given Per Guideline SEE PCP WITHIN 3 DAYS: * You need to be seen within 2 or 3 days. AVOID CAFFEINE: * Avoid caffeine-containing beverages. Reason: Caffeine is a stimulant and can aggravate palpitations. * Examples include coffee, tea, colas, [address], [employer], and some 'energy drinks'. LIMIT ALCOHOL: * Limit your alcohol consumption to no more than 2 drinks a day. * Ideally, eliminate alcohol entirely for the next two weeks. * Avoid diet pills. Reason: They act as stimulants. * For smokers - Stop or reduce your smoking. OTHER: CALL BACK IF: * Chest pain, lightheadedness or difficulty breathing occurs * Heart beating over 140 beats / minute * More than 3 extra or skipped beats / minute * You become worse CARE ADVICE given per Heart Rate and Heartbeat Questions (Adult) guideline. Comments User: Susa Loffler, RN Date/Time Lamount Cohen Time): 12/27/2020 4:46:29 PM Due to pt's voiced concern regarding HR and request for appt, this nurse elevated dispo and sent pt to appt line at office. Referrals REFERRED TO PCP OFFICE

## 2020-12-29 ENCOUNTER — Emergency Department (HOSPITAL_BASED_OUTPATIENT_CLINIC_OR_DEPARTMENT_OTHER): Payer: BLUE CROSS/BLUE SHIELD | Admitting: Radiology

## 2020-12-29 ENCOUNTER — Encounter (HOSPITAL_BASED_OUTPATIENT_CLINIC_OR_DEPARTMENT_OTHER): Payer: Self-pay | Admitting: Emergency Medicine

## 2020-12-29 ENCOUNTER — Encounter: Payer: Self-pay | Admitting: Registered Nurse

## 2020-12-29 ENCOUNTER — Other Ambulatory Visit: Payer: Self-pay

## 2020-12-29 DIAGNOSIS — R1084 Generalized abdominal pain: Secondary | ICD-10-CM | POA: Insufficient documentation

## 2020-12-29 DIAGNOSIS — R112 Nausea with vomiting, unspecified: Secondary | ICD-10-CM | POA: Insufficient documentation

## 2020-12-29 DIAGNOSIS — E1169 Type 2 diabetes mellitus with other specified complication: Secondary | ICD-10-CM | POA: Insufficient documentation

## 2020-12-29 DIAGNOSIS — E785 Hyperlipidemia, unspecified: Secondary | ICD-10-CM | POA: Diagnosis not present

## 2020-12-29 DIAGNOSIS — R1010 Upper abdominal pain, unspecified: Secondary | ICD-10-CM | POA: Insufficient documentation

## 2020-12-29 DIAGNOSIS — Z7984 Long term (current) use of oral hypoglycemic drugs: Secondary | ICD-10-CM | POA: Diagnosis not present

## 2020-12-29 DIAGNOSIS — R079 Chest pain, unspecified: Secondary | ICD-10-CM | POA: Diagnosis not present

## 2020-12-29 DIAGNOSIS — R002 Palpitations: Secondary | ICD-10-CM | POA: Diagnosis not present

## 2020-12-29 DIAGNOSIS — E1139 Type 2 diabetes mellitus with other diabetic ophthalmic complication: Secondary | ICD-10-CM | POA: Insufficient documentation

## 2020-12-29 LAB — CBC
HCT: 37.3 % (ref 36.0–46.0)
Hemoglobin: 13 g/dL (ref 12.0–15.0)
MCH: 32.1 pg (ref 26.0–34.0)
MCHC: 34.9 g/dL (ref 30.0–36.0)
MCV: 92.1 fL (ref 80.0–100.0)
Platelets: 321 10*3/uL (ref 150–400)
RBC: 4.05 MIL/uL (ref 3.87–5.11)
RDW: 13.4 % (ref 11.5–15.5)
WBC: 12.5 10*3/uL — ABNORMAL HIGH (ref 4.0–10.5)
nRBC: 0 % (ref 0.0–0.2)

## 2020-12-29 LAB — BASIC METABOLIC PANEL
Anion gap: 12 (ref 5–15)
BUN: 13 mg/dL (ref 6–20)
CO2: 24 mmol/L (ref 22–32)
Calcium: 10 mg/dL (ref 8.9–10.3)
Chloride: 101 mmol/L (ref 98–111)
Creatinine, Ser: 0.67 mg/dL (ref 0.44–1.00)
GFR, Estimated: >60 mL/min (ref >60)
Glucose, Bld: 138 mg/dL — ABNORMAL HIGH (ref 70–99)
Potassium: 4 mmol/L (ref 3.5–5.1)
Sodium: 137 mmol/L (ref 135–145)

## 2020-12-29 LAB — TSH: TSH: 2.71 mIU/L

## 2020-12-29 LAB — COMPREHENSIVE METABOLIC PANEL
AG Ratio: 2 (calc) (ref 1.0–2.5)
ALT: 32 U/L — ABNORMAL HIGH (ref 6–29)
AST: 22 U/L (ref 10–35)
Albumin: 4.7 g/dL (ref 3.6–5.1)
Alkaline phosphatase (APISO): 81 U/L (ref 31–125)
BUN: 16 mg/dL (ref 7–25)
CO2: 24 mmol/L (ref 20–32)
Calcium: 10.4 mg/dL — ABNORMAL HIGH (ref 8.6–10.2)
Chloride: 100 mmol/L (ref 98–110)
Creat: 0.84 mg/dL (ref 0.50–1.10)
Globulin: 2.4 g/dL (calc) (ref 1.9–3.7)
Glucose, Bld: 111 mg/dL — ABNORMAL HIGH (ref 65–99)
Potassium: 4 mmol/L (ref 3.5–5.3)
Sodium: 137 mmol/L (ref 135–146)
Total Bilirubin: 0.6 mg/dL (ref 0.2–1.2)
Total Protein: 7.1 g/dL (ref 6.1–8.1)

## 2020-12-29 LAB — CBC WITH DIFFERENTIAL/PLATELET
Absolute Monocytes: 644 cells/uL (ref 200–950)
Basophils Absolute: 45 cells/uL (ref 0–200)
Basophils Relative: 0.4 %
Eosinophils Absolute: 124 cells/uL (ref 15–500)
Eosinophils Relative: 1.1 %
HCT: 36.6 % (ref 35.0–45.0)
Hemoglobin: 12.7 g/dL (ref 11.7–15.5)
Lymphs Abs: 3288 cells/uL (ref 850–3900)
MCH: 33 pg (ref 27.0–33.0)
MCHC: 34.7 g/dL (ref 32.0–36.0)
MCV: 95.1 fL (ref 80.0–100.0)
MPV: 10.4 fL (ref 7.5–12.5)
Monocytes Relative: 5.7 %
Neutro Abs: 7198 cells/uL (ref 1500–7800)
Neutrophils Relative %: 63.7 %
Platelets: 335 10*3/uL (ref 140–400)
RBC: 3.85 10*6/uL (ref 3.80–5.10)
RDW: 12.9 % (ref 11.0–15.0)
Total Lymphocyte: 29.1 %
WBC: 11.3 10*3/uL — ABNORMAL HIGH (ref 3.8–10.8)

## 2020-12-29 LAB — LIPID PANEL
Cholesterol: 192 mg/dL (ref <200)
HDL: 32 mg/dL — ABNORMAL LOW (ref >=50)
LDL Cholesterol (Calc): 116 mg/dL (calc) — ABNORMAL HIGH
Non-HDL Cholesterol (Calc): 160 mg/dL (calc) — ABNORMAL HIGH (ref <130)
Total CHOL/HDL Ratio: 6 (calc) — ABNORMAL HIGH (ref <5.0)
Triglycerides: 288 mg/dL — ABNORMAL HIGH (ref <150)

## 2020-12-29 LAB — PREGNANCY, URINE: Preg Test, Ur: NEGATIVE

## 2020-12-29 LAB — TROPONIN I (HIGH SENSITIVITY): Troponin I (High Sensitivity): 2 ng/L (ref <18)

## 2020-12-29 NOTE — ED Triage Notes (Signed)
Pt. complains of constant chest pain that started 1730 today )pt pointed to epigastric region) . Pt also reports pain across the center of her back.   Pt also has abdominal issue related to getting sick from prep for a colonoscopy that was supposed to have been completed Monday of last week (June 6) but was cancelled due to N/V.   Pt went to PCP related to same mentioned above and PCP told her to see her cardiologist but it was not emergent.  Pt currently denis N/V.

## 2020-12-30 ENCOUNTER — Encounter (HOSPITAL_BASED_OUTPATIENT_CLINIC_OR_DEPARTMENT_OTHER): Payer: Self-pay | Admitting: Radiology

## 2020-12-30 ENCOUNTER — Emergency Department (HOSPITAL_BASED_OUTPATIENT_CLINIC_OR_DEPARTMENT_OTHER): Payer: BLUE CROSS/BLUE SHIELD

## 2020-12-30 ENCOUNTER — Emergency Department (HOSPITAL_BASED_OUTPATIENT_CLINIC_OR_DEPARTMENT_OTHER)
Admission: EM | Admit: 2020-12-30 | Discharge: 2020-12-30 | Disposition: A | Discharge status: Home / Self Care | Payer: BLUE CROSS/BLUE SHIELD | Attending: Emergency Medicine | Admitting: Emergency Medicine

## 2020-12-30 DIAGNOSIS — R1084 Generalized abdominal pain: Secondary | ICD-10-CM

## 2020-12-30 DIAGNOSIS — R079 Chest pain, unspecified: Secondary | ICD-10-CM

## 2020-12-30 LAB — TROPONIN I (HIGH SENSITIVITY): Troponin I (High Sensitivity): 2 ng/L (ref <18)

## 2020-12-30 LAB — LIPASE, BLOOD: Lipase: 23 U/L (ref 11–51)

## 2020-12-30 MED ORDER — LIDOCAINE VISCOUS HCL 2 % MT SOLN
15.0000 mL | Freq: Once | OROMUCOSAL | Status: AC
  Administered 2020-12-30: 15 mL via ORAL
  Filled 2020-12-30: qty 15

## 2020-12-30 MED ORDER — IOHEXOL 300 MG/ML  SOLN
100.0000 mL | Freq: Once | INTRAMUSCULAR | Status: AC | PRN
  Administered 2020-12-30: 100 mL via INTRAVENOUS

## 2020-12-30 MED ORDER — ALUM & MAG HYDROXIDE-SIMETH 200-200-20 MG/5ML PO SUSP
30.0000 mL | Freq: Once | ORAL | Status: AC
  Administered 2020-12-30: 30 mL via ORAL
  Filled 2020-12-30: qty 30

## 2020-12-30 NOTE — Discharge Instructions (Addendum)
Follow-up with cardiology, as well as your primary care doctor and your gastroenterologist.  Return to ER for worsening chest pain, abdominal pain or difficulty in breathing or other new concerning symptom.

## 2020-12-30 NOTE — ED Provider Notes (Signed)
[facility] EMERGENC[facility] DEPT Provider Note   CSN: [id number] Arrival date & time: 12/29/20  1755     History Chief Complaint  Patient presents with   Chest Pain    Selena Holt is a 50 y.o. female.  Patient reports that on Monday she got sick after undergoing prep for a routine colonoscopy and ultimately had to be canceled.  States that she was very nauseous and had vomiting.  Also had her palpitations and noted her heart rate to be varying quite a bit.  Was not having any associated chest pain or difficulty breathing.  Her primary doctor referred her to cardiology and she has an appointment this coming Tuesday.  She is no longer having any nausea or vomiting.  No diarrhea or constipation.  This afternoon she started having some chest pain.  Initially was relatively constant but then it seemed to migrate to her abdomen.  Pain primarily in upper abdomen, left side of abdomen.  Does not feel short of breath at present.  Aching pain.  Does not radiate to back arm or jaw.  Pain currently mild.  HPI     Past Medical History:  Diagnosis Date   Allergy    Anemia    Anxiety    Bipolar disorder ([place])    Collapsed lung    "spontaneous" , teenager   Constipation    Depression    Diabetes mellitus without complication ([place])    Glaucoma    Heart murmur    HSV-1 (herpes simplex virus 1) infection    Hypertension    Kidney stones    Meningitis    Scoliosis (and kyphoscoliosis), idiopathic     Patient Active Problem List   Diagnosis Date Noted   Essential hypertension 03/01/2020   Back pain 05/13/2019   Stress 01/14/2018   Fatty liver 01/08/2018   Anemia, iron deficiency 01/03/2015   Vitamin B 12 deficiency 03/09/2014   Dyslipidemia associated with type 2 diabetes mellitus ([place]) 02/10/2014   Vitamin D deficiency 02/10/2014   T2DM (type 2 diabetes mellitus) ([place]) 11/11/2012    Past Surgical History:  Procedure Laterality Date   CHEST TUBE INSERTION      TYMPANOSTOMY TUBE PLACEMENT       OB History   No obstetric history on file.     Family History  Problem Relation Age of Onset   Hyperlipidemia Mother    Hypertension Mother    Hyperlipidemia Father    Hypertension Father    CAD Father    Heart disease Father    Other Brother        colostomy    Alzheimer's disease Maternal Grandmother 39   Colon cancer Neg Hx    Colon polyps Neg Hx    Esophageal cancer Neg Hx    Rectal cancer Neg Hx    Stomach cancer Neg Hx     Social History   Tobacco Use   Smoking status: Never   Smokeless tobacco: Never  Vaping Use   Vaping Use: Never used  Substance Use Topics   Alcohol use: No    Alcohol/week: 0.0 standard drinks   Drug use: No    Home Medications Prior to Admission medications   Medication Sig Start Date End Date Taking? Authorizing Provider  ibuprofen (ADVIL) 200 MG tablet Take 200 mg by mouth every 6 (six) hours as needed.   Yes [provider]  metFORMIN (GLUCOPHAGE) 500 MG tablet Take 1 tablet (500 mg total) by mouth 2 (two) times  daily with a meal. 03/01/20  Yes Vivi Barrack, MD  vitamin C (ASCORBIC ACID) 500 MG tablet Take 500 mg by mouth daily.   Yes [provider]  blood glucose meter kit and supplies KIT Dispense based on patient and insurance preference. Use up to four times daily as directed. (FOR ICD-9 250.00, 250.01). 09/06/18   Vivi Barrack, MD  Cholecalciferol (D3 MAXIMUM STRENGTH) 5000 UNIT/ML LIQD Take 1 mL (5,000 Units total) by mouth daily with supper. Patient taking differently: Take 5,000 Units by mouth. Pt only taking one to two times a week 09/28/14   Nicky Pugh C, NP  Cyanocobalamin (VITAMIN B-12 PO) Take 1,200 mcg by mouth. Pt only taking twice a week    [provider]  fluticasone (FLONASE) 50 MCG/ACT nasal spray Place into both nostrils daily.    [provider]  glucose blood (ONETOUCH VERIO) test strip Use as instructed to check blood sugar up to 3 times  daily 12/27/20   Vivi Barrack, MD  levonorgestrel (MIRENA) 20 MCG/24HR IUD 1 each by Intrauterine route once.    [provider]  mometasone (NASONEX) 50 MCG/ACT nasal spray mometasone 50 mcg/actuation nasal spray  SPRAY 2 SPRAYS INTO EACH NOSTRIL EVERY DAY    [provider]  OneTouch Delica Lancets 82M MISC Use daily as needed to check blood sugar. 03/29/19   Vivi Barrack, MD  pantoprazole (PROTONIX) 40 MG tablet Take 1 tablet (40 mg total) by mouth daily. 12/28/20   Maximiano Coss, NP  sucralfate (CARAFATE) 1 g tablet Take 1 tablet (1 g total) by mouth 4 (four) times daily -  with meals and at bedtime. 12/28/20   Maximiano Coss, NP  cetirizine (ZYRTEC) 10 MG tablet Take 10 mg by mouth daily.  02/23/20  [provider]    Allergies    Tetanus toxoids, Ceclor [cefaclor], Doxycycline, Penicillins, Sulfa antibiotics, and Lisinopril  Review of Systems   Review of Systems  Constitutional:  Negative for chills and fever.  HENT:  Negative for ear pain and sore throat.   Eyes:  Negative for pain and visual disturbance.  Respiratory:  Negative for cough and shortness of breath.   Cardiovascular:  Positive for chest pain. Negative for palpitations.  Gastrointestinal:  Positive for abdominal pain. Negative for vomiting.  Genitourinary:  Negative for dysuria and hematuria.  Musculoskeletal:  Negative for arthralgias and back pain.  Skin:  Negative for color change and rash.  Neurological:  Negative for seizures and syncope.  All other systems reviewed and are negative.  Physical Exam Updated Vital Signs BP (!) 149/84   Pulse 92   Temp 98.2 F (36.8 C) (Oral)   Resp 15   Ht 5' 4"  (1.626 m)   Wt 68 kg   SpO2 97%   BMI 25.75 kg/m   Physical Exam Vitals and nursing note reviewed.  Constitutional:      General: She is not in acute distress.    Appearance: She is well-developed.  HENT:     Head: Normocephalic and atraumatic.  Eyes:     Conjunctiva/sclera:  Conjunctivae normal.  Cardiovascular:     Rate and Rhythm: Normal rate and regular rhythm.     Heart sounds: No murmur heard. Pulmonary:     Effort: Pulmonary effort is normal. No respiratory distress.     Breath sounds: Normal breath sounds.  Abdominal:     Palpations: Abdomen is soft.     Tenderness: There is no abdominal tenderness.  Comments: Some tenderness in the epigastric region, left upper quadrant and left lower quadrant  Musculoskeletal:     Cervical back: Neck supple.  Skin:    General: Skin is warm and dry.  Neurological:     Mental Status: She is alert.    ED Results / Procedures / Treatments   Labs (all labs ordered are listed, but only abnormal results are displayed) Labs Reviewed  BASIC METABOLIC PANEL - Abnormal; Notable for the following components:      Result Value   Glucose, Bld 138 (*)    All other components within normal limits  CBC - Abnormal; Notable for the following components:   WBC 12.5 (*)    All other components within normal limits  PREGNANCY, URINE  LIPASE, BLOOD  TROPONIN I (HIGH SENSITIVITY)  TROPONIN I (HIGH SENSITIVITY)    EKG None EKG at 7:08 PM shows sinus tachycardia, rate 122, nonspecific ST segment and T wave abnormality, no acute STEMI, no acute change when compared to prior EKG on 6/10  Radiology DG Chest 2 View  Result Date: 12/29/2020 CLINICAL DATA:  Chest pain. EXAM: CHEST - 2 VIEW COMPARISON:  Chest x-ray 01/14/2018, CT chest 10/30/2010 FINDINGS: The heart size and mediastinal contours are unchanged. No focal consolidation. No pulmonary edema. No pleural effusion. No pneumothorax. No acute osseous abnormality. IMPRESSION: No acute cardiopulmonary abnormality. Electronically Signed   By: Iven Finn M.D.   On: 12/29/2020 20:10   CT ABDOMEN PELVIS W CONTRAST  Result Date: 12/30/2020 CLINICAL DATA:  Left lower quadrant pain EXAM: CT ABDOMEN AND PELVIS WITH CONTRAST TECHNIQUE: Multidetector CT imaging of the abdomen  and pelvis was performed using the standard protocol following bolus administration of intravenous contrast. CONTRAST:  134m OMNIPAQUE IOHEXOL 300 MG/ML  SOLN COMPARISON:  07/19/2015 FINDINGS: Lower chest: No acute abnormality. Hepatobiliary: No focal hepatic abnormality. Gallbladder unremarkable. Pancreas: No focal abnormality or ductal dilatation. Spleen: No focal abnormality.  Normal size. Adrenals/Urinary Tract: Areas of scarring in the kidneys bilaterally. No hydronephrosis. No visible renal or ureteral stones. Adrenal glands and urinary bladder unremarkable. Stomach/Bowel: Normal appendix. Stomach, large and small bowel grossly unremarkable. Vascular/Lymphatic: Scattered aortic atherosclerosis. No evidence of aneurysm or adenopathy. Reproductive: Uterus and adnexa unremarkable. No mass. IUD within the uterus. Other: No free fluid or free air. Musculoskeletal: No acute bony abnormality. IMPRESSION: No acute findings in the abdomen or pelvis. Areas of cortical thinning and scarring in the kidneys bilaterally. No visible stones or hydronephrosis. Ace weaker H we a go good core growth a leak of Electronically Signed   By: KRolm BaptiseM.D.   On: 12/30/2020 01:32    Procedures Procedures   Medications Ordered in ED Medications  alum & mag hydroxide-simeth (MAALOX/MYLANTA) 200-200-20 MG/5ML suspension 30 mL (30 mLs Oral Given 12/30/20 0045)    And  lidocaine (XYLOCAINE) 2 % viscous mouth solution 15 mL (15 mLs Oral Given 12/30/20 0045)  iohexol (OMNIPAQUE) 300 MG/ML solution 100 mL (100 mLs Intravenous Contrast Given 12/30/20 0110)    ED Course  I have reviewed the triage vital signs and the nursing notes.  Pertinent labs & imaging results that were available during my care of the patient were reviewed by me and considered in my medical decision making (see chart for details).    MDM Rules/Calculators/A&P                          [age]year old lady presented to ER with concern for chest  pain and  abdominal pain.  On exam patient was well-appearing in no distress with stable vital signs.  Did note some tenderness on exam to her epigastric and left side of abdomen.  EKG without acute ischemic change, troponin x2 within normal limits, doubt ACS.  She pointed more towards her abdomen when trying to localize pain and did have some tenderness on exam.  Her labs were all grossly within normal limits and CT scan was negative for acute pathology.  On reassessment, symptoms had resolved after GI cocktail.  Given reassuring work-up and current appearance, believe patient can be discharged and managed in the outpatient setting.  Recommend she follow-up with her primary care doctor as well as cardiology as previously scheduled.    After the discussed management above, the patient was determined to be safe for discharge.  The patient was in agreement with this plan and all questions regarding their care were answered.  ED return precautions were discussed and the patient will return to the ED with any significant worsening of condition.  Final Clinical Impression(s) / ED Diagnoses Final diagnoses:  Generalized abdominal pain  Chest pain, unspecified type    Rx / DC Orders ED Discharge Orders     None        Lucrezia Starch, MD 12/30/20 [phone]

## 2021-01-01 ENCOUNTER — Encounter: Payer: Self-pay | Admitting: Cardiology

## 2021-01-01 ENCOUNTER — Other Ambulatory Visit: Payer: Self-pay

## 2021-01-01 ENCOUNTER — Ambulatory Visit: Payer: BLUE CROSS/BLUE SHIELD | Admitting: Cardiology

## 2021-01-01 VITALS — BP 130/74 | HR 100 | Temp 98.2°F | Ht 64.0 in | Wt 144.0 lb

## 2021-01-01 DIAGNOSIS — R072 Precordial pain: Secondary | ICD-10-CM

## 2021-01-01 DIAGNOSIS — R000 Tachycardia, unspecified: Secondary | ICD-10-CM

## 2021-01-01 DIAGNOSIS — I10 Essential (primary) hypertension: Secondary | ICD-10-CM

## 2021-01-01 DIAGNOSIS — R0602 Shortness of breath: Secondary | ICD-10-CM

## 2021-01-01 DIAGNOSIS — R079 Chest pain, unspecified: Secondary | ICD-10-CM

## 2021-01-01 DIAGNOSIS — R0989 Other specified symptoms and signs involving the circulatory and respiratory systems: Secondary | ICD-10-CM

## 2021-01-01 MED ORDER — METOPROLOL TARTRATE 50 MG PO TABS: 50.0000 mg | ORAL_TABLET | Freq: Two times a day (BID) | ORAL | 0 refills | Status: DC

## 2021-01-01 NOTE — Progress Notes (Signed)
Date:  01/01/2021   ID:  Dwyane Dee, DOB 06/28/1971, MRN [id number]  PCP:  Vivi Barrack, MD  Cardiologist:  Rex Kras, DO, [facility] (established care 01/01/2021)  REASON FOR CONSULT: Rapid heart rate.  REQUESTING PHYSICIAN:  Vivi Barrack, MD [address] [place],  [place] [address]  Chief Complaint  Patient presents with   Tachycardia   New Patient (Initial Visit)    HPI  Selena Holt is a 50 y.o. female who presents to the office with a chief complaint of " fast heart rate.." Patient's past medical history and cardiovascular risk factors include: NIDDM, Hypertension, Hx of kidney stones, aortic atherosclerosis.  She is referred to the office at the request of Vivi Barrack, MD for evaluation of rapid heart rate.  Patient states that on December 23, 2020 she was getting ready for colonoscopy and while consuming the prep prior to the procedure she started having projectile vomiting and just felt sick.  Since then she started noticing her heart rate being labile with a range between 40-120 bpm.  She later went to her PCP for evaluation and management and was informed that she has an abnormal EKG and to follow-up with cardiology.  And if her symptoms were to worsen in the interim to seek medical attention by going to the ER.  Over the weekend patient did go to the ER due to palpitations and chest pain.  He described the chest pain as a tightness/pressure-like sensation located substernally, present for the last 1 week, intermittent, lasting for few hours at length, at times worse with effort related activities, and usually self-limited.  No improving or worsening factors.  In the ER patient is high sensitive troponins were negative x2.  EKG did not suggest ACS.  The patient was discharged home with close follow-up with cardiology.  Patient denies any active chest pain at the time of the evaluation.  Family history of premature CAD: Dad has PCIs at the age of 45.   FUNCTIONAL  STATUS: No structured exercise program or daily routine.    ALLERGIES: Allergies  Allergen Reactions   Tetanus Toxoids Anaphylaxis    tetanus   Ceclor [Cefaclor] Hives   Doxycycline Other (See Comments)    Hives   Penicillins Hives   Sulfa Antibiotics Hives   Lisinopril Other (See Comments)    fatigue    MEDICATION LIST PRIOR TO VISIT: Current Meds  Medication Sig   blood glucose meter kit and supplies KIT Dispense based on patient and insurance preference. Use up to four times daily as directed. (FOR ICD-9 250.00, 250.01).   Cholecalciferol (D3 MAXIMUM STRENGTH) 5000 UNIT/ML LIQD Take 1 mL (5,000 Units total) by mouth daily with supper. (Patient taking differently: Take 5,000 Units by mouth. Pt only taking one to two times a week)   Cyanocobalamin (VITAMIN B-12 PO) Take 1,200 mcg by mouth. Pt only taking twice a week   ibuprofen (ADVIL) 200 MG tablet Take 200 mg by mouth every 6 (six) hours as needed.   levonorgestrel (MIRENA) 20 MCG/24HR IUD 1 each by Intrauterine route once.   Magnesium 250 MG TABS Take by mouth.   metFORMIN (GLUCOPHAGE) 500 MG tablet Take 1 tablet (500 mg total) by mouth 2 (two) times daily with a meal.   metoprolol tartrate (LOPRESSOR) 50 MG tablet Take 1 tablet (50 mg total) by mouth 2 (two) times daily.   OneTouch Delica Lancets 36I MISC Use daily as needed to check blood sugar.   vitamin  C (ASCORBIC ACID) 500 MG tablet Take 500 mg by mouth daily.   [DISCONTINUED] fluticasone (FLONASE) 50 MCG/ACT nasal spray Place into both nostrils daily.   [DISCONTINUED] pantoprazole (PROTONIX) 40 MG tablet Take 1 tablet (40 mg total) by mouth daily.     PAST MEDICAL HISTORY: Past Medical History:  Diagnosis Date   Allergy    Anemia    Anxiety    Bipolar disorder ([place])    Collapsed lung    "spontaneous" , teenager   Constipation    Depression    Diabetes mellitus without complication (HCC)    Glaucoma    Heart murmur    HSV-1 (herpes simplex virus 1)  infection    Hypertension    Kidney stones    Meningitis    Scoliosis (and kyphoscoliosis), idiopathic     PAST SURGICAL HISTORY: Past Surgical History:  Procedure Laterality Date   CHEST TUBE INSERTION     TYMPANOSTOMY TUBE PLACEMENT      FAMILY HISTORY: The patient family history includes Alzheimer's disease (age of onset: 61) in her maternal grandmother; CAD in her father; Heart disease in her father; Hyperlipidemia in her father and mother; Hypertension in her father and mother; Other in her brother.  SOCIAL HISTORY:  The patient  reports that she has never smoked. She has never used smokeless tobacco. She reports that she does not drink alcohol and does not use drugs.  REVIEW OF SYSTEMS: Review of Systems  Constitutional: Negative for chills and fever.  HENT:  Negative for hoarse voice and nosebleeds.   Eyes:  Negative for discharge, double vision and pain.  Cardiovascular:  Positive for chest pain. Negative for claudication, dyspnea on exertion, leg swelling, near-syncope, orthopnea, palpitations, paroxysmal nocturnal dyspnea and syncope.  Respiratory:  Positive for shortness of breath. Negative for hemoptysis.   Musculoskeletal:  Negative for muscle cramps and myalgias.  Gastrointestinal:  Negative for abdominal pain, constipation, diarrhea, hematemesis, hematochezia, melena, nausea and vomiting.  Neurological:  Negative for dizziness and light-headedness.   PHYSICAL EXAM: Vitals with BMI 01/01/2021 12/30/2020 12/30/2020  Height 5' 4"  - -  Weight 144 lbs - -  BMI 93.81 - -  Systolic 829 937 169  Diastolic 74 84 93  Pulse 678 92 114    CONSTITUTIONAL: Well-developed and well-nourished. No acute distress.  SKIN: Skin is warm and dry. No rash noted. No cyanosis. No pallor. No jaundice HEAD: Normocephalic and atraumatic.  EYES: No scleral icterus MOUTH/THROAT: Moist oral membranes.  NECK: No JVD present. No thyromegaly noted.  Right carotid bruits  LYMPHATIC: No visible  cervical adenopathy.  CHEST Normal respiratory effort. No intercostal retractions  LUNGS: Clear to auscultation bilaterally. No stridor. No wheezes. No rales.  CARDIOVASCULAR: Regular rhythm, tachycardia, positive L3-Y1, systolic murmur, no rubs or gallops appreciated. ABDOMINAL: No apparent ascites.  EXTREMITIES: No peripheral edema  HEMATOLOGIC: No significant bruising NEUROLOGIC: Oriented to person, place, and time. Nonfocal. Normal muscle tone.  PSYCHIATRIC: Normal mood and affect. Normal behavior. Cooperative  CARDIAC DATABASE: EKG: 01/01/2021: Normal sinus rhythm, 91 bpm, nonspecific T wave changes, without underlying injury pattern.  Echocardiogram: No results found for this or any previous visit from the past 1095 days.   Stress Testing: No results found for this or any previous visit from the past 1095 days.  Heart Catheterization: None  LABORATORY DATA: CBC Latest Ref Rng & Units 12/29/2020 12/28/2020 02/24/2020  WBC 4.0 - 10.5 K/uL 12.5(H) 11.3(H) 9.7  Hemoglobin 12.0 - 15.0 g/dL 13.0 12.7 12.2  Hematocrit  36.0 - 46.0 % 37.3 36.6 36.0  Platelets 150 - 400 K/uL 321 335 258    CMP Latest Ref Rng & Units 12/29/2020 12/28/2020 02/24/2020  Glucose 70 - 99 mg/dL 138(H) 111(H) 160(H)  BUN 6 - 20 mg/dL 13 16 17   Creatinine 0.44 - 1.00 mg/dL 0.67 0.84 0.64  Sodium 135 - 145 mmol/L 137 137 137  Potassium 3.5 - 5.1 mmol/L 4.0 4.0 4.1  Chloride 98 - 111 mmol/L 101 100 105  CO2 22 - 32 mmol/L 24 24 25   Calcium 8.9 - 10.3 mg/dL 10.0 10.4(H) 9.7  Total Protein 6.1 - 8.1 g/dL - 7.1 6.7  Total Bilirubin 0.2 - 1.2 mg/dL - 0.6 0.5  Alkaline Phos 39 - 117 U/L - - -  AST 10 - 35 U/L - 22 26  ALT 6 - 29 U/L - 32(H) 67(H)    Lipid Panel (labs 12/28/2020 non-fasting)    Component Value Date/Time   CHOL 192 12/28/2020 1518   TRIG 288 (H) 12/28/2020 1518   HDL 32 (L) 12/28/2020 1518   CHOLHDL 6.0 (H) 12/28/2020 1518   VLDL 27.2 04/22/2019 0831   LDLCALC 116 (H) 12/28/2020 1518    No  components found for: NTPROBNP No results for input(s): PROBNP in the last 8760 hours. Recent Labs    02/24/20 1146 12/28/20 1518  TSH 2.97 2.71    BMP Recent Labs    02/24/20 1146 12/28/20 1518 12/29/20 2137  NA 137 137 137  K 4.1 4.0 4.0  CL 105 100 101  CO2 25 24 24   GLUCOSE 160* 111* 138*  BUN 17 16 13   CREATININE 0.64 0.84 0.67  CALCIUM 9.7 10.4* 10.0  GFRNONAA  --   --  >60    HEMOGLOBIN A1C Lab Results  Component Value Date   HGBA1C 7.3 (H) 02/24/2020   MPG 163 02/24/2020    IMPRESSION:    ICD-10-CM   1. Precordial pain  R07.2 metoprolol tartrate (LOPRESSOR) 50 MG tablet    CT CORONARY MORPH W/CTA COR W/SCORE W/CA W/CM &/OR WO/CM    Basic metabolic panel    PCV ECHOCARDIOGRAM COMPLETE    2. Shortness of breath  R06.02 CT CORONARY MORPH W/CTA COR W/SCORE W/CA W/CM &/OR WO/CM    3. Tachycardia  R00.0 EKG 12-Lead    metoprolol tartrate (LOPRESSOR) 50 MG tablet    4. Essential hypertension  I10     5. Chest pain, unspecified type  R07.9 CT CORONARY MORPH W/CTA COR W/SCORE W/CA W/CM &/OR WO/CM    6. Right carotid bruit  R09.89 PCV CAROTID DUPLEX (BILATERAL)       RECOMMENDATIONS: Selena Holt is a 50 y.o. female whose past medical history and cardiac risk factors include: NIDDM, Hypertension, Hx of kidney stones, aortic atherosclerosis.  Precordial pain: Patient symptoms of chest pain may be possibly cardiac cardiac in etiology Given her multiple cardiovascular risk factors including diabetes mellitus for the last 8 years recommend an ischemic evaluation. Echocardiogram will be ordered to evaluate for structural heart disease and left ventricular systolic function. Coronary CTA plus or minus FFR to evaluate for obstructive CAD. Will start Lopressor 50 mg p.o. twice daily. Most recent lipid profile from 12/28/2020 reviewed-patient states that she was not fasting.  But given the numbers I suspect that her lipid profile is not well controlled.  Patient  states that she will follow-up with PCP for repeat lipids. In addition, given her diabetes recommended statin therapy as per guideline recommendations.  She would  like to discuss this further with PCP.  Shortness of breath: See above  Right carotid bruit: Check carotid duplex to evaluate for carotid artery stenosis/atherosclerosis.  FINAL MEDICATION LIST END OF ENCOUNTER: Meds ordered this encounter  Medications   metoprolol tartrate (LOPRESSOR) 50 MG tablet    Sig: Take 1 tablet (50 mg total) by mouth 2 (two) times daily.    Dispense:  180 tablet    Refill:  0     Medications Discontinued During This Encounter  Medication Reason   fluticasone (FLONASE) 50 MCG/ACT nasal spray Error   mometasone (NASONEX) 50 MCG/ACT nasal spray Error   pantoprazole (PROTONIX) 40 MG tablet Completed Course     Current Outpatient Medications:    blood glucose meter kit and supplies KIT, Dispense based on patient and insurance preference. Use up to four times daily as directed. (FOR ICD-9 250.00, 250.01)., Disp: 1 each, Rfl: 0   Cholecalciferol (D3 MAXIMUM STRENGTH) 5000 UNIT/ML LIQD, Take 1 mL (5,000 Units total) by mouth daily with supper. (Patient taking differently: Take 5,000 Units by mouth. Pt only taking one to two times a week), Disp: 30 mL, Rfl: 2   Cyanocobalamin (VITAMIN B-12 PO), Take 1,200 mcg by mouth. Pt only taking twice a week, Disp: , Rfl:    ibuprofen (ADVIL) 200 MG tablet, Take 200 mg by mouth every 6 (six) hours as needed., Disp: , Rfl:    levonorgestrel (MIRENA) 20 MCG/24HR IUD, 1 each by Intrauterine route once., Disp: , Rfl:    Magnesium 250 MG TABS, Take by mouth., Disp: , Rfl:    metFORMIN (GLUCOPHAGE) 500 MG tablet, Take 1 tablet (500 mg total) by mouth 2 (two) times daily with a meal., Disp: 180 tablet, Rfl: 3   metoprolol tartrate (LOPRESSOR) 50 MG tablet, Take 1 tablet (50 mg total) by mouth 2 (two) times daily., Disp: 180 tablet, Rfl: 0   OneTouch Delica Lancets 03J MISC,  Use daily as needed to check blood sugar., Disp: 100 each, Rfl: 2   vitamin C (ASCORBIC ACID) 500 MG tablet, Take 500 mg by mouth daily., Disp: , Rfl:    glucose blood (ONETOUCH VERIO) test strip, Use as instructed to check blood sugar up to 3 times daily, Disp: 300 each, Rfl: 0   sucralfate (CARAFATE) 1 g tablet, Take 1 tablet (1 g total) by mouth 4 (four) times daily -  with meals and at bedtime. (Patient not taking: Reported on 01/01/2021), Disp: 40 tablet, Rfl: 0  Orders Placed This Encounter  Procedures   CT CORONARY MORPH W/CTA COR W/SCORE W/CA W/CM &/OR WO/CM   Basic metabolic panel   EKG [id number]   PCV ECHOCARDIOGRAM COMPLETE   PCV CAROTID DUPLEX (BILATERAL)    There are no Patient Instructions on file for this visit.   --Continue cardiac medications as reconciled in final medication list. --Return in about 4 weeks (around 01/29/2021) for Reevaluation of, Chest pain, Review test results. Or sooner if needed. --Continue follow-up with your primary care physician regarding the management of your other chronic comorbid conditions.  Patient's questions and concerns were addressed to her satisfaction. She voices understanding of the instructions provided during this encounter.   This note was created using a voice recognition software as a result there may be grammatical errors inadvertently enclosed that do not reflect the nature of this encounter. Every attempt is made to correct such errors.  Rex Kras, [place], [facility]  Pager: [phone] Office: [phone]

## 2021-01-01 NOTE — Telephone Encounter (Signed)
Is there a record release available to discuss this patient's info?  Thanks,  Luan Pulling

## 2021-01-03 ENCOUNTER — Other Ambulatory Visit: Payer: Self-pay

## 2021-01-03 DIAGNOSIS — R072 Precordial pain: Secondary | ICD-10-CM

## 2021-01-03 DIAGNOSIS — R0602 Shortness of breath: Secondary | ICD-10-CM

## 2021-01-03 DIAGNOSIS — R079 Chest pain, unspecified: Secondary | ICD-10-CM

## 2021-01-03 DIAGNOSIS — I6523 Occlusion and stenosis of bilateral carotid arteries: Secondary | ICD-10-CM

## 2021-01-03 DIAGNOSIS — R0989 Other specified symptoms and signs involving the circulatory and respiratory systems: Secondary | ICD-10-CM

## 2021-01-09 ENCOUNTER — Ambulatory Visit: Payer: BLUE CROSS/BLUE SHIELD

## 2021-01-09 ENCOUNTER — Other Ambulatory Visit: Payer: Self-pay

## 2021-01-15 ENCOUNTER — Encounter: Payer: Self-pay | Admitting: Family Medicine

## 2021-01-18 ENCOUNTER — Other Ambulatory Visit: Payer: BLUE CROSS/BLUE SHIELD

## 2021-01-22 NOTE — Telephone Encounter (Signed)
I would defer this to her PCP at this time.   From cardiovascular standpoint, only the echo has been done which notes normal pumping activity (I.e. normal LVEF).   I have no data to justify her FMLA request and therefore deferring it to PCP at this time.   She has CCTA pending and once I know the results may reconsider it.   Thank-you,   Dr. Odis Hollingshead

## 2021-01-22 NOTE — Telephone Encounter (Signed)
From patient.

## 2021-01-24 ENCOUNTER — Encounter: Payer: Self-pay | Admitting: Family Medicine

## 2021-01-24 ENCOUNTER — Telehealth: Payer: BLUE CROSS/BLUE SHIELD | Admitting: Family Medicine

## 2021-01-24 ENCOUNTER — Telehealth (INDEPENDENT_AMBULATORY_CARE_PROVIDER_SITE_OTHER): Payer: BLUE CROSS/BLUE SHIELD | Admitting: Family Medicine

## 2021-01-24 VITALS — Ht 64.0 in | Wt 141.9 lb

## 2021-01-24 DIAGNOSIS — E119 Type 2 diabetes mellitus without complications: Secondary | ICD-10-CM

## 2021-01-24 DIAGNOSIS — I10 Essential (primary) hypertension: Secondary | ICD-10-CM | POA: Diagnosis not present

## 2021-01-24 DIAGNOSIS — F439 Reaction to severe stress, unspecified: Secondary | ICD-10-CM

## 2021-01-24 NOTE — Assessment & Plan Note (Signed)
A lot of job stress.  We will complete FMLA paperwork.  She is also currently undergoing cardiac evaluation which is a source of stress as well.

## 2021-01-24 NOTE — Assessment & Plan Note (Signed)
Needs A1c checked soon.  She is on metformin 500 mg twice daily.  She will be coming in next month for CPE and we will recheck at that time.

## 2021-01-24 NOTE — Assessment & Plan Note (Signed)
On metoprolol 50 mg twice daily and tolerating well.

## 2021-01-24 NOTE — Progress Notes (Signed)
   Selena Holt is a 50 y.o. female who presents today for a virtual office visit.  Assessment/Plan:  Chronic Problems Addressed Today: T2DM (type 2 diabetes mellitus) (HCC) Needs A1c checked soon.  She is on metformin 500 mg twice daily.  She will be coming in next month for CPE and we will recheck at that time.  Stress A lot of job stress.  We will complete FMLA paperwork.  She is also currently undergoing cardiac evaluation which is a source of stress as well.    Essential hypertension On metoprolol 50 mg twice daily and tolerating well.     Subjective:  HPI:  See A/P for status of chronic conditions she is currently undergoing cardiac work-up for rapid heartbeat.  Recently had echocardiogram was reassuring.  Had grade 1 diastolic dysfunction with normal EF.  She has upcoming CT cardiac scan pending.  She will also need fasting labs soon.  She has been under quite a bit of stress at work.  She has been working close to 60 hours per weekly since going to mandatory overtime couple of months ago on April.  She feels like this is good too much.  He has made it difficult for her to take care of her self or exercise due to the exhaustion.       Objective/Observations  Physical Exam: Gen: NAD, resting comfortably Pulm: Normal work of breathing Neuro: Grossly normal, moves all extremities Psych: Normal affect and thought content  Virtual Visit via Video   I connected with [name] on 01/24/21 at  3:40 PM EDT by a video enabled telemedicine application and verified that I am speaking with the correct person using two identifiers. The limitations of evaluation and management by telemedicine and the availability of in person appointments were discussed. The patient expressed understanding and agreed to proceed.   Patient location: Home Provider location: [place] [employer] Persons participating in the virtual visit: Myself and Patient     Katina Degree. [name],  MD 01/24/2021 3:58 PM

## 2021-01-28 ENCOUNTER — Telehealth: Payer: Self-pay | Admitting: *Deleted

## 2021-01-28 NOTE — Telephone Encounter (Signed)
[employer] FMLA form ready to be pick up at our front office   Form placed at front office

## 2021-01-29 ENCOUNTER — Ambulatory Visit: Payer: BLUE CROSS/BLUE SHIELD | Admitting: Cardiology

## 2021-02-13 ENCOUNTER — Encounter: Payer: Self-pay | Admitting: Family Medicine

## 2021-02-13 NOTE — Telephone Encounter (Signed)
Form printed

## 2021-02-13 NOTE — Telephone Encounter (Signed)
Patients employer is requesting for the recent FMLA forms (can be printed from [employer]) to be updated with below changes.  Is requesting changes make or added to be initialed by provider.  Please call Mettie once forms are completed or if you have any questions at [phone].

## 2021-02-15 ENCOUNTER — Encounter: Payer: Self-pay | Admitting: Gastroenterology

## 2021-02-18 NOTE — Telephone Encounter (Signed)
FMLA Form updated  Patient notified  Form placed at front office  Copy send to be scan

## 2021-02-21 ENCOUNTER — Ambulatory Visit: Payer: BLUE CROSS/BLUE SHIELD

## 2021-02-21 ENCOUNTER — Other Ambulatory Visit: Payer: BLUE CROSS/BLUE SHIELD

## 2021-02-21 ENCOUNTER — Other Ambulatory Visit: Payer: Self-pay

## 2021-02-22 LAB — BASIC METABOLIC PANEL
BUN/Creatinine Ratio: 23 (ref 9–23)
BUN: 14 mg/dL (ref 6–24)
CO2: 17 mmol/L — ABNORMAL LOW (ref 20–29)
Calcium: 10 mg/dL (ref 8.7–10.2)
Chloride: 100 mmol/L (ref 96–106)
Creatinine, Ser: 0.62 mg/dL (ref 0.57–1.00)
Glucose: 143 mg/dL — ABNORMAL HIGH (ref 65–99)
Potassium: 4.4 mmol/L (ref 3.5–5.2)
Sodium: 139 mmol/L (ref 134–144)
eGFR: 110 mL/min/{1.73_m2} (ref >59)

## 2021-02-25 ENCOUNTER — Encounter: Payer: Self-pay | Admitting: Family Medicine

## 2021-02-27 ENCOUNTER — Other Ambulatory Visit: Payer: Self-pay | Admitting: Cardiology

## 2021-02-27 DIAGNOSIS — I6523 Occlusion and stenosis of bilateral carotid arteries: Secondary | ICD-10-CM

## 2021-02-27 NOTE — Telephone Encounter (Signed)
Patient calling to f/u on FMLA patient states she about to loss her job.

## 2021-02-28 NOTE — Telephone Encounter (Signed)
Spoke with patient stated FMLA was denied due to lock of information  Patient want to have hours reduce to 40hr from 60hr daily  due to mental health, stress and exhaustion Dx and symptoms need to be written in FMLA form

## 2021-02-28 NOTE — Telephone Encounter (Signed)
See below

## 2021-03-01 NOTE — Progress Notes (Signed)
Patient called she is aware

## 2021-03-01 NOTE — Progress Notes (Signed)
No answer left a vm

## 2021-03-02 ENCOUNTER — Encounter: Payer: Self-pay | Admitting: Family Medicine

## 2021-03-04 NOTE — Telephone Encounter (Signed)
FMLA form was placed in PCP desk to be reviewed

## 2021-03-04 NOTE — Telephone Encounter (Signed)
See below

## 2021-03-04 NOTE — Telephone Encounter (Signed)
FMLA form place at front office  LVM to patient form ready to be pick up

## 2021-03-06 ENCOUNTER — Other Ambulatory Visit: Payer: Self-pay | Admitting: *Deleted

## 2021-03-08 ENCOUNTER — Other Ambulatory Visit: Payer: Self-pay

## 2021-03-08 ENCOUNTER — Ambulatory Visit (INDEPENDENT_AMBULATORY_CARE_PROVIDER_SITE_OTHER): Payer: BLUE CROSS/BLUE SHIELD | Admitting: Family Medicine

## 2021-03-08 ENCOUNTER — Encounter: Payer: Self-pay | Admitting: Family Medicine

## 2021-03-08 VITALS — BP 153/86 | HR 111 | Temp 98.3°F | Ht 64.0 in | Wt 137.4 lb

## 2021-03-08 DIAGNOSIS — E119 Type 2 diabetes mellitus without complications: Secondary | ICD-10-CM

## 2021-03-08 DIAGNOSIS — E1169 Type 2 diabetes mellitus with other specified complication: Secondary | ICD-10-CM

## 2021-03-08 DIAGNOSIS — E538 Deficiency of other specified B group vitamins: Secondary | ICD-10-CM

## 2021-03-08 DIAGNOSIS — E559 Vitamin D deficiency, unspecified: Secondary | ICD-10-CM

## 2021-03-08 DIAGNOSIS — D509 Iron deficiency anemia, unspecified: Secondary | ICD-10-CM

## 2021-03-08 DIAGNOSIS — Z0001 Encounter for general adult medical examination with abnormal findings: Secondary | ICD-10-CM | POA: Diagnosis not present

## 2021-03-08 DIAGNOSIS — E785 Hyperlipidemia, unspecified: Secondary | ICD-10-CM

## 2021-03-08 DIAGNOSIS — J029 Acute pharyngitis, unspecified: Secondary | ICD-10-CM

## 2021-03-08 DIAGNOSIS — R768 Other specified abnormal immunological findings in serum: Secondary | ICD-10-CM

## 2021-03-08 DIAGNOSIS — I152 Hypertension secondary to endocrine disorders: Secondary | ICD-10-CM

## 2021-03-08 DIAGNOSIS — E1159 Type 2 diabetes mellitus with other circulatory complications: Secondary | ICD-10-CM

## 2021-03-08 DIAGNOSIS — F439 Reaction to severe stress, unspecified: Secondary | ICD-10-CM

## 2021-03-08 MED ORDER — AZITHROMYCIN 250 MG PO TABS: ORAL_TABLET | ORAL | 0 refills | Status: AC

## 2021-03-08 MED ORDER — AZELASTINE HCL 0.1 % NA SOLN: 2.0000 | Freq: Two times a day (BID) | NASAL | 12 refills | Status: DC

## 2021-03-08 NOTE — Assessment & Plan Note (Signed)
Check A1c.  She is on metformin 500 mg twice daily.

## 2021-03-08 NOTE — Assessment & Plan Note (Signed)
Check labs 

## 2021-03-08 NOTE — Assessment & Plan Note (Signed)
Slightly above goal.  Typically well controlled.  Continue metoprolol tartrate 50 mg twice daily.  Continue home monitoring.  We will check labs today.  Follow-up in 3 to 6 months at next office visit.

## 2021-03-08 NOTE — Assessment & Plan Note (Signed)
Discussed management strategies.  We are working on getting her to be able to have reduced out of work.  FMLA paperwork was completed however she has not yet turned in.

## 2021-03-08 NOTE — Progress Notes (Signed)
Chief Complaint:  Selena Holt is a 50 y.o. female who presents today for her annual comprehensive physical exam.    Assessment/Plan:  New/Acute Problems: Sore throat No red flag signs or symptoms.  Home COVID test negative.  May have allergic rhinitis or mild viral infection.  Will start Astelin nasal spray.  Encourage good oral hydration.  We will send in a proper prescription for azithromycin with instruction to not start unless symptoms worsen or fail to improve over the next several days.  Positive ANA Recheck today.  Incidentally found by her functional medicine doctor a few months ago.  Myalgias No red flags.  Normal musculoskeletal and neurologic exam today.  We will check labs.  Chronic Problems Addressed Today: Anemia, iron deficiency Check iron panel.  Vitamin B 12 deficiency Check B12.  Vitamin D deficiency Check vitamin D.  Dyslipidemia associated with type 2 diabetes mellitus ([place]) Check labs.  T2DM (type 2 diabetes mellitus) ([facility]) Check A1c.  She is on metformin 500 mg twice daily.  Hypertension associated with diabetes ([place]) Slightly above goal.  Typically well controlled.  Continue metoprolol tartrate 50 mg twice daily.  Continue home monitoring.  We will check labs today.  Follow-up in 3 to 6 months at next office visit.  Stress Discussed management strategies.  We are working on getting her to be able to have reduced out of work.  FMLA paperwork was completed however she has not yet turned in.   Preventative Healthcare: Check Labs.  Due for colonoscopy.  She had a bad reaction to GoLytely in the past.  We will splitters again at some point in the future.  Will be following up with OB/GYN for Pap smear soon.  Up-to-date on vaccines.  Patient Counseling(The following topics were reviewed and/or handout was given):  -Nutrition: Stressed importance of moderation in sodium/caffeine intake, saturated fat and cholesterol, caloric balance, sufficient intake  of fresh fruits, vegetables, and fiber.  -Stressed the importance of regular exercise.   -Substance Abuse: Discussed cessation/primary prevention of tobacco, alcohol, or other drug use; driving or other dangerous activities under the influence; availability of treatment for abuse.   -Injury prevention: Discussed safety belts, safety helmets, smoke detector, smoking near bedding or upholstery.   -Sexuality: Discussed sexually transmitted diseases, partner selection, use of condoms, avoidance of unintended pregnancy and contraceptive alternatives.   -Dental health: Discussed importance of regular tooth brushing, flossing, and dental visits.  -Health maintenance and immunizations reviewed. Please refer to Health maintenance section.  Return to care in 1 year for next preventative visit.     Subjective:  HPI:  She has a few issues that she would like to discuss today:   03/06/2021 She developed a sore throat which she has tried a couple of home treatments for such as gargling salt water. She took a test to see if it was covid and it tested negative. She has some mucus buildup in the base of her throat, as well as aches, chills and hot flashes. She has not checked her temperature during this period of time. Medication she has taken includes zyrtec, flonase, ibuprofen.  Additionally, she has significant aches in her arms which has been persistent for months. She describes it as "the worst toothache ever, but in my arm". Moreover, it feels like someone had beat her across her back as well. This occurs after waking up in the morning, after which she has to rotate and stretch to alleviate stiffness that is present during the morning.  Her blood sugar has been decreasing after having started a new diet, as well as cutting out gluten after being diagnosed with a gluten sensitivity. This has also helped her lose a notable amount of weight. She has also started taking magnesium supplements which has improved  her sleep significantly.  She states that she has lost muscle in her left leg, as it is not symmetrical with her right leg. However, leg strength in both are normal. There is stiffness present in the left leg, and denies it tingling recently.  Lifestyle Diet: Reasonably healthy diet.  Exercise: She has been trying ot exercise, but her 60 hour work week gets in the way.  Depression screen PHQ 2/9 03/08/2021  Decreased Interest 0  Down, Depressed, Hopeless 0  PHQ - 2 Score 0  Some recent data might be hidden    Health Maintenance Due  Topic Date Due   OPHTHALMOLOGY EXAM  02/28/2018   URINE MICROALBUMIN  08/26/2018   PAP SMEAR-Modifier  11/10/2019   HEMOGLOBIN A1C  08/26/2020   INFLUENZA VACCINE  02/18/2021    ROS: Per HPI, otherwise a complete review of systems was negative.   PMH:  The following were reviewed and entered/updated in epic: Past Medical History:  Diagnosis Date   Allergy    Anemia    Anxiety    Bipolar disorder ([place])    Collapsed lung    "spontaneous" , teenager   Constipation    Depression    Diabetes mellitus without complication (H[facility])    Glaucoma    Heart murmur    HSV-1 (herpes simplex virus 1) infection    Hypertension    Kidney stones    Meningitis    Scoliosis (and kyphoscoliosis), idiopathic    Patient Active Problem List   Diagnosis Date Noted   Hypertension associated with diabetes ([place]) 03/01/2020   Back pain 05/13/2019   Stress 01/14/2018   Fatty liver 01/08/2018   Anemia, iron deficiency 01/03/2015   Vitamin B 12 deficiency 03/09/2014   Dyslipidemia associated with type 2 diabetes mellitus ([place]) 02/10/2014   Vitamin D deficiency 02/10/2014   T2DM (type 2 diabetes mellitus) ([place]) 11/11/2012   Past Surgical History:  Procedure Laterality Date   CHEST TUBE INSERTION     TYMPANOSTOMY TUBE PLACEMENT      Family History  Problem Relation Age of Onset   Hyperlipidemia Mother    Hypertension Mother    Hyperlipidemia Father     Hypertension Father    CAD Father    Heart disease Father    Other Brother        colostomy    Alzheimer's disease Maternal Grandmother 1   Colon cancer Neg Hx    Colon polyps Neg Hx    Esophageal cancer Neg Hx    Rectal cancer Neg Hx    Stomach cancer Neg Hx     Medications- reviewed and updated Current Outpatient Medications  Medication Sig Dispense Refill   azelastine (ASTELIN) 0.1 % nasal spray Place 2 sprays into both nostrils 2 (two) times daily. 30 mL 12   azithromycin (ZITHROMAX) 250 MG tablet Take 2 tablets on day 1, then 1 tablet daily on days 2 through 5 6 tablet 0   blood glucose meter kit and supplies KIT Dispense based on patient and insurance preference. Use up to four times daily as directed. (FOR ICD-9 250.00, 250.01). 1 each 0   Cholecalciferol (D3 MAXIMUM STRENGTH) 5000 UNIT/ML LIQD Take 1 mL (5,000 Units total) by mouth daily  with supper. (Patient taking differently: Take 5,000 Units by mouth. Pt only taking one to two times a week) 30 mL 2   Cyanocobalamin (VITAMIN B-12 PO) Take 1,200 mcg by mouth. Pt only taking twice a week     glucose blood (ONETOUCH VERIO) test strip Use as instructed to check blood sugar up to 3 times daily 300 each 0   ibuprofen (ADVIL) 200 MG tablet Take 200 mg by mouth every 6 (six) hours as needed.     Magnesium 250 MG TABS Take by mouth.     metFORMIN (GLUCOPHAGE) 500 MG tablet Take 1 tablet (500 mg total) by mouth 2 (two) times daily with a meal. 180 tablet 3   metoprolol tartrate (LOPRESSOR) 50 MG tablet Take 1 tablet (50 mg total) by mouth 2 (two) times daily. 180 tablet 0   OneTouch Delica Lancets 76E MISC Use daily as needed to check blood sugar. 100 each 2   sucralfate (CARAFATE) 1 g tablet Take 1 tablet (1 g total) by mouth 4 (four) times daily -  with meals and at bedtime. 40 tablet 0   vitamin C (ASCORBIC ACID) 500 MG tablet Take 500 mg by mouth daily.     No current facility-administered medications for this visit.     Allergies-reviewed and updated Allergies  Allergen Reactions   Tetanus Toxoids Anaphylaxis    tetanus   Ceclor [Cefaclor] Hives   Doxycycline Other (See Comments)    Hives   Penicillins Hives   Sulfa Antibiotics Hives   Lisinopril Other (See Comments)    fatigue    Social History   Socioeconomic History   Marital status: Single    Spouse name: Not on file   Number of children: 2   Years of education: Not on file   Highest education level: Not on file  Occupational History   Occupation: clerical    Employer: [employer]    Comment: [employer]  Tobacco Use   Smoking status: Never   Smokeless tobacco: Never  Vaping Use   Vaping Use: Never used  Substance and Sexual Activity   Alcohol use: No    Alcohol/week: 0.0 standard drinks   Drug use: No   Sexual activity: Never  Other Topics Concern   Not on file  Social History Narrative   She lives with her grown son. 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   Social Determinants of Health   Financial Resource Strain: Not on file  Food Insecurity: Not on file  Transportation Needs: Not on file  Physical Activity: Not on file  Stress: Not on file  Social Connections: Not on file        Objective:  Physical Exam: BP (!) 153/86   Pulse (!) 111   Temp 98.3 F (36.8 C) (Temporal)   Ht _0  (1.626 m)   Wt 137 lb 6.4 oz (62.3 kg)   SpO2 100%   BMI 23.58 kg/m   Body mass index is 23.58 kg/m. Wt Readings from Last 3 Encounters:  03/08/21 137 lb 6.4 oz (62.3 kg)  01/24/21 141 lb 14.4 oz (64.4 kg)  01/01/21 144 lb (65.3 kg)   Gen: NAD, resting comfortably HEENT: TMs normal bilaterally. OP clear. No thyromegaly noted. Slight Erythema in throat noted. No LAD CV: Heartrate is slightly faster than normal, Regular Rhythm with no murmurs appreciated Pulm: NWOB, CTAB with no crackles, wheezes, or rhonchi GI: Normal bowel sounds present. Soft, Nontender, Nondistended. MSK: no edema, cyanosis, or clubbing  noted  Skin: warm, dry Neuro: CN2-12 grossly intact. Strength 5/5 in upper and lower extremities. Reflexes symmetric and intact bilaterally.  Psych: Normal affect and thought content     I,Jordan Kelly,acting as a scribe for Dimas Chyle, MD.,have documented all relevant documentation on the behalf of Dimas Chyle, MD,as directed by  Dimas Chyle, MD while in the presence of Dimas Chyle, MD.  I, Dimas Chyle, MD, have reviewed all documentation for this visit. The documentation on 03/08/21 for the exam, diagnosis, procedures, and orders are all accurate and complete.  Algis Greenhouse. Jerline Pain, MD 03/08/2021 3:03 PM

## 2021-03-08 NOTE — Assessment & Plan Note (Signed)
Check iron panel. 

## 2021-03-08 NOTE — Patient Instructions (Signed)
It was very nice to see you today!  Please start the [place].  If not improving over the next few days if her symptoms worsen please start the Z-Pak.  We will check blood work today.  I will probably see you back in 3 to 6 months depending on the results of your blood work.  Please let us know if you need to be seen sooner.  Take care, Dr Jerline Pain  PLEASE NOTE:  If you had any lab tests please let us know if you have not heard back within a few days. You may see your results on mychart before we have a chance to review them but we will give you a call once they are reviewed by [place]. If we ordered any referrals today, please let us know if you have not heard from their office within the next week.   Please try these tips to maintain a healthy lifestyle:  Eat at least 3 REAL meals and 1-2 snacks per day.  Aim for no more than 5 hours between eating.  If you eat breakfast, please do so within one hour of getting up.   Each meal should contain half fruits/vegetables, one quarter protein, and one quarter carbs (no bigger than a computer mouse)  Cut down on sweet beverages. This includes juice, soda, and sweet tea.   Drink at least 1 glass of water with each meal and aim for at least 8 glasses per day  Exercise at least 150 minutes every week.    Preventive Care 23-50 Years Old, Female Preventive care refers to lifestyle choices and visits with your health care provider that can promote health and wellness. This includes: A yearly physical exam. This is also called an annual wellness visit. Regular dental and eye exams. Immunizations. Screening for certain conditions. Healthy lifestyle choices, such as: Eating a healthy diet. Getting regular exercise. Not using drugs or products that contain nicotine and tobacco. Limiting alcohol use. What can I expect for my preventive care visit? Physical exam Your health care provider will check your: Height and weight. These may be used to  calculate your BMI (body mass index). BMI is a measurement that tells if you are at a healthy weight. Heart rate and blood pressure. Body temperature. Skin for abnormal spots. Counseling Your health care provider may ask you questions about your: Past medical problems. Family's medical history. Alcohol, tobacco, and drug use. Emotional well-being. Home life and relationship well-being. Sexual activity. Diet, exercise, and sleep habits. Work and work Statistician. Access to firearms. Method of birth control. Menstrual cycle. Pregnancy history. What immunizations do I need?  Vaccines are usually given at various ages, according to a schedule. Your health care provider will recommend vaccines for you based on your age, medicalhistory, and lifestyle or other factors, such as travel or where you work. What tests do I need? Blood tests Lipid and cholesterol levels. These may be checked every 5 years, or more often if you are over 29 years old. Hepatitis C test. Hepatitis B test. Screening Lung cancer screening. You may have this screening every year starting at age 76 if you have a 30-pack-year history of smoking and currently smoke or have quit within the past 15 years. Colorectal cancer screening. All adults should have this screening starting at age 25 and continuing until age 80. Your health care provider may recommend screening at age 60 if you are at increased risk. You will have tests every 1-10 years, depending on your results and  the type of screening test. Diabetes screening. This is done by checking your blood sugar (glucose) after you have not eaten for a while (fasting). You may have this done every 1-3 years. Mammogram. This may be done every 1-2 years. Talk with your health care provider about when you should start having regular mammograms. This may depend on whether you have a family history of breast cancer. BRCA-related cancer screening. This may be done if you have  a family history of breast, ovarian, tubal, or peritoneal cancers. Pelvic exam and Pap test. This may be done every 3 years starting at age 59. Starting at age 6, this may be done every 5 years if you have a Pap test in combination with an HPV test. Other tests STD (sexually transmitted disease) testing, if you are at risk. Bone density scan. This is done to screen for osteoporosis. You may have this scan if you are at high risk for osteoporosis. Talk with your health care provider about your test results, treatment options,and if necessary, the need for more tests. Follow these instructions at home: Eating and drinking  Eat a diet that includes fresh fruits and vegetables, whole grains, lean protein, and low-fat dairy products. Take vitamin and mineral supplements as recommended by your health care provider. Do not drink alcohol if: Your health care provider tells you not to drink. You are pregnant, may be pregnant, or are planning to become pregnant. If you drink alcohol: Limit how much you have to 0-1 drink a day. Be aware of how much alcohol is in your drink. In the [place] one drink equals one 12 oz bottle of beer (355 mL), one 5 oz glass of wine (148 mL), or one 1 oz glass of hard liquor (44 mL).  Lifestyle Take daily care of your teeth and gums. Brush your teeth every morning and night with fluoride toothpaste. Floss one time each day. Stay active. Exercise for at least 30 minutes 5 or more days each week. Do not use any products that contain nicotine or tobacco, such as cigarettes, e-cigarettes, and chewing tobacco. If you need help quitting, ask your health care provider. Do not use drugs. If you are sexually active, practice safe sex. Use a condom or other form of protection to prevent STIs (sexually transmitted infections). If you do not wish to become pregnant, use a form of birth control. If you plan to become pregnant, see your health care provider for a prepregnancy  visit. If told by your health care provider, take low-dose aspirin daily starting at age 19. Find healthy ways to cope with stress, such as: Meditation, yoga, or listening to music. Journaling. Talking to a trusted person. Spending time with friends and family. Safety Always wear your seat belt while driving or riding in a vehicle. Do not drive: If you have been drinking alcohol. Do not ride with someone who has been drinking. When you are tired or distracted. While texting. Wear a helmet and other protective equipment during sports activities. If you have firearms in your house, make sure you follow all gun safety procedures. What's next? Visit your health care provider once a year for an annual wellness visit. Ask your health care provider how often you should have your eyes and teeth checked. Stay up to date on all vaccines. This information is not intended to replace advice given to you by your health care provider. Make sure you discuss any questions you have with your healthcare provider. Document Revised: 04/10/2020 Document Reviewed:  03/18/2018 Elsevier Patient Education  2022 [employer].

## 2021-03-08 NOTE — Assessment & Plan Note (Signed)
Check B12 

## 2021-03-08 NOTE — Assessment & Plan Note (Signed)
Check vitamin D. 

## 2021-03-09 ENCOUNTER — Ambulatory Visit
Admission: RE | Admit: 2021-03-09 | Discharge: 2021-03-09 | Disposition: A | Discharge status: Home / Self Care | Payer: BLUE CROSS/BLUE SHIELD | Source: Ambulatory Visit | Attending: Internal Medicine | Admitting: Internal Medicine

## 2021-03-09 VITALS — BP 165/94 | HR 123 | Temp 98.7°F | Resp 16

## 2021-03-09 DIAGNOSIS — J040 Acute laryngitis: Secondary | ICD-10-CM

## 2021-03-09 MED ORDER — GUAIFENESIN ER 600 MG PO TB12: 600.0000 mg | ORAL_TABLET | Freq: Two times a day (BID) | ORAL | 0 refills | Status: AC

## 2021-03-09 MED ORDER — BENZONATATE 100 MG PO CAPS
100.0000 mg | ORAL_CAPSULE | Freq: Three times a day (TID) | ORAL | 0 refills | Status: DC
Start: 2021-03-09 — End: 2022-02-14

## 2021-03-09 MED ORDER — PREDNISONE 20 MG PO TABS: 20.0000 mg | ORAL_TABLET | Freq: Every day | ORAL | 0 refills | Status: AC

## 2021-03-09 NOTE — ED Provider Notes (Signed)
[facility]    CSN: [id number] Arrival date & time: 03/09/21  1157      History   Chief Complaint No chief complaint on file.   HPI Selena Holt is a 50 y.o. female comes to the urgent care with 3-day history of sore throat and a fever.  Onset was insidious and has been persistent.  Over the past couple of days the patient has started losing her voice.  She was seen by her primary care provider who prescribed nasal spray presumably Flonase and a Z-Pak.  Patient describes severe sore throat and pain on coughing.  Patient's cough is dry.  No fever or chills.  Yesterday she had some tightness in her throat and a feeling of her throat closing up.  No shortness of breath or wheezing.  Poor oral intake.  She denies any dizziness.Marland Kitchen   HPI  Past Medical History:  Diagnosis Date   Allergy    Anemia    Anxiety    Bipolar disorder ([place])    Collapsed lung    "spontaneous" , teenager   Constipation    Depression    Diabetes mellitus without complication ([place])    Glaucoma    Heart murmur    HSV-1 (herpes simplex virus 1) infection    Hypertension    Kidney stones    Meningitis    Scoliosis (and kyphoscoliosis), idiopathic     Patient Active Problem List   Diagnosis Date Noted   Hypertension associated with diabetes ([place]) 03/01/2020   Back pain 05/13/2019   Stress 01/14/2018   Fatty liver 01/08/2018   Anemia, iron deficiency 01/03/2015   Vitamin B 12 deficiency 03/09/2014   Dyslipidemia associated with type 2 diabetes mellitus ([place]) 02/10/2014   Vitamin D deficiency 02/10/2014   T2DM (type 2 diabetes mellitus) ([place]) 11/11/2012    Past Surgical History:  Procedure Laterality Date   CHEST TUBE INSERTION     TYMPANOSTOMY TUBE PLACEMENT      OB History   No obstetric history on file.      Home Medications    Prior to Admission medications   Medication Sig Start Date End Date Taking? Authorizing Provider  benzonatate (TESSALON) 100 MG capsule Take 1  capsule (100 mg total) by mouth every 8 (eight) hours. 03/09/21  Yes Lory Nowaczyk, Myrene Galas, MD  guaiFENesin (MUCINEX) 600 MG 12 hr tablet Take 1 tablet (600 mg total) by mouth 2 (two) times daily for 10 days. 03/09/21 03/19/21 Yes Wilfredo Canterbury, Myrene Galas, MD  predniSONE (DELTASONE) 20 MG tablet Take 1 tablet (20 mg total) by mouth daily for 5 days. 03/09/21 03/14/21 Yes Elton Heid, Myrene Galas, MD  azelastine (ASTELIN) 0.1 % nasal spray Place 2 sprays into both nostrils 2 (two) times daily. 03/08/21   Vivi Barrack, MD  azithromycin [facility]) 250 MG tablet Take 2 tablets on day 1, then 1 tablet daily on days 2 through 5 03/08/21 03/13/21  Vivi Barrack, MD  blood glucose meter kit and supplies KIT Dispense based on patient and insurance preference. Use up to four times daily as directed. (FOR ICD-9 250.00, 250.01). 09/06/18   Vivi Barrack, MD  Cholecalciferol (D3 MAXIMUM STRENGTH) 5000 UNIT/ML LIQD Take 1 mL (5,000 Units total) by mouth daily with supper. Patient taking differently: Take 5,000 Units by mouth. Pt only taking one to two times a week 09/28/14   Nicky Pugh C, NP  Cyanocobalamin (VITAMIN B-12 PO) Take 1,200 mcg by mouth. Pt only taking twice a week  [provider]  glucose blood (ONETOUCH VERIO) test strip Use as instructed to check blood sugar up to 3 times daily 12/27/20   Vivi Barrack, MD  ibuprofen (ADVIL) 200 MG tablet Take 200 mg by mouth every 6 (six) hours as needed.    [provider]  Magnesium 250 MG TABS Take by mouth.    [provider]  metFORMIN (GLUCOPHAGE) 500 MG tablet Take 1 tablet (500 mg total) by mouth 2 (two) times daily with a meal. 03/01/20   Vivi Barrack, MD  metoprolol tartrate (LOPRESSOR) 50 MG tablet Take 1 tablet (50 mg total) by mouth 2 (two) times daily. 01/01/21 04/01/21  Tolia, Sunit, DO  OneTouch Delica Lancets 06C MISC Use daily as needed to check blood sugar. 03/29/19   Vivi Barrack, MD  sucralfate (CARAFATE) 1 g tablet Take 1 tablet  (1 g total) by mouth 4 (four) times daily -  with meals and at bedtime. 12/28/20   Maximiano Coss, NP  vitamin C (ASCORBIC ACID) 500 MG tablet Take 500 mg by mouth daily.    [provider]  cetirizine (ZYRTEC) 10 MG tablet Take 10 mg by mouth daily.  02/23/20  [provider]    Family History Family History  Problem Relation Age of Onset   Hyperlipidemia Mother    Hypertension Mother    Hyperlipidemia Father    Hypertension Father    CAD Father    Heart disease Father    Other Brother        colostomy    Alzheimer's disease Maternal Grandmother 65   Colon cancer Neg Hx    Colon polyps Neg Hx    Esophageal cancer Neg Hx    Rectal cancer Neg Hx    Stomach cancer Neg Hx     Social History Social History   Tobacco Use   Smoking status: Never   Smokeless tobacco: Never  Vaping Use   Vaping Use: Never used  Substance Use Topics   Alcohol use: No    Alcohol/week: 0.0 standard drinks   Drug use: No     Allergies   Tetanus toxoids, Ceclor [cefaclor], Doxycycline, Penicillins, Sulfa antibiotics, and Lisinopril   Review of Systems Review of Systems  Constitutional: Negative.   HENT:  Positive for sore throat. Negative for postnasal drip and sinus pressure.   Respiratory:  Positive for cough and stridor. Negative for shortness of breath and wheezing.   Cardiovascular: Negative.   Gastrointestinal: Negative.     Physical Exam Triage Vital Signs ED Triage Vitals  Enc Vitals Group     BP 03/09/21 1239 (!) 165/94     Pulse Rate 03/09/21 1239 (!) 123     Resp 03/09/21 1239 16     Temp 03/09/21 1239 98.7 F (37.1 C)     Temp Source 03/09/21 1239 Oral     SpO2 03/09/21 1239 96 %     Weight --      Height --      Head Circumference --      Peak Flow --      Pain Score 03/09/21 1242 8     Pain Loc --      Pain Edu? --      Excl. in [place]? --    No data found.  Updated Vital Signs BP (!) 165/94 (BP Location: Right Arm)   Pulse (!) 123   Temp 98.7  F (37.1 C) (Oral)   Resp 16   SpO2  96%   Visual Acuity Right Eye Distance:   Left Eye Distance:   Bilateral Distance:    Right Eye Near:   Left Eye Near:    Bilateral Near:     Physical Exam Vitals reviewed.  Constitutional:      General: She is not in acute distress.    Appearance: She is ill-appearing.  Cardiovascular:     Rate and Rhythm: Normal rate and regular rhythm.     Pulses: Normal pulses.     Heart sounds: Normal heart sounds.  Pulmonary:     Effort: Pulmonary effort is normal. No respiratory distress.     Breath sounds: Normal breath sounds. No stridor. No wheezing or rhonchi.  Abdominal:     General: Bowel sounds are normal.     Palpations: Abdomen is soft.  Neurological:     Mental Status: She is alert.     UC Treatments / Results  Labs (all labs ordered are listed, but only abnormal results are displayed) Labs Reviewed - No data to display  EKG   Radiology No results found.  Procedures Procedures (including critical care time)  Medications Ordered in UC Medications - No data to display  Initial Impression / Assessment and Plan / UC Course  I have reviewed the triage vital signs and the nursing notes.  Pertinent labs & imaging results that were available during my care of the patient were reviewed by me and considered in my medical decision making (see chart for details).     1.  Acute edematous laryngitis: Prednisone for 5 days Increase oral fluid intake Voice rest recommended Continue medications prescribed by your primary care physician Mucinex Tessalon Perles as needed for cough. Return to urgent care if you have any worsening symptoms. Final Clinical Impressions(s) / UC Diagnoses   Final diagnoses:  Acute edematous laryngitis     Discharge Instructions      Please stay hydrated Voice rest Take medications as prescribed Chloraseptic throat spray will help with the throat pain Warm salt water gargle If you experience  noisy breathing, chest tightness or worsening chest pain-please go to the emergency room to be evaluated further.   ED Prescriptions     Medication Sig Dispense Auth. Provider   predniSONE (DELTASONE) 20 MG tablet Take 1 tablet (20 mg total) by mouth daily for 5 days. 5 tablet Nthony Lefferts, Myrene Galas, MD   guaiFENesin (MUCINEX) 600 MG 12 hr tablet Take 1 tablet (600 mg total) by mouth 2 (two) times daily for 10 days. 20 tablet Tamieka Rancourt, Myrene Galas, MD   benzonatate (TESSALON) 100 MG capsule Take 1 capsule (100 mg total) by mouth every 8 (eight) hours. 21 capsule Salihah Peckham, Myrene Galas, MD      PDMP not reviewed this encounter.   Chase Picket, MD 03/09/21 [phone]

## 2021-03-09 NOTE — Discharge Instructions (Addendum)
Please stay hydrated Voice rest Take medications as prescribed Chloraseptic throat spray will help with the throat pain Warm salt water gargle If you experience noisy breathing, chest tightness or worsening chest pain-please go to the emergency room to be evaluated further.

## 2021-03-09 NOTE — ED Triage Notes (Signed)
Sore throat started on Wednesday and fever.  Saw PCP yesterday.  And was given nose spray and a z- pack.  States she lost her voice today.  States she has not taken the nose spray yet.  States she has been taking flonase.   States both ears itch.

## 2021-03-11 ENCOUNTER — Other Ambulatory Visit: Payer: Self-pay | Admitting: Family Medicine

## 2021-03-11 ENCOUNTER — Encounter: Payer: Self-pay | Admitting: Family Medicine

## 2021-03-11 LAB — TSH: TSH: 2.13 mIU/L

## 2021-03-11 LAB — COMPREHENSIVE METABOLIC PANEL
AG Ratio: 2 (calc) (ref 1.0–2.5)
ALT: 497 U/L — ABNORMAL HIGH (ref 6–29)
AST: 190 U/L — ABNORMAL HIGH (ref 10–35)
Albumin: 4.8 g/dL (ref 3.6–5.1)
Alkaline phosphatase (APISO): 313 U/L — ABNORMAL HIGH (ref 31–125)
BUN: 15 mg/dL (ref 7–25)
CO2: 25 mmol/L (ref 20–32)
Calcium: 9.6 mg/dL (ref 8.6–10.2)
Chloride: 102 mmol/L (ref 98–110)
Creat: 0.69 mg/dL (ref 0.50–0.99)
Globulin: 2.4 g/dL (calc) (ref 1.9–3.7)
Glucose, Bld: 165 mg/dL — ABNORMAL HIGH (ref 65–99)
Potassium: 3.6 mmol/L (ref 3.5–5.3)
Sodium: 137 mmol/L (ref 135–146)
Total Bilirubin: 0.4 mg/dL (ref 0.2–1.2)
Total Protein: 7.2 g/dL (ref 6.1–8.1)

## 2021-03-11 LAB — IRON,TIBC AND FERRITIN PANEL
%SAT: 11 % (calc) — ABNORMAL LOW (ref 16–45)
Ferritin: 161 ng/mL (ref 16–232)
Iron: 38 ug/dL — ABNORMAL LOW (ref 40–190)
TIBC: 360 mcg/dL (calc) (ref 250–450)

## 2021-03-11 LAB — ANA: Anti Nuclear Antibody (ANA): POSITIVE — AB

## 2021-03-11 LAB — VITAMIN D 25 HYDROXY (VIT D DEFICIENCY, FRACTURES): Vit D, 25-Hydroxy: 90 ng/mL (ref 30–100)

## 2021-03-11 LAB — CBC
HCT: 38.4 % (ref 35.0–45.0)
Hemoglobin: 12.8 g/dL (ref 11.7–15.5)
MCH: 31.4 pg (ref 27.0–33.0)
MCHC: 33.3 g/dL (ref 32.0–36.0)
MCV: 94.3 fL (ref 80.0–100.0)
MPV: 10.9 fL (ref 7.5–12.5)
Platelets: 212 10*3/uL (ref 140–400)
RBC: 4.07 10*6/uL (ref 3.80–5.10)
RDW: 11.8 % (ref 11.0–15.0)
WBC: 5.9 10*3/uL (ref 3.8–10.8)

## 2021-03-11 LAB — MICROALBUMIN / CREATININE URINE RATIO
Creatinine, Urine: 26 mg/dL (ref 20–275)
Microalb Creat Ratio: 123 mcg/mg creat — ABNORMAL HIGH (ref <30)
Microalb, Ur: 3.2 mg/dL

## 2021-03-11 LAB — LIPID PANEL
Cholesterol: 182 mg/dL (ref <200)
HDL: 51 mg/dL (ref >=50)
LDL Cholesterol (Calc): 102 mg/dL (calc) — ABNORMAL HIGH
Non-HDL Cholesterol (Calc): 131 mg/dL (calc) — ABNORMAL HIGH (ref <130)
Total CHOL/HDL Ratio: 3.6 (calc) (ref <5.0)
Triglycerides: 176 mg/dL — ABNORMAL HIGH (ref <150)

## 2021-03-11 LAB — ANTI-NUCLEAR AB-TITER (ANA TITER)
ANA TITER: 1:80 {titer} — ABNORMAL HIGH
ANA Titer 1: 1:40 {titer} — ABNORMAL HIGH

## 2021-03-11 LAB — MAGNESIUM: Magnesium: 1.9 mg/dL (ref 1.5–2.5)

## 2021-03-11 LAB — HEMOGLOBIN A1C
Hgb A1c MFr Bld: 6.3 % of total Hgb — ABNORMAL HIGH (ref <5.7)
Mean Plasma Glucose: 134 mg/dL
eAG (mmol/L): 7.4 mmol/L

## 2021-03-11 LAB — VITAMIN B12: Vitamin B-12: >2000 pg/mL — ABNORMAL HIGH (ref 200–1100)

## 2021-03-11 NOTE — Telephone Encounter (Signed)
Pt called and stated that she saw Dr [name] on 8/19 and she went to the urgent care on 8/20. She stated that she is not any better. She sent a message through MyChart and was wondering if anyone has seen it. Please Advise.

## 2021-03-12 ENCOUNTER — Telehealth: Payer: Self-pay

## 2021-03-12 NOTE — Progress Notes (Signed)
Please inform patient of the following:  Her liver numbers are very high.  Please have her come back ASAP to recheck c-Met and CK.   Her A1c and cholesterol levels are borderline but stable.  Her ANA was very mildly positive.  This is nonspecific.  Do not need to do any further investigation at this point.

## 2021-03-12 NOTE — Telephone Encounter (Signed)
Pt was seen in ED on 03/09/2021.

## 2021-03-12 NOTE — Telephone Encounter (Signed)
Patient returned call regarding lab results

## 2021-03-13 ENCOUNTER — Other Ambulatory Visit: Payer: Self-pay

## 2021-03-13 ENCOUNTER — Other Ambulatory Visit (INDEPENDENT_AMBULATORY_CARE_PROVIDER_SITE_OTHER): Payer: BLUE CROSS/BLUE SHIELD

## 2021-03-13 DIAGNOSIS — Z0001 Encounter for general adult medical examination with abnormal findings: Secondary | ICD-10-CM

## 2021-03-14 LAB — COMPREHENSIVE METABOLIC PANEL
ALT: 111 U/L — ABNORMAL HIGH (ref 0–35)
AST: 30 U/L (ref 0–37)
Albumin: 4.4 g/dL (ref 3.5–5.2)
Alkaline Phosphatase: 199 U/L — ABNORMAL HIGH (ref 39–117)
BUN: 16 mg/dL (ref 6–23)
CO2: 24 mEq/L (ref 19–32)
Calcium: 9.9 mg/dL (ref 8.4–10.5)
Chloride: 96 mEq/L (ref 96–112)
Creatinine, Ser: 0.71 mg/dL (ref 0.40–1.20)
GFR: 100.36 mL/min (ref >60.00)
Glucose, Bld: 119 mg/dL — ABNORMAL HIGH (ref 70–99)
Potassium: 3.8 mEq/L (ref 3.5–5.1)
Sodium: 132 mEq/L — ABNORMAL LOW (ref 135–145)
Total Bilirubin: 0.5 mg/dL (ref 0.2–1.2)
Total Protein: 7.5 g/dL (ref 6.0–8.3)

## 2021-03-14 LAB — CK: Total CK: 35 U/L (ref 7–177)

## 2021-03-15 ENCOUNTER — Telehealth: Payer: Self-pay

## 2021-03-15 NOTE — Telephone Encounter (Signed)
Patient called in asking about her lab results, advised Dr.Parker was out of the office until Monday.

## 2021-03-19 NOTE — Progress Notes (Signed)
Please inform patient of the following:  Her liver numbers are better but still abnormal.  Recommend checking right upper quadrant ultrasound to further assess her liver.  Please place future order.

## 2021-03-19 NOTE — Telephone Encounter (Signed)
See note

## 2021-03-19 NOTE — Telephone Encounter (Signed)
Please see result note.  Selena Holt. [name], MD 03/19/2021 4:21 PM

## 2021-03-20 ENCOUNTER — Encounter: Payer: Self-pay | Admitting: Family Medicine

## 2021-03-20 ENCOUNTER — Telehealth: Payer: Self-pay | Admitting: *Deleted

## 2021-03-20 ENCOUNTER — Other Ambulatory Visit: Payer: Self-pay | Admitting: *Deleted

## 2021-03-20 DIAGNOSIS — R748 Abnormal levels of other serum enzymes: Secondary | ICD-10-CM

## 2021-03-20 NOTE — Telephone Encounter (Signed)
Yes it is possible. We can recheck her labs again in a few weeks if she wishes to do this instead of getting the ultrasound.  Selena Holt. [name], MD 03/20/2021 9:46 AM

## 2021-03-20 NOTE — Telephone Encounter (Signed)
Left message to return call to our office at their convenience.  See results note

## 2021-03-20 NOTE — Telephone Encounter (Signed)
Responded to pt via mychart

## 2021-03-20 NOTE — Telephone Encounter (Signed)
Patient positive for Covid on 03/19/2021 stated no symptoms at this moment  Want to know if lab results, elevated liver enzymes, will be the cause of  been Covid positive please advise

## 2021-03-26 ENCOUNTER — Encounter: Payer: Self-pay | Admitting: Family Medicine

## 2021-03-26 ENCOUNTER — Telehealth: Payer: Self-pay

## 2021-03-26 NOTE — Telephone Encounter (Signed)
See below

## 2021-03-26 NOTE — Telephone Encounter (Signed)
Patient is calling in stating that she would like to speak with Francena Hanly to see if it is okay to return back to work.

## 2021-03-26 NOTE — Telephone Encounter (Signed)
Spoke with patient stated symptoms a a bit better but still with night sweat  Feeling hot and cold. No elevated temperature  patient has to continue with quarantine and what she need to do next  Please advise

## 2021-03-29 ENCOUNTER — Other Ambulatory Visit: Payer: Self-pay | Admitting: Cardiology

## 2021-03-29 DIAGNOSIS — R000 Tachycardia, unspecified: Secondary | ICD-10-CM

## 2021-03-29 DIAGNOSIS — R072 Precordial pain: Secondary | ICD-10-CM

## 2021-03-29 NOTE — Telephone Encounter (Signed)
Spoke with patient, patient doing a little bit better  She is back at work  Requesting to have Blood work repeated before doing CT  Please advise

## 2021-03-29 NOTE — Telephone Encounter (Signed)
Ok to order CMET.  Katina Degree. [name], MD 03/29/2021 4:26 PM

## 2021-03-29 NOTE — Telephone Encounter (Signed)
Left message to return call to our office at their convenience.  

## 2021-04-01 ENCOUNTER — Other Ambulatory Visit (INDEPENDENT_AMBULATORY_CARE_PROVIDER_SITE_OTHER): Payer: BLUE CROSS/BLUE SHIELD

## 2021-04-01 ENCOUNTER — Other Ambulatory Visit: Payer: Self-pay

## 2021-04-01 ENCOUNTER — Other Ambulatory Visit: Payer: Self-pay | Admitting: *Deleted

## 2021-04-01 DIAGNOSIS — R748 Abnormal levels of other serum enzymes: Secondary | ICD-10-CM

## 2021-04-01 NOTE — Telephone Encounter (Signed)
Order placed

## 2021-04-02 LAB — COMPREHENSIVE METABOLIC PANEL
ALT: 106 U/L — ABNORMAL HIGH (ref 0–35)
AST: 39 U/L — ABNORMAL HIGH (ref 0–37)
Albumin: 4.9 g/dL (ref 3.5–5.2)
Alkaline Phosphatase: 163 U/L — ABNORMAL HIGH (ref 39–117)
BUN: 15 mg/dL (ref 6–23)
CO2: 26 mEq/L (ref 19–32)
Calcium: 10.6 mg/dL — ABNORMAL HIGH (ref 8.4–10.5)
Chloride: 95 mEq/L — ABNORMAL LOW (ref 96–112)
Creatinine, Ser: 0.68 mg/dL (ref 0.40–1.20)
GFR: 102.76 mL/min (ref >60.00)
Glucose, Bld: 120 mg/dL — ABNORMAL HIGH (ref 70–99)
Potassium: 3.7 mEq/L (ref 3.5–5.1)
Sodium: 134 mEq/L — ABNORMAL LOW (ref 135–145)
Total Bilirubin: 0.7 mg/dL (ref 0.2–1.2)
Total Protein: 7.6 g/dL (ref 6.0–8.3)

## 2021-04-03 NOTE — Progress Notes (Signed)
Please inform patient of the following:  Her liver numbers have not changed significantly since last check. Recommend RUQ ultrasound to look at her liver and gallbladder.  Selena Holt. [name], MD 04/03/2021 7:58 AM

## 2021-04-09 ENCOUNTER — Ambulatory Visit
Admission: RE | Admit: 2021-04-09 | Discharge: 2021-04-09 | Disposition: A | Discharge status: Home / Self Care | Payer: BLUE CROSS/BLUE SHIELD | Source: Ambulatory Visit | Attending: Family Medicine | Admitting: Family Medicine

## 2021-04-09 ENCOUNTER — Ambulatory Visit: Payer: BLUE CROSS/BLUE SHIELD | Admitting: Family Medicine

## 2021-04-09 DIAGNOSIS — R748 Abnormal levels of other serum enzymes: Secondary | ICD-10-CM

## 2021-04-10 ENCOUNTER — Other Ambulatory Visit: Payer: Self-pay | Admitting: *Deleted

## 2021-04-10 DIAGNOSIS — R748 Abnormal levels of other serum enzymes: Secondary | ICD-10-CM

## 2021-04-10 DIAGNOSIS — K760 Fatty (change of) liver, not elsewhere classified: Secondary | ICD-10-CM

## 2021-04-10 NOTE — Progress Notes (Signed)
Please inform patient of the following:  Her ultrasound shows inflammation in her liver. It is possible that this could be due to covid, however it is something that needs to be followed closely. Given that her numbers have been elevated for over a month. I think it would be a good idea for her to see GI. Please place referral.  I would like to check one more set of labs while we wait on the referral. Please place order for CMET, Hep A Ab IGM, Hep B surface Ag, Hep B surface Ab, hep C Ab, and HIV.  Katina Degree. [name], MD 04/10/2021 10:04 AM

## 2021-04-10 NOTE — Progress Notes (Signed)
hi

## 2021-04-15 ENCOUNTER — Other Ambulatory Visit: Payer: Self-pay

## 2021-04-15 ENCOUNTER — Other Ambulatory Visit (INDEPENDENT_AMBULATORY_CARE_PROVIDER_SITE_OTHER): Payer: BLUE CROSS/BLUE SHIELD

## 2021-04-15 DIAGNOSIS — R748 Abnormal levels of other serum enzymes: Secondary | ICD-10-CM

## 2021-04-16 LAB — REFLEX TIQ

## 2021-04-16 LAB — COMPREHENSIVE METABOLIC PANEL
ALT: 189 U/L — ABNORMAL HIGH (ref 0–35)
AST: 76 U/L — ABNORMAL HIGH (ref 0–37)
Albumin: 4.8 g/dL (ref 3.5–5.2)
Alkaline Phosphatase: 151 U/L — ABNORMAL HIGH (ref 39–117)
BUN: 15 mg/dL (ref 6–23)
CO2: 26 mEq/L (ref 19–32)
Calcium: 9.9 mg/dL (ref 8.4–10.5)
Chloride: 99 mEq/L (ref 96–112)
Creatinine, Ser: 0.64 mg/dL (ref 0.40–1.20)
GFR: 104.25 mL/min (ref >60.00)
Glucose, Bld: 131 mg/dL — ABNORMAL HIGH (ref 70–99)
Potassium: 3.5 mEq/L (ref 3.5–5.1)
Sodium: 135 mEq/L (ref 135–145)
Total Bilirubin: 0.5 mg/dL (ref 0.2–1.2)
Total Protein: 7.2 g/dL (ref 6.0–8.3)

## 2021-04-16 LAB — ACUTE HEP PANEL AND HEP B SURFACE AB
HEPATITIS C ANTIBODY REFILL$(REFL): NONREACTIVE
Hep A IgM: NONREACTIVE
Hep B C IgM: NONREACTIVE
Hepatitis B Surface Ag: NONREACTIVE
SIGNAL TO CUT-OFF: 0.01 (ref <1.00)

## 2021-04-16 LAB — HIV ANTIBODY (ROUTINE TESTING W REFLEX): HIV 1&2 Ab, 4th Generation: NONREACTIVE

## 2021-04-17 ENCOUNTER — Other Ambulatory Visit: Payer: Self-pay | Admitting: *Deleted

## 2021-04-17 NOTE — Progress Notes (Signed)
Please inform patient of the following:  Her liver numbers are still significantly elevated. Can we see if we can get her into GI sooner? If not we can try to get her in to [facility].

## 2021-04-30 ENCOUNTER — Other Ambulatory Visit: Payer: Self-pay

## 2021-04-30 ENCOUNTER — Ambulatory Visit (INDEPENDENT_AMBULATORY_CARE_PROVIDER_SITE_OTHER): Payer: BLUE CROSS/BLUE SHIELD | Admitting: Family Medicine

## 2021-04-30 VITALS — BP 131/83 | HR 90 | Temp 98.3°F | Ht 64.0 in | Wt 135.2 lb

## 2021-04-30 DIAGNOSIS — M25512 Pain in left shoulder: Secondary | ICD-10-CM | POA: Diagnosis not present

## 2021-04-30 DIAGNOSIS — K7689 Other specified diseases of liver: Secondary | ICD-10-CM | POA: Diagnosis not present

## 2021-04-30 DIAGNOSIS — M25511 Pain in right shoulder: Secondary | ICD-10-CM | POA: Diagnosis not present

## 2021-04-30 DIAGNOSIS — E119 Type 2 diabetes mellitus without complications: Secondary | ICD-10-CM

## 2021-04-30 NOTE — Patient Instructions (Signed)
It was very nice to see you today!  I believe you have some inflammation in your rotator cuffs as well as in your lower back.  Please work on the exercises and stretches.  Let me know if not improving in the next couple weeks.  Take care, Dr [name]  PLEASE NOTE:  If you had any lab tests please let us know if you have not heard back within a few days. You may see your results on mychart before we have a chance to review them but we will give you a call once they are reviewed by [place]. If we ordered any referrals today, please let us know if you have not heard from their office within the next week.   Please try these tips to maintain a healthy lifestyle:  Eat at least 3 REAL meals and 1-2 snacks per day.  Aim for no more than 5 hours between eating.  If you eat breakfast, please do so within one hour of getting up.   Each meal should contain half fruits/vegetables, one quarter protein, and one quarter carbs (no bigger than a computer mouse)  Cut down on sweet beverages. This includes juice, soda, and sweet tea.   Drink at least 1 glass of water with each meal and aim for at least 8 glasses per day  Exercise at least 150 minutes every week.

## 2021-04-30 NOTE — Assessment & Plan Note (Signed)
Last A1c at goal metformin 500 mg twice daily.  We can recheck again next office visit.

## 2021-04-30 NOTE — Progress Notes (Signed)
   Selena Holt is a 50 y.o. female who presents today for an office visit.  Assessment/Plan:  New/Acute Problems: Bilateral shoulder pain Likely rotator cuff tendinopathy.  No red flags.  Discussed home exercises and handout was given.  We will avoid NSAIDs for now.  Can consider referral to PT or sports medicine if not improving  Sciatica Positive straight leg raise on exam today.  No red flag signs or symptoms.  Possibly could be related to recent COVID illness illness or back strain.  We discussed home exercises and handout was given.  As above we will be avoiding NSAIDs until she completes her GI evaluation.  If not improving can consider referral to PT or sports medicine.  Chronic Problems Addressed Today: T2DM (type 2 diabetes mellitus) (HCC) Last A1c at goal metformin 500 mg twice daily.  We can recheck again next office visit.  Hepatocellular dysfunction Unclear etiology.  Possibly could be related to recent COVID infection would expect her transaminases to improve.  She has an upcoming appointment with liver clinic in a couple of weeks.     Subjective:  HPI:  She complain of left shoulder pain. This started about few months ago. She is concerned it might due to her scoliosis. She define pain sharp and achy. Worse in the morning. She sleep with her arm under her head. She has tried Ibuprofen with mild relief. No numbness or tingling in arm. She would like to try exercises for this issue.  She also feel her right big toe goes numb when walking. No recent injury or fall. This started after COVID. No numbness or tingling in her feet. She has had spasm in her back when she was first diagnosed with COVID. She noticed electrical pain in her neck. She report pain goes to her arms.         Objective:  Physical Exam: BP 131/83   Pulse 90   Temp 98.3 F (36.8 C) (Temporal)   Ht 5\' 4"  (1.626 m)   Wt 135 lb 3.2 oz (61.3 kg)   SpO2 100%   BMI 23.21 kg/m   Gen: No acute  distress, resting comfortably CV: Regular rate and rhythm with no murmurs appreciated Pulm: Normal work of breathing, clear to auscultation bilaterally with no crackles, wheezes, or rhonchi MSK:  - Back: Scoliosis noted.  No point tenderness.  No bony tenderness - Left arm: No deformities.  Full range of motion throughout.  Slight pain with resisted supraspinatus testing.  Pain also elicited with internal rotation.  Normal external rotation.  Neurovascular intact distally -Right arm: No deformities.  Full range of motion throughout.  Slight pain with resisted supraspinatus testing.  Normal internal and external rotation.  Neurovascular intact distally -Right leg: No deformities.  Good distal pulses.  Sensation to light touch intact throughout.  Straight leg raise positive Neuro: Grossly normal, moves all extremities Psych: Normal affect and thought content       I,Savera Zaman,acting as a scribe for , MD.,have documented all relevant documentation on the behalf of Jacquiline Doe, MD,as directed by  Jacquiline Doe, MD while in the presence of Jacquiline Doe, MD.   I, Jacquiline Doe, MD, have reviewed all documentation for this visit. The documentation on 04/30/21 for the exam, diagnosis, procedures, and orders are all accurate and complete.  06/30/21. Katina Degree, MD 04/30/2021 3:10 PM

## 2021-04-30 NOTE — Assessment & Plan Note (Signed)
Unclear etiology.  Possibly could be related to recent COVID infection would expect her transaminases to improve.  She has an upcoming appointment with liver clinic in a couple of weeks.

## 2021-05-13 NOTE — Telephone Encounter (Signed)
This concern has been previously addressed by myself and/or another provider.  If they patient has ongoing concerns, they can contact me at their convenience.  Thank you,  Rich Clayvon Parlett, NP 

## 2021-05-28 ENCOUNTER — Ambulatory Visit: Payer: BLUE CROSS/BLUE SHIELD | Admitting: Gastroenterology

## 2021-09-09 ENCOUNTER — Telehealth: Payer: Self-pay | Admitting: Family Medicine

## 2021-09-09 DIAGNOSIS — N950 Postmenopausal bleeding: Secondary | ICD-10-CM | POA: Diagnosis not present

## 2021-09-09 DIAGNOSIS — N760 Acute vaginitis: Secondary | ICD-10-CM | POA: Diagnosis not present

## 2021-09-09 LAB — HEMOGLOBIN A1C: Hemoglobin A1C: 6.2

## 2021-09-09 NOTE — Telephone Encounter (Signed)
I called pt to schedule an appt and she declined stating she cannot take time off of work to come in.

## 2021-09-09 NOTE — Telephone Encounter (Signed)
Patient need OV with Dr [name] for DM F/U

## 2021-09-09 NOTE — Telephone Encounter (Signed)
Please have her schedule an appointment.  Selena Holt. [name], MD 09/09/2021 9:36 AM

## 2021-09-09 NOTE — Telephone Encounter (Signed)
Pt states she needs to have her A1C and a metabolic panel done. She states Dr [name] schedules one every so many months for her. Please Advise

## 2021-09-16 ENCOUNTER — Encounter: Payer: Self-pay | Admitting: Family Medicine

## 2021-11-15 DIAGNOSIS — R748 Abnormal levels of other serum enzymes: Secondary | ICD-10-CM | POA: Diagnosis not present

## 2021-11-15 DIAGNOSIS — K760 Fatty (change of) liver, not elsewhere classified: Secondary | ICD-10-CM | POA: Diagnosis not present

## 2022-02-14 ENCOUNTER — Encounter: Payer: Self-pay | Admitting: Family

## 2022-02-14 ENCOUNTER — Ambulatory Visit (INDEPENDENT_AMBULATORY_CARE_PROVIDER_SITE_OTHER): Payer: Federal, State, Local not specified - PPO | Admitting: Family

## 2022-02-14 VITALS — BP 156/86 | HR 87 | Temp 98.1°F | Ht 64.0 in | Wt 135.0 lb

## 2022-02-14 DIAGNOSIS — N760 Acute vaginitis: Secondary | ICD-10-CM

## 2022-02-14 DIAGNOSIS — R102 Pelvic and perineal pain: Secondary | ICD-10-CM

## 2022-02-14 LAB — POCT URINALYSIS DIPSTICK
Bilirubin, UA: NEGATIVE
Blood, UA: NEGATIVE
Glucose, UA: NEGATIVE
Ketones, UA: NEGATIVE
Leukocytes, UA: NEGATIVE
Nitrite, UA: NEGATIVE
Protein, UA: NEGATIVE
Spec Grav, UA: >=1.03 — AB (ref 1.010–1.025)
Urobilinogen, UA: 0.2 E.U./dL
pH, UA: 6 (ref 5.0–8.0)

## 2022-02-14 NOTE — Patient Instructions (Addendum)
It was very nice to see you today!  I have sent Metronidazole to treat your bacterial vaginosis. Take this twice a day after eating. OK to take up to 3 Ibuprofen 3 times per day, or 2 generic Aleve twice a day for the pain.  Call next week if you are still having symptoms.  Have a great weekend :-)    PLEASE NOTE:  If you had any lab tests please let us know if you have not heard back within a few days. You may see your results on MyChart before we have a chance to review them but we will give you a call once they are reviewed by [place]. If we ordered any referrals today, please let us know if you have not heard from their office within the next week.

## 2022-02-14 NOTE — Progress Notes (Signed)
Patient ID: NAOME BRIGANDI, female    DOB: 04-Apr-1971, 51 y.o.   MRN: [id number]  Chief Complaint  Patient presents with   Leg Pain    Pt c/o right leg pain since last night, Pt states not injury occurred. Pain is sharp, Pt has tried ibuprofen which does help it a little. Occurred randomly in upper thigh are and she was not able to put pressure on leg.    Urinary Frequency    Pt c/o urinary frequency, yellow discharge for about 2 weeks. Has not tried anything for this.     HPI: Right groin pain:  started yesterday, vaginal discharge, thin & yellow, also some pelvic pain, she called her OB who called in an antifungal for her but she was questioning if that is what she has, denies any white discharge, mild itching & irritation.     Assessment & Plan:  1. Pelvic pain UA negative. Advised on drinking 2 liters of water daily.  - POCT Urinalysis Dipstick  2. Acute vaginitis Unable to send urine for BV testing today as our lab is closed. Advised pt based on her sx it sounds like BV and recommend treating empirically. Sending Flagyl, advised on use & SE, call next week if sx are not resolving.   Subjective:    Outpatient Medications Prior to Visit  Medication Sig Dispense Refill   azelastine (ASTELIN) 0.1 % nasal spray Place 2 sprays into both nostrils 2 (two) times daily. 30 mL 12   blood glucose meter kit and supplies KIT Dispense based on patient and insurance preference. Use up to four times daily as directed. (FOR ICD-9 250.00, 250.01). 1 each 0   Cyanocobalamin (VITAMIN B-12 PO) Take 1,200 mcg by mouth. Pt only taking twice a week     glucose blood (ONETOUCH VERIO) test strip Use as instructed to check blood sugar up to 3 times daily 300 each 0   ibuprofen (ADVIL) 200 MG tablet Take 200 mg by mouth every 6 (six) hours as needed.     Magnesium 250 MG TABS Take by mouth.     OneTouch Delica Lancets 15A MISC Use daily as needed to check blood sugar. 100 each 2   benzonatate  (TESSALON) 100 MG capsule Take 1 capsule (100 mg total) by mouth every 8 (eight) hours. 21 capsule 0   Cholecalciferol (D3 MAXIMUM STRENGTH) 5000 UNIT/ML LIQD Take 1 mL (5,000 Units total) by mouth daily with supper. (Patient taking differently: Take 5,000 Units by mouth. Pt only taking one to two times a week) 30 mL 2   metoprolol tartrate (LOPRESSOR) 50 MG tablet TAKE 1 TABLET BY MOUTH TWICE A DAY 180 tablet 0   sucralfate (CARAFATE) 1 g tablet Take 1 tablet (1 g total) by mouth 4 (four) times daily -  with meals and at bedtime. 40 tablet 0   vitamin C (ASCORBIC ACID) 500 MG tablet Take 500 mg by mouth daily.     metFORMIN (GLUCOPHAGE) 500 MG tablet TAKE 1 TABLET (500 MG TOTAL) BY MOUTH 2 (TWO) TIMES DAILY WITH A MEAL. (Patient not taking: Reported on 02/14/2022) 180 tablet 3   No facility-administered medications prior to visit.   Past Medical History:  Diagnosis Date   Allergy    Anemia    Anxiety    Bipolar disorder ([place])    Collapsed lung    "spontaneous" , teenager   Constipation    Depression    Diabetes mellitus without complication ([place])    Glaucoma  Heart murmur    HSV-1 (herpes simplex virus 1) infection    Hypertension    Kidney stones    Meningitis    Scoliosis (and kyphoscoliosis), idiopathic    Past Surgical History:  Procedure Laterality Date   CHEST TUBE INSERTION     TYMPANOSTOMY TUBE PLACEMENT     Allergies  Allergen Reactions   Tetanus Toxoids Anaphylaxis    tetanus   Ceclor [Cefaclor] Hives   Doxycycline Other (See Comments)    Hives   Penicillins Hives   Sulfa Antibiotics Hives   Lisinopril Other (See Comments)    fatigue      Objective:    Physical Exam Vitals and nursing note reviewed.  Constitutional:      Appearance: Normal appearance.  Cardiovascular:     Rate and Rhythm: Normal rate and regular rhythm.  Pulmonary:     Effort: Pulmonary effort is normal.     Breath sounds: Normal breath sounds.  Musculoskeletal:        General:  Normal range of motion.  Skin:    General: Skin is warm and dry.  Neurological:     Mental Status: She is alert.  Psychiatric:        Mood and Affect: Mood normal.        Behavior: Behavior normal.    BP (!) 156/86 (BP Location: Left Arm, Patient Position: Sitting, Cuff Size: Large)   Pulse 87   Temp 98.1 F (36.7 C) (Temporal)   Ht 5' 4"  (1.626 m)   Wt 135 lb (61.2 kg)   LMP  (LMP Unknown)   SpO2 100%   BMI 23.17 kg/m  Wt Readings from Last 3 Encounters:  02/14/22 135 lb (61.2 kg)  04/30/21 135 lb 3.2 oz (61.3 kg)  03/08/21 137 lb 6.4 oz (62.3 kg)       Jeanie Sewer, NP

## 2022-02-18 ENCOUNTER — Telehealth: Payer: Self-pay | Admitting: Gastroenterology

## 2022-02-18 NOTE — Telephone Encounter (Signed)
Inbound call from patient stating that this morning she started to have blood clots in her stool that was bright red and has been spotting from it all day and has been having a pulling feeling in her stomach for a few weeks that is on the right side of the stomach. Patient is requesting a call back to discuss. Please advise.

## 2022-02-19 NOTE — Telephone Encounter (Signed)
Returned call to patient. I left her a detailed vm letting her know that she has never been seen here before so she would need to be scheduled for a new patient appt and we are booking weeks out at this time. I told pt that if she is having significant symptoms she will need to be evaluated at an urgent care or ED. I told the patient to call us back if she would like to schedule an appt.

## 2022-02-21 ENCOUNTER — Ambulatory Visit: Payer: Federal, State, Local not specified - PPO | Admitting: Family Medicine

## 2022-02-21 ENCOUNTER — Encounter: Payer: Self-pay | Admitting: Family Medicine

## 2022-02-21 ENCOUNTER — Encounter: Payer: Self-pay | Admitting: Gastroenterology

## 2022-02-21 VITALS — BP 130/81 | HR 80 | Temp 98.1°F | Ht 64.0 in | Wt 135.8 lb

## 2022-02-21 DIAGNOSIS — E1169 Type 2 diabetes mellitus with other specified complication: Secondary | ICD-10-CM

## 2022-02-21 DIAGNOSIS — K625 Hemorrhage of anus and rectum: Secondary | ICD-10-CM

## 2022-02-21 DIAGNOSIS — K7689 Other specified diseases of liver: Secondary | ICD-10-CM

## 2022-02-21 DIAGNOSIS — E538 Deficiency of other specified B group vitamins: Secondary | ICD-10-CM

## 2022-02-21 DIAGNOSIS — I152 Hypertension secondary to endocrine disorders: Secondary | ICD-10-CM

## 2022-02-21 DIAGNOSIS — E785 Hyperlipidemia, unspecified: Secondary | ICD-10-CM

## 2022-02-21 DIAGNOSIS — E119 Type 2 diabetes mellitus without complications: Secondary | ICD-10-CM

## 2022-02-21 DIAGNOSIS — R1030 Lower abdominal pain, unspecified: Secondary | ICD-10-CM

## 2022-02-21 DIAGNOSIS — E1159 Type 2 diabetes mellitus with other circulatory complications: Secondary | ICD-10-CM

## 2022-02-21 DIAGNOSIS — E559 Vitamin D deficiency, unspecified: Secondary | ICD-10-CM | POA: Diagnosis not present

## 2022-02-21 LAB — URINALYSIS, ROUTINE W REFLEX MICROSCOPIC
Bilirubin Urine: NEGATIVE
Hgb urine dipstick: NEGATIVE
Ketones, ur: NEGATIVE
Leukocytes,Ua: NEGATIVE
Nitrite: NEGATIVE
Specific Gravity, Urine: <=1.005 — AB (ref 1.000–1.030)
Total Protein, Urine: NEGATIVE
Urine Glucose: NEGATIVE
Urobilinogen, UA: 0.2 (ref 0.0–1.0)
pH: 6.5 (ref 5.0–8.0)

## 2022-02-21 LAB — HEMOGLOBIN A1C: Hgb A1c MFr Bld: 6.1 % (ref 4.6–6.5)

## 2022-02-21 LAB — CBC
HCT: 36.7 % (ref 36.0–46.0)
Hemoglobin: 12.8 g/dL (ref 12.0–15.0)
MCHC: 34.8 g/dL (ref 30.0–36.0)
MCV: 91.3 fl (ref 78.0–100.0)
Platelets: 257 10*3/uL (ref 150.0–400.0)
RBC: 4.02 Mil/uL (ref 3.87–5.11)
RDW: 12.9 % (ref 11.5–15.5)
WBC: 7.9 10*3/uL (ref 4.0–10.5)

## 2022-02-21 LAB — LIPID PANEL
Cholesterol: 176 mg/dL (ref 0–200)
HDL: 41.9 mg/dL (ref >39.00)
LDL Cholesterol: 114 mg/dL — ABNORMAL HIGH (ref 0–99)
NonHDL: 133.64
Total CHOL/HDL Ratio: 4
Triglycerides: 96 mg/dL (ref 0.0–149.0)
VLDL: 19.2 mg/dL (ref 0.0–40.0)

## 2022-02-21 LAB — COMPREHENSIVE METABOLIC PANEL
ALT: 42 U/L — ABNORMAL HIGH (ref 0–35)
AST: 27 U/L (ref 0–37)
Albumin: 4.5 g/dL (ref 3.5–5.2)
Alkaline Phosphatase: 84 U/L (ref 39–117)
BUN: 12 mg/dL (ref 6–23)
CO2: 27 mEq/L (ref 19–32)
Calcium: 9.6 mg/dL (ref 8.4–10.5)
Chloride: 101 mEq/L (ref 96–112)
Creatinine, Ser: 0.71 mg/dL (ref 0.40–1.20)
GFR: 99.7 mL/min (ref >60.00)
Glucose, Bld: 130 mg/dL — ABNORMAL HIGH (ref 70–99)
Potassium: 5 mEq/L (ref 3.5–5.1)
Sodium: 136 mEq/L (ref 135–145)
Total Bilirubin: 0.7 mg/dL (ref 0.2–1.2)
Total Protein: 7 g/dL (ref 6.0–8.3)

## 2022-02-21 LAB — VITAMIN D 25 HYDROXY (VIT D DEFICIENCY, FRACTURES): VITD: 38.2 ng/mL (ref 30.00–100.00)

## 2022-02-21 LAB — TSH: TSH: 1.99 u[IU]/mL (ref 0.35–5.50)

## 2022-02-21 LAB — VITAMIN B12: Vitamin B-12: 651 pg/mL (ref 211–911)

## 2022-02-21 NOTE — Assessment & Plan Note (Signed)
Check vitamin D. 

## 2022-02-21 NOTE — Assessment & Plan Note (Signed)
Has followed with liver clinic last year following an episode of elevated LFTs.  She had work-up there that was reassuring.  Possibly due to COVID infection last year.  She will follow-up with them again in a couple of months.  We will check LFTs today on her c-Met. 

## 2022-02-21 NOTE — Assessment & Plan Note (Signed)
At goal today off medications. 

## 2022-02-21 NOTE — Patient Instructions (Signed)
It was very nice to see you today!  We will refer you to GI for evaluation for your hemorrhoids and rectal bleeding.  We will check blood work and urine sample today.  Please continue work on diet and exercise.  We will probably see you back in 3 to 6 months depending on results of blood work.  Take care, Dr [name]  PLEASE NOTE:  If you had any lab tests please let us know if you have not heard back within a few days. You may see your results on mychart before we have a chance to review them but we will give you a call once they are reviewed by [place]. If we ordered any referrals today, please let us know if you have not heard from their office within the next week.   Please try these tips to maintain a healthy lifestyle:  Eat at least 3 REAL meals and 1-2 snacks per day.  Aim for no more than 5 hours between eating.  If you eat breakfast, please do so within one hour of getting up.   Each meal should contain half fruits/vegetables, one quarter protein, and one quarter carbs (no bigger than a computer mouse)  Cut down on sweet beverages. This includes juice, soda, and sweet tea.   Drink at least 1 glass of water with each meal and aim for at least 8 glasses per day  Exercise at least 150 minutes every week.

## 2022-02-21 NOTE — Assessment & Plan Note (Signed)
Check B12 

## 2022-02-21 NOTE — Assessment & Plan Note (Signed)
Check lipids 

## 2022-02-21 NOTE — Assessment & Plan Note (Signed)
She stopped taking her metformin.  She is trying to manage via diet alone.  We will check A1c today.  Follow-up in 3 to 6 months.

## 2022-02-21 NOTE — Progress Notes (Signed)
   Selena Holt is a 51 y.o. female who presents today for an office visit.  Assessment/Plan:  New/Acute Problems: Rectal Bleeding Likely due to internal hemorrhoids.  No red flags.  She is overdue for colonoscopy.  We will place referral to GI.  Check labs today.  Lower Abdominal Pain Reassuring exam.  She was seen here about a week ago for this by different provider and started on empiric treatment for BV.  Symptoms seem to be improving.  We will check urine culture and UA to rule out UTI and other possible causes.  It is reassuring that symptoms seem to be improving.  Would consider CT scan if symptoms do not continue to improve over the next 1 to 2 weeks.  We discussed reasons to return to care.  Chronic Problems Addressed Today: T2DM (type 2 diabetes mellitus) ([place]) She stopped taking her metformin.  She is trying to manage via diet alone.  We will check A1c today.  Follow-up in 3 to 6 months.  Vitamin B 12 deficiency Check B12.  Vitamin D deficiency Check vitamin D.  Dyslipidemia associated with type 2 diabetes mellitus (HCC) Check lipids.  Hypertension associated with diabetes ([place]) At goal today off medications.  Hepatocellular dysfunction Has followed with liver clinic last year following an episode of elevated LFTs.  She had work-up there that was reassuring.  Possibly due to COVID infection last year.  She will follow-up with them again in a couple of months.  We will check LFTs today on her c-Met.     Subjective:  HPI:  See A/p for status of chronic conditions.  Patient here with rectal bleeding. She thinks that she may have a hemorrhoid. This has been going on for several days. No pain. She has never had bleeding issues before. She has had hemorrhoids in the past which have never been much of an issue.   She is also having some issues with lower abdominal pain. This has been going on for about a month. Come chills. No fevers.        Objective:  Physical  Exam: BP 130/81   Pulse 80   Temp 98.1 F (36.7 C) (Temporal)   Ht _0  (1.626 m)   Wt 135 lb 12.8 oz (61.6 kg)   LMP 11/07/2021   SpO2 100%   BMI 23.31 kg/m   Gen: No acute distress, resting comfortably CV: Regular rate and rhythm with no murmurs appreciated Pulm: Normal work of breathing, clear to auscultation bilaterally with no crackles, wheezes, or rhonchi GI: S, NT, ND Neuro: Grossly normal, moves all extremities Psych: Normal affect and thought content      Karime Scheuermann M. Jerline Pain, MD 02/21/2022 8:30 AM

## 2022-02-22 LAB — URINE CULTURE
MICRO NUMBER:: 13737505
Result:: NO GROWTH
SPECIMEN QUALITY:: ADEQUATE

## 2022-02-24 NOTE — Progress Notes (Signed)
Please inform patient of the following:  Labs are all stable.  Do not need to make any changes to her treatment plan at this time.  She should continue to work on diet and exercise.  I would like to see her back in 6 months to recheck her A1c.

## 2022-03-18 ENCOUNTER — Encounter: Payer: Self-pay | Admitting: Family Medicine

## 2022-03-18 ENCOUNTER — Ambulatory Visit: Payer: Federal, State, Local not specified - PPO | Admitting: Family Medicine

## 2022-03-18 VITALS — BP 110/80 | HR 84 | Temp 97.9°F | Ht 64.0 in | Wt 134.4 lb

## 2022-03-18 DIAGNOSIS — L03031 Cellulitis of right toe: Secondary | ICD-10-CM

## 2022-03-18 MED ORDER — CLINDAMYCIN HCL 300 MG PO CAPS: 300.0000 mg | ORAL_CAPSULE | Freq: Two times a day (BID) | ORAL | 0 refills | Status: DC

## 2022-03-18 NOTE — Patient Instructions (Signed)
Please follow up if symptoms do not improve or as needed.    Please take the clindamycin twice a day for a week.

## 2022-03-18 NOTE — Progress Notes (Signed)
Subjective  CC:  Chief Complaint  Patient presents with   Toe Injury    Pt has a cut on her Rt toe.    Same day acute visit; PCP not available. New pt to me. Chart reviewed.   HPI: Selena Holt is a 51 y.o. female who presents to the office today to address the problems listed above in the chief complaint. 51 year old type II diabetic, good control presents due to pain and redness in her right great toe.  Had pedicure last week.  Noted some redness and bleeding afterwards.  Now, has a red painful area at base of nail bed with pain radiating medially, mild swelling.  No drainage.  No fevers or chills.  Assessment  1. Paronychia of great toe of right foot      Plan  Infection paronychia great toe due to abrasion and diabetic: Cover for MRSA with clindamycin.  Patient has multiple drug allergies.  Warm salt water soaks.  Advil as needed.  Follow-up if not proving in 48 to 72 hours  Follow up: As needed 09/01/2022  No orders of the defined types were placed in this encounter.  Meds ordered this encounter  Medications   clindamycin (CLEOCIN) 300 MG capsule    Sig: Take 1 capsule (300 mg total) by mouth in the morning and at bedtime.    Dispense:  14 capsule    Refill:  0      I reviewed the patients updated PMH, FH, and SocHx.    Patient Active Problem List   Diagnosis Date Noted   Hypertension associated with diabetes ([place]) 03/01/2020   Back pain 05/13/2019   Stress 01/14/2018   Hepatocellular dysfunction 01/08/2018   Anemia, iron deficiency 01/03/2015   Vitamin B 12 deficiency 03/09/2014   Dyslipidemia associated with type 2 diabetes mellitus ([place]) 02/10/2014   Vitamin D deficiency 02/10/2014   T2DM (type 2 diabetes mellitus) ([place]) 11/11/2012   Current Meds  Medication Sig   azelastine (ASTELIN) 0.1 % nasal spray Place 2 sprays into both nostrils 2 (two) times daily.   blood glucose meter kit and supplies KIT Dispense based on patient and insurance preference. Use  up to four times daily as directed. (FOR ICD-9 250.00, 250.01).   clindamycin (CLEOCIN) 300 MG capsule Take 1 capsule (300 mg total) by mouth in the morning and at bedtime.   Cyanocobalamin (VITAMIN B-12 PO) Take 1,200 mcg by mouth. Pt only taking twice a week   glucose blood (ONETOUCH VERIO) test strip Use as instructed to check blood sugar up to 3 times daily   ibuprofen (ADVIL) 200 MG tablet Take 200 mg by mouth every 6 (six) hours as needed.   Magnesium 250 MG TABS Take by mouth.   metFORMIN (GLUCOPHAGE) 500 MG tablet TAKE 1 TABLET (500 MG TOTAL) BY MOUTH 2 (TWO) TIMES DAILY WITH A MEAL.   OneTouch Delica Lancets 64P MISC Use daily as needed to check blood sugar.    Allergies: Patient is allergic to tetanus toxoids, ceclor [cefaclor], doxycycline, penicillins, sulfa antibiotics, and lisinopril. Family History: Patient family history includes Alzheimer's disease (age of onset: 71) in her maternal grandmother; CAD in her father; Heart disease in her father; Hyperlipidemia in her father and mother; Hypertension in her father and mother; Other in her brother. Social History:  Patient  reports that she has never smoked. She has never used smokeless tobacco. She reports that she does not drink alcohol and does not use drugs.  Review of Systems:  Constitutional: Negative for fever malaise or anorexia Cardiovascular: negative for chest pain Respiratory: negative for SOB or persistent cough Gastrointestinal: negative for abdominal pain  Objective  Vitals: BP 110/80   Pulse 84   Temp 97.9 F (36.6 C)   Ht 5' 4"  (1.626 m)   Wt 134 lb 6.4 oz (61 kg)   LMP 11/07/2021   SpO2 99%   BMI 23.07 kg/m  General: no acute distress , A&Ox3 Right great toe: Base of nail bed there is a small red abrasion with some crusting, tender medial paronychia without redness or swelling or fluctuance.    Commons side effects, risks, benefits, and alternatives for medications and treatment plan prescribed today  were discussed, and the patient expressed understanding of the given instructions. Patient is instructed to call or message via MyChart if he/she has any questions or concerns regarding our treatment plan. No barriers to understanding were identified. We discussed Red Flag symptoms and signs in detail. Patient expressed understanding regarding what to do in case of urgent or emergency type symptoms.  Medication list was reconciled, printed and provided to the patient in AVS. Patient instructions and summary information was reviewed with the patient as documented in the AVS. This note was prepared with assistance of Dragon voice recognition software. Occasional wrong-word or sound-a-like substitutions may have occurred due to the inherent limitations of voice recognition software  This visit occurred during the SARS-CoV-2 public health emergency.  Safety protocols were in place, including screening questions prior to the visit, additional usage of staff PPE, and extensive cleaning of exam room while observing appropriate contact time as indicated for disinfecting solutions.

## 2022-03-25 ENCOUNTER — Encounter: Payer: Self-pay | Admitting: Gastroenterology

## 2022-03-25 ENCOUNTER — Ambulatory Visit: Payer: Federal, State, Local not specified - PPO | Admitting: Gastroenterology

## 2022-03-25 VITALS — BP 108/68 | HR 95 | Ht 64.0 in | Wt 132.4 lb

## 2022-03-25 DIAGNOSIS — K648 Other hemorrhoids: Secondary | ICD-10-CM | POA: Diagnosis not present

## 2022-03-25 DIAGNOSIS — K5909 Other constipation: Secondary | ICD-10-CM

## 2022-03-25 NOTE — Patient Instructions (Signed)
_______________________________________________________  If you are age 49 or older, your body mass index should be between 23-30. Your Body mass index is 22.72 kg/m. If this is out of the aforementioned range listed, please consider follow up with your Primary Care Provider.  If you are age 21 or younger, your body mass index should be between 19-25. Your Body mass index is 22.72 kg/m. If this is out of the aformentioned range listed, please consider follow up with your Primary Care Provider.   ________________________________________________________  The [facility] providers would like to encourage you to use [facility] to communicate with providers for non-urgent requests or questions.  Due to long hold times on the telephone, sending your provider a message by [facility] may be a faster and more efficient way to get a response.  Please allow 48 business hours for a response.  Please remember that this is for non-urgent requests.  _______________________________________________________  It has been recommended to you by your physician that you have a(n) colonoscopy completed. Per your request, we did not schedule the procedure(s) today. Please contact our office at [phone] should you decide to have the procedure completed. You will be scheduled for a pre-visit and procedure at that time.  It was a pleasure to see you today!  Thank you for trusting me with your gastrointestinal care!

## 2022-03-25 NOTE — Progress Notes (Signed)
[facility] Consult Note:  History: Selena Holt 03/25/2022  Referring provider: Vivi Barrack, MD  Reason for consult/chief complaint: Rectal Bleeding (BRBPR when having a BM.  Started on Aug 1.  This only lasted for a few days; stopped on 8-7. Patient has not noticed any blood since.)   Subjective  HPI: Selena Holt was referred by primary care for recent episode of rectal bleeding. She was scheduled to have a screening colonoscopy with me in June 2022, but called to cancel the day of procedure since she vomited with the evening dose of bowel preparation.  ([place]) She had years of chronic constipation improved somewhat after starting a gluten-free diet sometime last year.  Since then she has a BM about every day to every other day.  However, sometimes she will have feeling of a large stool that is unable to pass, and when she has looked at the perianal area with a mirror during those episodes, it seems the area is swollen. A month ago she had an episode of bright red blood per rectum with a clot and some subsequent bleeding that lasted several days with no further bleeding since then.  She denies chronic abdominal pain, nausea, vomiting or weight loss.Selena Holt is apprehensive about the sedation for colonoscopy, and we had a further discussion about the process of colonoscopy bowel preparation and sedation.   ROS:  Review of Systems  Constitutional:  Negative for appetite change and unexpected weight change.  HENT:  Negative for mouth sores and voice change.   Eyes:  Negative for pain and redness.  Respiratory:  Negative for cough and shortness of breath.   Cardiovascular:  Negative for chest pain and palpitations.  Genitourinary:  Negative for dysuria and hematuria.  Musculoskeletal:  Negative for arthralgias and myalgias.  Skin:  Negative for pallor and rash.  Neurological:  Negative for weakness and headaches.  Hematological:  Negative for adenopathy.      Past Medical History: Past Medical History:  Diagnosis Date   Allergy    Anemia    Anxiety    Bipolar disorder ([place])    Collapsed lung    "spontaneous" , teenager   Constipation    Depression    Diabetes mellitus without complication (HCC)    Glaucoma    Heart murmur    HSV-1 (herpes simplex virus 1) infection    Hypertension    Kidney stones    Meningitis    Scoliosis (and kyphoscoliosis), idiopathic      Past Surgical History: Past Surgical History:  Procedure Laterality Date   CHEST TUBE INSERTION     TYMPANOSTOMY TUBE PLACEMENT       Family History: Family History  Problem Relation Age of Onset   Hyperlipidemia Mother    Hypertension Mother    Hyperlipidemia Father    Hypertension Father    CAD Father    Heart disease Father    Other Brother        colostomy    Alzheimer's disease Maternal Grandmother 83   Colon cancer Neg Hx    Colon polyps Neg Hx    Esophageal cancer Neg Hx    Rectal cancer Neg Hx    Stomach cancer Neg Hx    Pancreatic cancer Neg Hx     Social History: Social History   Socioeconomic History   Marital status: Single    Spouse name: Not on file   Number of children: 2   Years of education: Not on file  Highest education level: Not on file  Occupational History   Occupation: clerical    Employer: [employer]  Tobacco Use   Smoking status: Never   Smokeless tobacco: Never  Vaping Use   Vaping Use: Never used  Substance and Sexual Activity   Alcohol use: No    Alcohol/week: 0.0 standard drinks of alcohol   Drug use: No   Sexual activity: Never  Other Topics Concern   Not on file  Social History Narrative   She lives with her grown son. 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   Social Determinants of Health   Financial Resource Strain: Not on file  Food Insecurity: Not on file  Transportation Needs: Not on file  Physical Activity: Not on file  Stress: Not on file  Social  Connections: Not on file    Allergies: Allergies  Allergen Reactions   Tetanus Toxoids Anaphylaxis    tetanus   Ceclor [Cefaclor] Hives   Doxycycline Other (See Comments)    Hives   Penicillins Hives   Sulfa Antibiotics Hives   Lisinopril Other (See Comments)    fatigue    Outpatient Meds: Current Outpatient Medications  Medication Sig Dispense Refill   azelastine (ASTELIN) 0.1 % nasal spray Place 2 sprays into both nostrils 2 (two) times daily. 30 mL 12   blood glucose meter kit and supplies KIT Dispense based on patient and insurance preference. Use up to four times daily as directed. (FOR ICD-9 250.00, 250.01). 1 each 0   glucose blood (ONETOUCH VERIO) test strip Use as instructed to check blood sugar up to 3 times daily 300 each 0   ibuprofen (ADVIL) 200 MG tablet Take 200 mg by mouth every 6 (six) hours as needed.     Magnesium 250 MG TABS Take 1 tablet by mouth daily at 2 PM.     OneTouch Delica Lancets 20N MISC Use daily as needed to check blood sugar. 100 each 2   Cyanocobalamin (VITAMIN B-12 PO) Take 1,200 mcg by mouth. Pt only taking twice a week (Patient not taking: Reported on 03/25/2022)     metFORMIN (GLUCOPHAGE) 500 MG tablet TAKE 1 TABLET (500 MG TOTAL) BY MOUTH 2 (TWO) TIMES DAILY WITH A MEAL. (Patient not taking: Reported on 03/25/2022) 180 tablet 3   No current facility-administered medications for this visit.      ___________________________________________________________________ Objective   Exam:  BP 108/68   Pulse 95   Ht 5' 4"  (1.626 m)   Wt 132 lb 6 oz (60 kg)   BMI 22.72 kg/m  Wt Readings from Last 3 Encounters:  03/25/22 132 lb 6 oz (60 kg)  03/18/22 134 lb 6.4 oz (61 kg)  02/21/22 135 lb 12.8 oz (61.6 kg)  CMA present for exam  General: Thin, well-appearing Eyes: sclera anicteric, no redness ENT: oral mucosa moist without lesions, no cervical or supraclavicular lymphadenopathy CV: Soft systolic murmur, no JVD, no peripheral edema Resp:  clear to auscultation bilaterally, normal RR and effort noted GI: soft, no tenderness, with active bowel sounds. No guarding or palpable organomegaly noted. Skin; warm and dry, no rash or jaundice noted Neuro: awake, alert and oriented x 3. Normal gross motor function and fluent speech . Exam normal.  DRE normal without tenderness, fissure, palpable internal lesion or tenderness.  Scant firm stool in the rectal vault Anoscopy revealed internal hemorrhoids, primarily right posterior and anterio/lateral (from 7 to 9 o'clock position) Labs:     Latest  Ref Rng & Units 02/21/2022    8:32 AM 03/08/2021    3:07 PM 12/29/2020    9:37 PM  CBC  WBC 4.0 - 10.5 K/uL 7.9  5.9  12.5   Hemoglobin 12.0 - 15.0 g/dL 12.8  12.8  13.0   Hematocrit 36.0 - 46.0 % 36.7  38.4  37.3   Platelets 150.0 - 400.0 K/uL 257.0  212  321       Latest Ref Rng & Units 02/21/2022    8:32 AM 04/15/2021    3:38 PM 04/01/2021    4:08 PM  CMP  Glucose 70 - 99 mg/dL 130  131  120   BUN 6 - 23 mg/dL 12  15  15    Creatinine 0.40 - 1.20 mg/dL 0.71  0.64  0.68   Sodium 135 - 145 mEq/L 136  135  134   Potassium 3.5 - 5.1 mEq/L 5.0  3.5  3.7   Chloride 96 - 112 mEq/L 101  99  95   CO2 19 - 32 mEq/L 27  26  26    Calcium 8.4 - 10.5 mg/dL 9.6  9.9  10.6   Total Protein 6.0 - 8.3 g/dL 7.0  7.2  7.6   Total Bilirubin 0.2 - 1.2 mg/dL 0.7  0.5  0.7   Alkaline Phos 39 - 117 U/L 84  151  163   AST 0 - 37 U/L 27  76  39   ALT 0 - 35 U/L 42  189  106    PCP note indicates elevated LFTs in 2022 were though to be COVID-related  Radiologic Studies:  CLINICAL DATA:  Elevated LFTs.   EXAM: ULTRASOUND ABDOMEN LIMITED RIGHT UPPER QUADRANT   COMPARISON:  CT abdomen and pelvis 12/30/2020.   FINDINGS: Gallbladder:   No gallstones or wall thickening visualized. No sonographic Murphy sign noted by sonographer.   Common bile duct:   Diameter: 3.2 mm.   Liver:   No focal lesion identified. There is coarse liver echotexture. Normal  parenchymal echogenicity. Portal vein is patent on color Doppler imaging with normal direction of blood flow towards the liver.   Other: None.   IMPRESSION: 1. Coarse liver echotexture may be related to diffuse hepatocellular disease. Recommend clinical correlation and follow-up. 2. No cholelithiasis.     Electronically Signed   By: Ronney Asters M.D.   On: 04/09/2021 21:41   Assessment: Encounter Diagnoses  Name Primary?   Bleeding internal hemorrhoids Yes   Chronic constipation     Recent episode of rectal bleeding that appears to have been internal hemorrhoidal in nature.  Less likely a more proximal rectal or colonic source.  Chronic constipation, reportedly better on gluten-free diet but still present.  Recommended as needed use of glycerin suppository when she has an episode as described above, followed by one half to a full capful of MiraLAX glass of water that day or the next day.     Plan:  I also recommended she seriously consider screening colonoscopy.  Thorough discussion of procedure and risks as follows:  The benefits and risks of the planned procedure were described in detail with the patient or (when appropriate) their health care proxy.  Risks were outlined as including, but not limited to, bleeding, infection, perforation, adverse medication reaction leading to cardiac or pulmonary decompensation, pancreatitis (if ERCP).  The limitation of incomplete mucosal visualization was also discussed.  No guarantees or warranties were given.  Constipation is managed effectively but she has persistent rectal  bleeding, and a diagnostic colonoscopy is warranted.  Possibilities of treatment with hemorrhoidal suppositories or banding were reviewed.  Selena Holt seems to be leaning toward doing the colonoscopy but still somewhat anxious about it.  She wanted to give it further consideration and was therefore encouraged to contact us if she wishes to proceed.  Thank you for the  courtesy of this consult.  Please call me with any questions or concerns.  Nelida Meuse III  CC: Referring provider noted above

## 2022-04-07 ENCOUNTER — Encounter: Payer: Self-pay | Admitting: Family Medicine

## 2022-04-09 NOTE — Telephone Encounter (Signed)
We saw her last month. Do not need appointment at this time.  Algis Greenhouse. Jerline Pain, MD 04/09/2022 9:25 AM

## 2022-04-11 ENCOUNTER — Encounter: Payer: Self-pay | Admitting: Gastroenterology

## 2022-04-11 NOTE — Telephone Encounter (Signed)
[place] with me.  Algis Greenhouse. Jerline Pain, MD 04/11/2022 1:25 PM

## 2022-04-14 ENCOUNTER — Ambulatory Visit: Payer: Federal, State, Local not specified - PPO | Admitting: Family Medicine

## 2022-04-14 ENCOUNTER — Telehealth: Payer: Self-pay | Admitting: Gastroenterology

## 2022-04-14 NOTE — Telephone Encounter (Signed)
Returned call to patient. She states that she feels a "tugging on the inside of her belly button". Pt describes the pain as intermittent and can vary from sharp pain to dull and achy. Pt states the pain is random and she has not noticed a pattern. She reports that she has been more constipated lately and rectal bleeding has returned. Her last BM was yesterday, stool was hard and only passed a small amount. I informed patient that she could be having abdominal spasms. I asked patient if she was taking Miralax and Glycerin suppositories as recommended, and she has not been. I advised pt to begin Miralax up to TID PRN, pt knows that she can titrate doses up or down. Pt has been advised to purchase Glycerin suppositories OTC as well and to use PRN when she feels like she has to have a BM but can't. Pt has been advised that the bleeding is likely due to irritation of her internal hemorrhoids. Pt has been advised that Dr. Loletha Carrow will be able to see more at the time of her colonoscopy. Pt verbalized understanding and had no concerns at the end of the call.

## 2022-04-14 NOTE — Telephone Encounter (Signed)
Patient called, states she is having abdominal pain, states it feels like something is pulling inside her stomach also states se is still having blood in stool. Requesting a call back as soon as possible

## 2022-04-14 NOTE — Telephone Encounter (Signed)
Patient notified FMLA ready to be pick up

## 2022-04-22 DIAGNOSIS — H40053 Ocular hypertension, bilateral: Secondary | ICD-10-CM | POA: Diagnosis not present

## 2022-04-22 DIAGNOSIS — H40013 Open angle with borderline findings, low risk, bilateral: Secondary | ICD-10-CM | POA: Diagnosis not present

## 2022-04-25 ENCOUNTER — Encounter: Payer: Self-pay | Admitting: Family Medicine

## 2022-04-25 ENCOUNTER — Ambulatory Visit: Payer: Federal, State, Local not specified - PPO | Admitting: Family Medicine

## 2022-04-25 VITALS — BP 115/76 | HR 81 | Temp 97.3°F | Ht 64.0 in | Wt 131.8 lb

## 2022-04-25 DIAGNOSIS — E1159 Type 2 diabetes mellitus with other circulatory complications: Secondary | ICD-10-CM

## 2022-04-25 DIAGNOSIS — I152 Hypertension secondary to endocrine disorders: Secondary | ICD-10-CM | POA: Diagnosis not present

## 2022-04-25 DIAGNOSIS — R21 Rash and other nonspecific skin eruption: Secondary | ICD-10-CM

## 2022-04-25 DIAGNOSIS — E119 Type 2 diabetes mellitus without complications: Secondary | ICD-10-CM

## 2022-04-25 LAB — CBC WITH DIFFERENTIAL/PLATELET
Basophils Absolute: 0 10*3/uL (ref 0.0–0.1)
Basophils Relative: 0.3 % (ref 0.0–3.0)
Eosinophils Absolute: 0.1 10*3/uL (ref 0.0–0.7)
Eosinophils Relative: 1 % (ref 0.0–5.0)
HCT: 37.4 % (ref 36.0–46.0)
Hemoglobin: 13.1 g/dL (ref 12.0–15.0)
Lymphocytes Relative: 24.4 % (ref 12.0–46.0)
Lymphs Abs: 1.9 10*3/uL (ref 0.7–4.0)
MCHC: 35 g/dL (ref 30.0–36.0)
MCV: 90.9 fl (ref 78.0–100.0)
Monocytes Absolute: 0.4 10*3/uL (ref 0.1–1.0)
Monocytes Relative: 5.7 % (ref 3.0–12.0)
Neutro Abs: 5.3 10*3/uL (ref 1.4–7.7)
Neutrophils Relative %: 68.6 % (ref 43.0–77.0)
Platelets: 247 10*3/uL (ref 150.0–400.0)
RBC: 4.11 Mil/uL (ref 3.87–5.11)
RDW: 13.1 % (ref 11.5–15.5)
WBC: 7.7 10*3/uL (ref 4.0–10.5)

## 2022-04-25 LAB — COMPREHENSIVE METABOLIC PANEL
ALT: 92 U/L — ABNORMAL HIGH (ref 0–35)
AST: 45 U/L — ABNORMAL HIGH (ref 0–37)
Albumin: 4.9 g/dL (ref 3.5–5.2)
Alkaline Phosphatase: 127 U/L — ABNORMAL HIGH (ref 39–117)
BUN: 11 mg/dL (ref 6–23)
CO2: 27 mEq/L (ref 19–32)
Calcium: 9.9 mg/dL (ref 8.4–10.5)
Chloride: 103 mEq/L (ref 96–112)
Creatinine, Ser: 0.75 mg/dL (ref 0.40–1.20)
GFR: 93.24 mL/min (ref >60.00)
Glucose, Bld: 143 mg/dL — ABNORMAL HIGH (ref 70–99)
Potassium: 4.2 mEq/L (ref 3.5–5.1)
Sodium: 139 mEq/L (ref 135–145)
Total Bilirubin: 0.6 mg/dL (ref 0.2–1.2)
Total Protein: 7.7 g/dL (ref 6.0–8.3)

## 2022-04-25 LAB — TSH: TSH: 2.98 u[IU]/mL (ref 0.35–5.50)

## 2022-04-25 LAB — HIGH SENSITIVITY CRP: CRP, High Sensitivity: 5.94 mg/L — ABNORMAL HIGH (ref 0.000–5.000)

## 2022-04-25 LAB — SEDIMENTATION RATE: Sed Rate: 12 mm/hr (ref 0–20)

## 2022-04-25 MED ORDER — PREDNISONE 10 MG PO TABS: 10.0000 mg | ORAL_TABLET | Freq: Every day | ORAL | 0 refills | Status: DC

## 2022-04-25 MED ORDER — VALACYCLOVIR HCL 1 G PO TABS: 1000.0000 mg | ORAL_TABLET | Freq: Three times a day (TID) | ORAL | 0 refills | Status: AC

## 2022-04-25 NOTE — Patient Instructions (Signed)
It was very nice to see you today!  I am concerned you may have disseminated shingles.  Please start the prednisone and Valtrex.  We are checking blood work and a pcr test confirm this.  We will need to send you to an allergist depending on the results.  Please let me know how things are going in the next 1 to 2 weeks.  Take care, Dr Jerline Pain  PLEASE NOTE:  If you had any lab tests please let us know if you have not heard back within a few days. You may see your results on mychart before we have a chance to review them but we will give you a call once they are reviewed by [place]. If we ordered any referrals today, please let us know if you have not heard from their office within the next week.   Please try these tips to maintain a healthy lifestyle:  Eat at least 3 REAL meals and 1-2 snacks per day.  Aim for no more than 5 hours between eating.  If you eat breakfast, please do so within one hour of getting up.   Each meal should contain half fruits/vegetables, one quarter protein, and one quarter carbs (no bigger than a computer mouse)  Cut down on sweet beverages. This includes juice, soda, and sweet tea.   Drink at least 1 glass of water with each meal and aim for at least 8 glasses per day  Exercise at least 150 minutes every week.

## 2022-04-25 NOTE — Assessment & Plan Note (Signed)
At goal today off medications. 

## 2022-04-25 NOTE — Assessment & Plan Note (Signed)
Last A1c 6.1.  Too early to recheck today.  We can recheck again in 3 to 6 months.

## 2022-04-25 NOTE — Progress Notes (Signed)
   Selena Holt is a 51 y.o. female who presents today for an office visit.  Assessment/Plan:  New/Acute Problems: Rash Unclear etiology though history and presentation concerning for possible disseminated shingles.  She did have a complex case of shingles 6 years ago resulting in zoster meningitis.  We will check labs today.  We will also check a PCR swab to confirm for zoster.  We will empirically treat with prednisone and Valtrex especially in light of her complicated history.  It is possible this could represent a contact dermatitis or allergy though location is atypical for this.  If labs including PCR and serology are negative would consider referral to allergy for testing.  Chronic Problems Addressed Today: Hypertension associated with diabetes ([place]) At goal today off medications.  T2DM (type 2 diabetes mellitus) (HCC) Last A1c 6.1.  Too early to recheck today.  We can recheck again in 3 to 6 months.     Subjective:  HPI:  Patient here with rash. A couple of weeks ago she started having malaise and subjective fevers. Ended up having some night sweats as well. Some sore throat and nasal drainage. This all mostly started resolving but a couple of days she had a sudden onset of pain and itching on her inner thighs. She went out into the sun and the itching stopped but still had some pain to the area. She this started noticing some rash on the back of her wrists with intense stinging.  She noticed clear fluid coming out of the lesion on the back of her wrist. No bleeding. She has pain everywhere.  No new exposures. No new soaps or detergents.         Objective:  Physical Exam: BP 115/76   Pulse 81   Temp (!) 97.3 F (36.3 C) (Temporal)   Ht 5\' 4"  (1.626 m)   Wt 131 lb 12.8 oz (59.8 kg)   SpO2 100%   BMI 22.62 kg/m   Gen: No acute distress, resting comfortably CV: Regular rate and rhythm with no murmurs appreciated Pulm: Normal work of breathing, clear to auscultation  bilaterally with no crackles, wheezes, or rhonchi SKIN: Numerous small scattered 1-33mm erythematous lesions scattered across inner thighs and dorsal aspects of upper extremities bilaterally.  A few vesicular lesions noted. Neuro: Grossly normal, moves all extremities Psych: Normal affect and thought content      Phinehas Grounds M. Jerline Pain, MD 04/25/2022 8:03 AM

## 2022-04-27 LAB — VARICELLA-ZOSTER BY PCR: VARICELLA ZOSTER VIRUS (VZV) DNA, QL RT PCR: NOT DETECTED

## 2022-04-28 ENCOUNTER — Ambulatory Visit (AMBULATORY_SURGERY_CENTER): Payer: Self-pay

## 2022-04-28 ENCOUNTER — Encounter: Payer: Self-pay | Admitting: Family Medicine

## 2022-04-28 VITALS — Ht 64.0 in | Wt 132.0 lb

## 2022-04-28 DIAGNOSIS — K625 Hemorrhage of anus and rectum: Secondary | ICD-10-CM

## 2022-04-28 DIAGNOSIS — F4310 Post-traumatic stress disorder, unspecified: Secondary | ICD-10-CM | POA: Diagnosis not present

## 2022-04-28 MED ORDER — ONDANSETRON HCL 4 MG PO TABS: 4.0000 mg | ORAL_TABLET | ORAL | 0 refills | Status: DC

## 2022-04-28 MED ORDER — NA SULFATE-K SULFATE-MG SULF 17.5-3.13-1.6 GM/177ML PO SOLN: 1.0000 | Freq: Once | ORAL | 0 refills | Status: AC

## 2022-04-28 NOTE — Telephone Encounter (Signed)
See note

## 2022-04-28 NOTE — Progress Notes (Signed)
No egg allergy known to patient  Patient reports that she was told not to eat soy as she is allergic to it-she has been eating a gluten free diet for almost a year now and has not eaten anything with soy- and is without issues;  No issues known to pt with past sedation with any surgeries or procedures Patient denies ever being told they had issues or difficulty with intubation  No FH of Malignant Hyperthermia Pt is not on diet pills Pt is not on home 02  Pt is not on blood thinners  Pt reports issues with constipation- patient reports she is eating gluten free, taking a stool softener as needed, and taking  No A fib or A flutter Have any cardiac testing pending--NO Pt instructed to use [email or web address] or GoodRx for a price reduction on prep - patient given [employer] card during Pre Visit appt;

## 2022-04-28 NOTE — Telephone Encounter (Signed)
We are still waiting on one of her blood tests to come back but it looks it is probably not shingles.   She should stop the valtrex. Please make sure that she is taking the prednisone.  We can send in hydroxyzine 50mg  tid prn itching. Would like for her to follow up here if the rash is not improving.

## 2022-04-29 LAB — VARICELLA ZOSTER ANTIBODY, IGM: Varicella Zoster Ab IgM: <=0.9 (ref <=0.90)

## 2022-04-29 LAB — VARICELLA ZOSTER ANTIBODY, IGG: Varicella IgG: 883.2 index

## 2022-04-30 ENCOUNTER — Telehealth: Payer: Self-pay | Admitting: Family Medicine

## 2022-04-30 ENCOUNTER — Other Ambulatory Visit: Payer: Self-pay

## 2022-04-30 MED ORDER — HYDROXYZINE PAMOATE 50 MG PO CAPS: 50.0000 mg | ORAL_CAPSULE | Freq: Three times a day (TID) | ORAL | 0 refills | Status: DC | PRN

## 2022-04-30 NOTE — Progress Notes (Signed)
Please inform patient of the following:  Her test for shingles are negative.  Her inflammatory marker was slightly elevated and her liver numbers were slightly elevated but everything else was normal.  We do not need to do anything else at this time.  She should let us know if her rash and itching is not improving.

## 2022-04-30 NOTE — Telephone Encounter (Signed)
Patient returned call to go over lab results. Patient requests to be called at ph# [phone]

## 2022-05-01 ENCOUNTER — Ambulatory Visit: Payer: Federal, State, Local not specified - PPO | Admitting: Family Medicine

## 2022-05-01 ENCOUNTER — Encounter: Payer: Self-pay | Admitting: Family Medicine

## 2022-05-01 VITALS — BP 147/81 | HR 86 | Temp 97.5°F | Ht 64.0 in | Wt 134.8 lb

## 2022-05-01 DIAGNOSIS — L309 Dermatitis, unspecified: Secondary | ICD-10-CM | POA: Diagnosis not present

## 2022-05-01 DIAGNOSIS — I152 Hypertension secondary to endocrine disorders: Secondary | ICD-10-CM

## 2022-05-01 DIAGNOSIS — E1159 Type 2 diabetes mellitus with other circulatory complications: Secondary | ICD-10-CM

## 2022-05-01 DIAGNOSIS — R21 Rash and other nonspecific skin eruption: Secondary | ICD-10-CM

## 2022-05-01 DIAGNOSIS — E119 Type 2 diabetes mellitus without complications: Secondary | ICD-10-CM | POA: Diagnosis not present

## 2022-05-01 MED ORDER — HYDROXYZINE HCL 50 MG PO TABS: 50.0000 mg | ORAL_TABLET | Freq: Three times a day (TID) | ORAL | 0 refills | Status: DC | PRN

## 2022-05-01 MED ORDER — TRIAMCINOLONE ACETONIDE 0.5 % EX OINT: 1.0000 | TOPICAL_OINTMENT | Freq: Two times a day (BID) | CUTANEOUS | 0 refills | Status: DC

## 2022-05-01 NOTE — Assessment & Plan Note (Signed)
Elevated as though has typically been well controlled.  She will continue to monitor at home and let us know if persistently elevated.

## 2022-05-01 NOTE — Assessment & Plan Note (Signed)
We can recheck A1c next office visit.  Last A1c 6.1 on metformin 500 mg twice daily.

## 2022-05-01 NOTE — Progress Notes (Signed)
   Selena Holt is a 51 y.o. female who presents today for an office visit.  Assessment/Plan:  New/Acute Problems: Rash Unclear etiology.  Do not have much response to prednisone.  Could be allergic dermatitis.  Arthropod bites are also a consideration though less likely given that she is not had any close contacts with similar appearing bites.  May be neurodermatitis as well.  Punch biopsy performed today.  She tolerated well.  See below procedure note.  We will also start hydroxyzine 50 mg 3 times daily.  Start triamcinolone.  Pending on results of punch biopsy may need referral to dermatology versus allergy.  Chronic Problems Addressed Today: T2DM (type 2 diabetes mellitus) (Selena Holt) We can recheck A1c next office visit.  Last A1c 6.1 on metformin 500 mg twice daily.  Hypertension associated with diabetes (Selena Holt) Elevated as though has typically been well controlled.  She will continue to monitor at home and let us know if persistently elevated.     Subjective:  HPI:  Patient here for rash follow-up.  We saw her 6 days ago for this.  There was some concern for disseminated shingles.  We started her empirically on prednisone and Valtrex.  Symptoms have not improved.  She did have lab work done a week ago which showed mildly elevated CRP however everything else was negative including varicella PCR swab.  She has had some headache and some slight fever. Main concern is significant pruritus.  No new obvious exposures.  There has been some concern for "bugs" at work but she is not sure what kind of bugs.  No else at work has had any issues with rash.  She lives alone.  She has not noticed any black infestations at home either..        Objective:  Physical Exam: BP (!) 144/82   Pulse 86   Temp (!) 97.5 F (36.4 C) (Temporal)   Ht 5\' 4"  (1.626 m)   Wt 134 lb 12.8 oz (61.1 kg)   SpO2 100%   BMI 23.14 kg/m   Gen: No acute distress, resting comfortably CV: Regular rate and rhythm with no  murmurs appreciated Pulm: Normal work of breathing, clear to auscultation bilaterally with no crackles, wheezes, or rhonchi Skin: Widely distributed rash consisting of small 2 to 3 mm punctate erythematous papules distributed across the inner thighs, trunk, and upper extremities. Neuro: Grossly normal, moves all extremities Psych: Normal affect and thought content  Procedure Note:  After informed Verbal consent was obtained, using Betadine for cleansing and 1% Lidocaine with epinephrine for anesthetic, with sterile technique a 3 mm punch biopsy was used to obtain a biopsy specimen of the lesion. Hemostasis was obtained by pressure and wound was not sutured. Antibiotic dressing is applied, and wound care instructions provided. Be alert for any signs of cutaneous infection. The specimen is labeled and sent to pathology for evaluation. The procedure was well tolerated without complications.       Algis Greenhouse. Jerline Pain, MD 05/01/2022 8:59 AM

## 2022-05-01 NOTE — Telephone Encounter (Signed)
Patient in office today, lab results given to patient

## 2022-05-01 NOTE — Patient Instructions (Signed)
It was very nice to see you today!  We biopsied your rash today.  Please start the hydroxyzine and triamcinolone.  He can also use Claritin or Zyrtec as needed to help with itching.  Take care, Dr Jerline Pain  PLEASE NOTE:  If you had any lab tests please let us know if you have not heard back within a few days. You may see your results on mychart before we have a chance to review them but we will give you a call once they are reviewed by [place]. If we ordered any referrals today, please let us know if you have not heard from their office within the next week.   Please try these tips to maintain a healthy lifestyle:  Eat at least 3 REAL meals and 1-2 snacks per day.  Aim for no more than 5 hours between eating.  If you eat breakfast, please do so within one hour of getting up.   Each meal should contain half fruits/vegetables, one quarter protein, and one quarter carbs (no bigger than a computer mouse)  Cut down on sweet beverages. This includes juice, soda, and sweet tea.   Drink at least 1 glass of water with each meal and aim for at least 8 glasses per day  Exercise at least 150 minutes every week.

## 2022-05-13 NOTE — Progress Notes (Signed)
Please inform patient of the following:  Her biopsy report shows that she has spongiform dermatitis and is likely due to eczema.  Would like for her to let us know how her symptoms are progressing.  If she is still having a lot of issue we can try a higher strength topical cream but if symptoms are improving we do not need to do anything else at this point.

## 2022-05-15 NOTE — Progress Notes (Signed)
Eczema can sometimes just develop with no prior history.  Bug bites are also possible. Either way, the cream should treat both of these.  Algis Greenhouse. Jerline Pain, MD 05/15/2022 7:31 AM

## 2022-05-21 DIAGNOSIS — K760 Fatty (change of) liver, not elsewhere classified: Secondary | ICD-10-CM | POA: Diagnosis not present

## 2022-05-21 DIAGNOSIS — R748 Abnormal levels of other serum enzymes: Secondary | ICD-10-CM | POA: Diagnosis not present

## 2022-05-23 ENCOUNTER — Other Ambulatory Visit: Payer: Self-pay | Admitting: Family Medicine

## 2022-05-23 ENCOUNTER — Encounter: Payer: Self-pay | Admitting: Gastroenterology

## 2022-05-29 ENCOUNTER — Encounter: Payer: Federal, State, Local not specified - PPO | Admitting: Gastroenterology

## 2022-05-30 DIAGNOSIS — H02824 Cysts of left upper eyelid: Secondary | ICD-10-CM | POA: Diagnosis not present

## 2022-05-30 LAB — HM DIABETES EYE EXAM

## 2022-06-04 ENCOUNTER — Other Ambulatory Visit: Payer: Self-pay | Admitting: Obstetrics and Gynecology

## 2022-06-04 DIAGNOSIS — M542 Cervicalgia: Secondary | ICD-10-CM | POA: Diagnosis not present

## 2022-06-04 DIAGNOSIS — M5451 Vertebrogenic low back pain: Secondary | ICD-10-CM | POA: Diagnosis not present

## 2022-06-04 DIAGNOSIS — M546 Pain in thoracic spine: Secondary | ICD-10-CM | POA: Diagnosis not present

## 2022-06-04 DIAGNOSIS — Z1231 Encounter for screening mammogram for malignant neoplasm of breast: Secondary | ICD-10-CM

## 2022-06-04 DIAGNOSIS — Z6823 Body mass index (BMI) 23.0-23.9, adult: Secondary | ICD-10-CM | POA: Diagnosis not present

## 2022-06-04 DIAGNOSIS — M41122 Adolescent idiopathic scoliosis, cervical region: Secondary | ICD-10-CM | POA: Diagnosis not present

## 2022-06-04 DIAGNOSIS — Z01419 Encounter for gynecological examination (general) (routine) without abnormal findings: Secondary | ICD-10-CM | POA: Diagnosis not present

## 2022-06-04 DIAGNOSIS — F4310 Post-traumatic stress disorder, unspecified: Secondary | ICD-10-CM | POA: Diagnosis not present

## 2022-06-27 ENCOUNTER — Other Ambulatory Visit (INDEPENDENT_AMBULATORY_CARE_PROVIDER_SITE_OTHER): Payer: Federal, State, Local not specified - PPO

## 2022-06-27 ENCOUNTER — Ambulatory Visit: Payer: Federal, State, Local not specified - PPO | Admitting: Family Medicine

## 2022-06-27 ENCOUNTER — Encounter: Payer: Self-pay | Admitting: Family Medicine

## 2022-06-27 VITALS — BP 133/78 | HR 95 | Temp 97.8°F | Ht 64.0 in | Wt 136.0 lb

## 2022-06-27 DIAGNOSIS — R21 Rash and other nonspecific skin eruption: Secondary | ICD-10-CM

## 2022-06-27 DIAGNOSIS — F4310 Post-traumatic stress disorder, unspecified: Secondary | ICD-10-CM | POA: Diagnosis not present

## 2022-06-27 DIAGNOSIS — D509 Iron deficiency anemia, unspecified: Secondary | ICD-10-CM

## 2022-06-27 DIAGNOSIS — E538 Deficiency of other specified B group vitamins: Secondary | ICD-10-CM | POA: Diagnosis not present

## 2022-06-27 DIAGNOSIS — E559 Vitamin D deficiency, unspecified: Secondary | ICD-10-CM

## 2022-06-27 DIAGNOSIS — E119 Type 2 diabetes mellitus without complications: Secondary | ICD-10-CM

## 2022-06-27 LAB — COMPREHENSIVE METABOLIC PANEL
ALT: 64 U/L — ABNORMAL HIGH (ref 0–35)
AST: 34 U/L (ref 0–37)
Albumin: 4.9 g/dL (ref 3.5–5.2)
Alkaline Phosphatase: 110 U/L (ref 39–117)
BUN: 14 mg/dL (ref 6–23)
CO2: 31 mEq/L (ref 19–32)
Calcium: 9.9 mg/dL (ref 8.4–10.5)
Chloride: 102 mEq/L (ref 96–112)
Creatinine, Ser: 0.73 mg/dL (ref 0.40–1.20)
GFR: 96.2 mL/min (ref >60.00)
Glucose, Bld: 138 mg/dL — ABNORMAL HIGH (ref 70–99)
Potassium: 3.6 mEq/L (ref 3.5–5.1)
Sodium: 139 mEq/L (ref 135–145)
Total Bilirubin: 0.5 mg/dL (ref 0.2–1.2)
Total Protein: 7.5 g/dL (ref 6.0–8.3)

## 2022-06-27 LAB — CBC WITH DIFFERENTIAL/PLATELET
Basophils Absolute: 0.1 10*3/uL (ref 0.0–0.1)
Basophils Relative: 1.3 % (ref 0.0–3.0)
Eosinophils Absolute: 0.2 10*3/uL (ref 0.0–0.7)
Eosinophils Relative: 2.1 % (ref 0.0–5.0)
HCT: 37.6 % (ref 36.0–46.0)
Hemoglobin: 13.1 g/dL (ref 12.0–15.0)
Lymphocytes Relative: 28 % (ref 12.0–46.0)
Lymphs Abs: 2.8 10*3/uL (ref 0.7–4.0)
MCHC: 34.9 g/dL (ref 30.0–36.0)
MCV: 89.7 fl (ref 78.0–100.0)
Monocytes Absolute: 0.6 10*3/uL (ref 0.1–1.0)
Monocytes Relative: 6 % (ref 3.0–12.0)
Neutro Abs: 6.2 10*3/uL (ref 1.4–7.7)
Neutrophils Relative %: 62.6 % (ref 43.0–77.0)
Platelets: 288 10*3/uL (ref 150.0–400.0)
RBC: 4.19 Mil/uL (ref 3.87–5.11)
RDW: 12.9 % (ref 11.5–15.5)
WBC: 9.8 10*3/uL (ref 4.0–10.5)

## 2022-06-27 LAB — VITAMIN D 25 HYDROXY (VIT D DEFICIENCY, FRACTURES): VITD: 59.97 ng/mL (ref 30.00–100.00)

## 2022-06-27 LAB — HEMOGLOBIN A1C: Hgb A1c MFr Bld: 6.3 % (ref 4.6–6.5)

## 2022-06-27 LAB — IBC + FERRITIN
Ferritin: 75.1 ng/mL (ref 10.0–291.0)
Iron: 76 ug/dL (ref 42–145)
Saturation Ratios: 20 % (ref 20.0–50.0)
TIBC: 380.8 ug/dL (ref 250.0–450.0)
Transferrin: 272 mg/dL (ref 212.0–360.0)

## 2022-06-27 LAB — VITAMIN B12: Vitamin B-12: 520 pg/mL (ref 211–911)

## 2022-06-27 LAB — MAGNESIUM: Magnesium: 1.8 mg/dL (ref 1.5–2.5)

## 2022-06-27 LAB — TSH: TSH: 2.43 u[IU]/mL (ref 0.35–5.50)

## 2022-06-27 NOTE — Assessment & Plan Note (Signed)
Check vitamin D. 

## 2022-06-27 NOTE — Patient Instructions (Signed)
It was very nice to see you today!  We we will check blood work today.  Please try using Sarna lotion to help with your itching.  Please take a Claritin or Zyrtec daily.  It is okay for you to take hydroxyzine nightly with this.  Let me know next week how you are doing we may need to increase her dose of Zyrtec.  Please keep your appointment with the allergist.  Take care, Dr [name]  PLEASE NOTE:  If you had any lab tests please let us know if you have not heard back within a few days. You may see your results on mychart before we have a chance to review them but we will give you a call once they are reviewed by [place]. If we ordered any referrals today, please let us know if you have not heard from their office within the next week.   Please try these tips to maintain a healthy lifestyle:  Eat at least 3 REAL meals and 1-2 snacks per day.  Aim for no more than 5 hours between eating.  If you eat breakfast, please do so within one hour of getting up.   Each meal should contain half fruits/vegetables, one quarter protein, and one quarter carbs (no bigger than a computer mouse)  Cut down on sweet beverages. This includes juice, soda, and sweet tea.   Drink at least 1 glass of water with each meal and aim for at least 8 glasses per day  Exercise at least 150 minutes every week.

## 2022-06-27 NOTE — Assessment & Plan Note (Signed)
On metformin 500 mg twice daily.  Check A1c. 

## 2022-06-27 NOTE — Assessment & Plan Note (Signed)
Check iron panel and CBC. ?

## 2022-06-27 NOTE — Addendum Note (Signed)
Addended by: Dyann Kief on: 06/27/2022 03:44 PM   Modules accepted: Orders

## 2022-06-27 NOTE — Assessment & Plan Note (Signed)
Check B12 

## 2022-06-27 NOTE — Progress Notes (Signed)
   Selena Holt is a 51 y.o. female who presents today for an office visit.  Assessment/Plan:  New/Acute Problems: Rash No red flags.  Similar appearance to previous reaction a couple months ago.  May have underlying urticaria.  Her biopsy from a couple months ago showed spongiotic dermatitis.  She is on Zyrtec 10 mg daily.  Advised her that would be okay for her to take Benadryl or hydroxyzine at night as needed for itching.  She did not have much improvement with prednisone at her last visit-will not repeat today.  Recommended Sarna lotion.  She will follow-up with me in a couple of days via MyChart.  May consider increasing dose of Zyrtec to 20 mg daily if not improving.  May consider adding on Singulair at some point in the future as well.  Will also check labs today to look for alternative causes.  Check CBC, c-Met, TSH, vitamin D, B12, and magnesium levels.  She does have an appointment with allergist pending to look for any possible precipitating factors.  We discussed reasons to return to care.  Chronic Problems Addressed Today: Vitamin B 12 deficiency Check B12.  Vitamin D deficiency Check vitamin D.  T2DM (type 2 diabetes mellitus) ([place]) On metformin 500 mg twice daily.  Check A1c.  Anemia, iron deficiency Check iron panel and CBC.      Subjective:  HPI:  See A/p for status of chronic conditions.    Patient here today for follow-up.  Last saw her about 2 months ago for ongoing rash.  Biopsy at that time showed spongiotic dermatitis.  We have been treating her with antihistamines and topical steroids.  We also given her dose of prednisone taper however this was not effective.  Symptoms spontaneously subsided shortly after she saw [place].  She was doing well until a week ago when symptoms returned.  Rash is very itchy.  Seems to be worse with scratching.  She has noticed fine bumps throughout her arms and legs.  No obvious exposures or detergents.  No obvious precipitating events.   No fevers or chills.  No mouth swelling.  No difficulty breathing.        Objective:  Physical Exam: BP 133/78   Pulse 95   Temp 97.8 F (36.6 C) (Temporal)   Ht _0  (1.626 m)   Wt 136 lb (61.7 kg)   SpO2 100%   BMI 23.34 kg/m   Gen: No acute distress, resting comfortably CV: Regular rate and rhythm with no murmurs appreciated Pulm: Normal work of breathing, clear to auscultation bilaterally with no crackles, wheezes, or rhonchi Skin: Fine erythematous papular rash involving upper and lower extremities Neuro: Grossly normal, moves all extremities Psych: Normal affect and thought content      Asucena Galer M. Jerline Pain, MD 06/27/2022 2:54 PM

## 2022-07-01 NOTE — Progress Notes (Signed)
Please inform patient of the following:  Her blood sugar is at goal.  A1c 6.3.  She can continue current medications and we can recheck in 6 to 12 months.  The rest of her labs are all stable compared to previous.  No clear cause for her rash.  Would like for her to let us know if her rash is not improving we can discuss next steps in management.

## 2022-07-07 ENCOUNTER — Other Ambulatory Visit: Payer: Self-pay | Admitting: *Deleted

## 2022-07-08 NOTE — Progress Notes (Signed)
Unlikely to be related to hormones. I believe she could be having urticaria. Recommend she increase her zyrtec to 20mg  daily and follow up with soon.

## 2022-07-25 ENCOUNTER — Ambulatory Visit: Payer: Federal, State, Local not specified - PPO

## 2022-07-27 ENCOUNTER — Encounter: Payer: Self-pay | Admitting: Family Medicine

## 2022-07-28 ENCOUNTER — Other Ambulatory Visit (HOSPITAL_BASED_OUTPATIENT_CLINIC_OR_DEPARTMENT_OTHER): Payer: Self-pay

## 2022-07-28 ENCOUNTER — Encounter: Payer: Self-pay | Admitting: Physician Assistant

## 2022-07-28 ENCOUNTER — Telehealth: Payer: Federal, State, Local not specified - PPO | Admitting: Physician Assistant

## 2022-07-28 DIAGNOSIS — U071 COVID-19: Secondary | ICD-10-CM

## 2022-07-28 MED ORDER — MOLNUPIRAVIR EUA 200MG CAPSULE: 4.0000 | ORAL_CAPSULE | Freq: Two times a day (BID) | ORAL | 0 refills | Status: DC

## 2022-07-28 MED ORDER — NIRMATRELVIR/RITONAVIR (PAXLOVID)TABLET: 3.0000 | ORAL_TABLET | Freq: Two times a day (BID) | ORAL | 0 refills | Status: DC

## 2022-07-28 MED ORDER — PROMETHAZINE-DM 6.25-15 MG/5ML PO SYRP: 5.0000 mL | ORAL_SOLUTION | Freq: Four times a day (QID) | ORAL | 0 refills | Status: DC | PRN

## 2022-07-28 MED ORDER — MOLNUPIRAVIR EUA 200MG CAPSULE
4.0000 | ORAL_CAPSULE | Freq: Two times a day (BID) | ORAL | 0 refills | Status: AC
  Filled 2022-07-28: qty 40, 5d supply, fill #0

## 2022-07-28 MED ORDER — LIDOCAINE VISCOUS HCL 2 % MT SOLN: OROMUCOSAL | 0 refills | Status: DC

## 2022-07-28 NOTE — Addendum Note (Signed)
Addended by: Mar Daring on: 07/28/2022 06:39 PM   Modules accepted: Orders

## 2022-07-28 NOTE — Telephone Encounter (Signed)
Spoke with patient, stated symptoms stated yesterday. Advise to schedule a VV with urgent care  For assessment

## 2022-07-28 NOTE — Progress Notes (Signed)
Virtual Visit Consent   Selena Holt, you are scheduled for a virtual visit with a [facility] provider today. Just as with appointments in the office, your consent must be obtained to participate. Your consent will be active for this visit and any virtual visit you may have with one of our providers in the next 365 days. If you have a MyChart account, a copy of this consent can be sent to you electronically.  As this is a virtual visit, video technology does not allow for your provider to perform a traditional examination. This may limit your provider's ability to fully assess your condition. If your provider identifies any concerns that need to be evaluated in person or the need to arrange testing (such as labs, EKG, etc.), we will make arrangements to do so. Although advances in technology are sophisticated, we cannot ensure that it will always work on either your end or our end. If the connection with a video visit is poor, the visit may have to be switched to a telephone visit. With either a video or telephone visit, we are not always able to ensure that we have a secure connection.  By engaging in this virtual visit, you consent to the provision of healthcare and authorize for your insurance to be billed (if applicable) for the services provided during this visit. Depending on your insurance coverage, you may receive a charge related to this service.  I need to obtain your verbal consent now. Are you willing to proceed with your visit today? Selena Holt has provided verbal consent on 07/28/2022 for a virtual visit (video or telephone). Margaretann Loveless, PA-C  Date: 07/28/2022 3:34 PM  Virtual Visit via Video Note   I, Margaretann Loveless, connected with  Selena Holt  ([id number], 09-Apr-1971) on 07/28/22 at  3:15 PM EST by a video-enabled telemedicine application and verified that I am speaking with the correct person using two identifiers.  Location: Patient: Virtual Visit Location  Patient: Home Provider: Virtual Visit Location Provider: Home Office   I discussed the limitations of evaluation and management by telemedicine and the availability of in person appointments. The patient expressed understanding and agreed to proceed.    History of Present Illness: Selena Holt is a 52 y.o. who identifies as a female who was assigned female at birth, and is being seen today for Covid 41.  HPI: URI  This is a new problem. The current episode started yesterday (Tested positive for Covid 19 yesterday; symptoms started yesterday). The problem has been gradually worsening. The maximum temperature recorded prior to her arrival was 100.4 - 100.9 F. The fever has been present for 1 to 2 days. Associated symptoms include congestion, coughing (from post nasal drainage), headaches, a plugged ear sensation (itching and pressure in right), rhinorrhea (post nasal drainage), sinus pain, sneezing and a sore throat. Pertinent negatives include no diarrhea, ear pain, nausea, vomiting or wheezing. Associated symptoms comments: Fatigue, chills, body aches, loss of taste. She has tried antihistamine, NSAIDs and increased fluids (zyrtec, flonase, ibuprofen 400mg ) for the symptoms. The treatment provided no relief.     Problems:  Patient Active Problem List   Diagnosis Date Noted   Hypertension associated with diabetes (HCC) 03/01/2020   Back pain 05/13/2019   Stress 01/14/2018   Hepatocellular dysfunction 01/08/2018   Anemia, iron deficiency 01/03/2015   Vitamin B 12 deficiency 03/09/2014   Dyslipidemia associated with type 2 diabetes mellitus ([facility]CC) 02/10/2014   Vitamin D deficiency 02/10/2014  T2DM (type 2 diabetes mellitus) ([facility]) 11/11/2012    Allergies:  Allergies  Allergen Reactions   Tetanus Toxoids Anaphylaxis    tetanus   Ceclor [Cefaclor] Hives   Doxycycline Other (See Comments)    Hives   Penicillins Hives   Sulfa Antibiotics Hives   Lisinopril Other (See Comments)     fatigue   Medications:  Current Outpatient Medications:    lidocaine (XYLOCAINE) 2 % solution, Swish and swallow 11mL every 4 hours as needed for sore throat and mouth pain, Disp: 100 mL, Rfl: 0   molnupiravir EUA (LAGEVRIO) 200 mg CAPS capsule, Take 4 capsules (800 mg total) by mouth 2 (two) times daily for 5 days., Disp: 40 capsule, Rfl: 0   promethazine-dextromethorphan (PROMETHAZINE-DM) 6.25-15 MG/5ML syrup, Take 5 mLs by mouth 4 (four) times daily as needed., Disp: 118 mL, Rfl: 0   blood glucose meter kit and supplies KIT, Dispense based on patient and insurance preference. Use up to four times daily as directed. (FOR ICD-9 250.00, 250.01)., Disp: 1 each, Rfl: 0   Cyanocobalamin (VITAMIN B-12 PO), Take 1,200 mcg by mouth. Pt only taking twice a week, Disp: , Rfl:    glucose blood (ONETOUCH VERIO) test strip, Use as instructed to check blood sugar up to 3 times daily, Disp: 300 each, Rfl: 0   hydrOXYzine (ATARAX) 50 MG tablet, TAKE 1 TABLET BY MOUTH 3 TIMES DAILY AS NEEDED FOR ANXIETY., Disp: 270 tablet, Rfl: 1   ibuprofen (ADVIL) 200 MG tablet, Take 200 mg by mouth every 6 (six) hours as needed., Disp: , Rfl:    Magnesium 250 MG TABS, Take 1 tablet by mouth daily at 2 PM., Disp: , Rfl:    metFORMIN (GLUCOPHAGE) 500 MG tablet, TAKE 1 TABLET (500 MG TOTAL) BY MOUTH 2 (TWO) TIMES DAILY WITH A MEAL., Disp: 180 tablet, Rfl: 3   OneTouch Delica Lancets 33G MISC, Use daily as needed to check blood sugar., Disp: 100 each, Rfl: 2   triamcinolone ointment (KENALOG) 0.5 %, Apply 1 Application topically 2 (two) times daily., Disp: 30 g, Rfl: 0  Observations/Objective: Patient is well-developed, well-nourished in no acute distress.  Resting comfortably at home.  Head is normocephalic, atraumatic.  No labored breathing.  Speech is clear and coherent with logical content.  Patient is alert and oriented at baseline.    Assessment and Plan: 1. COVID-19 - molnupiravir EUA (LAGEVRIO) 200 mg CAPS  capsule; Take 4 capsules (800 mg total) by mouth 2 (two) times daily for 5 days.  Dispense: 40 capsule; Refill: 0 - lidocaine (XYLOCAINE) 2 % solution; Swish and swallow 36mL every 4 hours as needed for sore throat and mouth pain  Dispense: 100 mL; Refill: 0 - promethazine-dextromethorphan (PROMETHAZINE-DM) 6.25-15 MG/5ML syrup; Take 5 mLs by mouth 4 (four) times daily as needed.  Dispense: 118 mL; Refill: 0  - Continue OTC symptomatic management of choice - Will send OTC vitamins and supplement information through AV[facility] - Molnupiravir, Viscous lidocaine, and Promethazine DM prescribed - Continue Flonase - Patient enrolled in MyChart symptom monitoring - Push fluids - Rest as needed - Discussed return precautions and when to seek in-person evaluation, sent via AVS as well   Follow Up Instructions: I discussed the assessment and treatment plan with the patient. The patient was provided an opportunity to ask questions and all were answered. The patient agreed with the plan and demonstrated an understanding of the instructions.  A copy of instructions were sent to the patient via MyChart unless otherwise  noted below.    The patient was advised to call back or seek an in-person evaluation if the symptoms worsen or if the condition fails to improve as anticipated.  Time:  I spent 15 minutes with the patient via telehealth technology discussing the above problems/concerns.    Mar Daring, PA-C

## 2022-07-28 NOTE — Telephone Encounter (Signed)
Spoke with patient, symptoms started yesterday

## 2022-07-28 NOTE — Addendum Note (Signed)
Addended by: Mar Daring on: 07/28/2022 06:29 PM   Modules accepted: Orders

## 2022-07-28 NOTE — Telephone Encounter (Signed)
Agree with office visit.  Algis Greenhouse. Jerline Pain, MD 07/28/2022 1:51 PM

## 2022-07-28 NOTE — Patient Instructions (Signed)
Selena Holt, thank you for joining Margaretann Loveless, PA-C for today's virtual visit.  While this provider is not your primary care provider (PCP), if your PCP is located in our provider database this encounter information will be shared with them immediately following your visit.   A [facility] MyChart account gives you access to today's visit and all your visits, tests, and labs performed at [facility] " click here if you don't have a [facility] MyChart account or go to [email or web address]  Consent: (Patient) Selena Holt provided verbal consent for this virtual visit at the beginning of the encounter.  Current Medications:  Current Outpatient Medications:    lidocaine (XYLOCAINE) 2 % solution, Swish and swallow 55mL every 4 hours as needed for sore throat and mouth pain, Disp: 100 mL, Rfl: 0   molnupiravir EUA (LAGEVRIO) 200 mg CAPS capsule, Take 4 capsules (800 mg total) by mouth 2 (two) times daily for 5 days., Disp: 40 capsule, Rfl: 0   promethazine-dextromethorphan (PROMETHAZINE-DM) 6.25-15 MG/5ML syrup, Take 5 mLs by mouth 4 (four) times daily as needed., Disp: 118 mL, Rfl: 0   blood glucose meter kit and supplies KIT, Dispense based on patient and insurance preference. Use up to four times daily as directed. (FOR ICD-9 250.00, 250.01)., Disp: 1 each, Rfl: 0   Cyanocobalamin (VITAMIN B-12 PO), Take 1,200 mcg by mouth. Pt only taking twice a week, Disp: , Rfl:    glucose blood (ONETOUCH VERIO) test strip, Use as instructed to check blood sugar up to 3 times daily, Disp: 300 each, Rfl: 0   hydrOXYzine (ATARAX) 50 MG tablet, TAKE 1 TABLET BY MOUTH 3 TIMES DAILY AS NEEDED FOR ANXIETY., Disp: 270 tablet, Rfl: 1   ibuprofen (ADVIL) 200 MG tablet, Take 200 mg by mouth every 6 (six) hours as needed., Disp: , Rfl:    Magnesium 250 MG TABS, Take 1 tablet by mouth daily at 2 PM., Disp: , Rfl:    metFORMIN (GLUCOPHAGE) 500 MG tablet, TAKE 1 TABLET (500 MG TOTAL) BY MOUTH  2 (TWO) TIMES DAILY WITH A MEAL., Disp: 180 tablet, Rfl: 3   OneTouch Delica Lancets 33G MISC, Use daily as needed to check blood sugar., Disp: 100 each, Rfl: 2   triamcinolone ointment (KENALOG) 0.5 %, Apply 1 Application topically 2 (two) times daily., Disp: 30 g, Rfl: 0   Medications ordered in this encounter:  Meds ordered this encounter  Medications   molnupiravir EUA (LAGEVRIO) 200 mg CAPS capsule    Sig: Take 4 capsules (800 mg total) by mouth 2 (two) times daily for 5 days.    Dispense:  40 capsule    Refill:  0    Order Specific Question:   Supervising Provider    Answer:   Merrilee Jansky [[id number]]   lidocaine (XYLOCAINE) 2 % solution    Sig: Swish and swallow 74mL every 4 hours as needed for sore throat and mouth pain    Dispense:  100 mL    Refill:  0    Order Specific Question:   Supervising Provider    Answer:   Merrilee Jansky [[id number]]   promethazine-dextromethorphan (PROMETHAZINE-DM) 6.25-15 MG/5ML syrup    Sig: Take 5 mLs by mouth 4 (four) times daily as needed.    Dispense:  118 mL    Refill:  0    Order Specific Question:   Supervising Provider    Answer:   Merrilee Jansky [id number]     *If you  need refills on other medications prior to your next appointment, please contact your pharmacy*  Follow-Up: Call back or seek an in-person evaluation if the symptoms worsen or if the condition fails to improve as anticipated.  [facility] [phone]  Other Instructions  Can take to lessen severity: Vit C 500mg  twice daily Quercertin 250-500mg  twice daily Zinc 75-100mg  daily Melatonin 3-6 mg at bedtime Vit D3 1000-2000 IU daily Aspirin 81 mg daily with food Optional: Famotidine 20mg  daily Also can add tylenol/ibuprofen as needed for fevers and body aches May add Mucinex or Mucinex DM as needed for cough/congestion   COVID-19 COVID-19, or coronavirus disease 2019, is an infection that is caused by a new (novel) coronavirus called  SARS-CoV-2. COVID-19 can cause many symptoms. In some people, the virus may not cause any symptoms. In others, it may cause mild or severe symptoms. Some people with severe infection develop severe disease. What are the causes? This illness is caused by a virus. The virus may be in the air as tiny specks of fluid (aerosols) or droplets, or it may be on surfaces. You may catch the virus by: Breathing in droplets from an infected person. Droplets can be spread by a person breathing, speaking, singing, coughing, or sneezing. Touching something, like a table or a doorknob, that has virus on it (is contaminated) and then touching your mouth, nose, or eyes. What increases the risk? Risk for infection: You are more likely to get infected with the COVID-19 virus if: You are within 6 ft (1.8 m) of a person with COVID-19 for 15 minutes or longer. You are providing care for a person who is infected with COVID-19. You are in close personal contact with other people. Close personal contact includes hugging, kissing, or sharing eating or drinking utensils. Risk for serious illness caused by COVID-19: You are more likely to get seriously ill from the COVID-19 virus if: You have cancer. You have a long-term (chronic) disease, such as: Chronic lung disease. This includes pulmonary embolism, chronic obstructive pulmonary disease, and cystic fibrosis. Long-term disease that lowers your body's ability to fight infection (immunocompromise). Serious cardiac conditions, such as heart failure, coronary artery disease, or cardiomyopathy. Diabetes. Chronic kidney disease. Liver diseases. These include cirrhosis, nonalcoholic fatty liver disease, alcoholic liver disease, or autoimmune hepatitis. You have obesity. You are pregnant or were recently pregnant. You have sickle cell disease. What are the signs or symptoms? Symptoms of this condition can range from mild to severe. Symptoms may appear any time from 2 to 14  days after being exposed to the virus. They include: Fever or chills. Shortness of breath or trouble breathing. Feeling tired or very tired. Headaches, body aches, or muscle aches. Runny or stuffy nose, sneezing, coughing, or sore throat. New loss of taste or smell. This is rare. Some people may also have stomach problems, such as nausea, vomiting, or diarrhea. Other people may not have any symptoms of COVID-19. How is this diagnosed? This condition may be diagnosed by testing samples to check for the COVID-19 virus. The most common tests are the PCR test and the antigen test. Tests may be done in the lab or at home. They include: Using a swab to take a sample of fluid from the back of your nose and throat (nasopharyngeal fluid), from your nose, or from your throat. Testing a sample of saliva from your mouth. Testing a sample of coughed-up mucus from your lungs (sputum). How is this treated? Treatment for COVID-19 infection  depends on the severity of the condition. Mild symptoms can be managed at home with rest, fluids, and over-the-counter medicines. Serious symptoms may be treated in a hospital intensive care unit (ICU). Treatment in the ICU may include: Supplemental oxygen. Extra oxygen is given through a tube in the nose, a face mask, or a hood. Medicines. These may include: Antivirals, such as monoclonal antibodies. These help your body fight off certain viruses that can cause disease. Anti-inflammatories, such as corticosteroids. These reduce inflammation and suppress the immune system. Antithrombotics. These prevent or treat blood clots, if they develop. Convalescent plasma. This helps boost your immune system, if you have an underlying immunosuppressive condition or are getting immunosuppressive treatments. Prone positioning. This means you will lie on your stomach. This helps oxygen to get into your lungs. Infection control measures. If you are at risk for more serious illness  caused by COVID-19, your health care provider may prescribe two long-acting monoclonal antibodies, given together every 6 months. How is this prevented? To protect yourself: Use preventive medicine (pre-exposure prophylaxis). You may get pre-exposure prophylaxis if you have moderate or severe immunocompromise. Get vaccinated. Anyone 52 months old or older who meets guidelines can get a COVID-19 vaccine or vaccine series. This includes people who are pregnant or making breast milk (lactating). Get an added dose of COVID-19 vaccine after your first vaccine or vaccine series if you have moderate to severe immunocompromise. This applies if you have had a solid organ transplant or have been diagnosed with an immunocompromising condition. You should get the added dose 4 weeks after you got the first COVID-19 vaccine or vaccine series. If you get an mRNA vaccine, you will need a 3-dose primary series. If you get the J&J/Janssen vaccine, you will need a 2-dose primary series, with the second dose being an mRNA vaccine. Talk to your health care provider about getting experimental monoclonal antibodies. This treatment is approved under emergency use authorization to prevent severe illness before or after being exposed to the COVID-19 virus. You may be given monoclonal antibodies if: You have moderate or severe immunocompromise. This includes treatments that lower your immune response. People with immunocompromise may not develop protection against COVID-19 when they are vaccinated. You cannot be vaccinated. You may not get a vaccine if you have a severe allergic reaction to the vaccine or its components. You are not fully vaccinated. You are in a facility where COVID-19 is present and: Are in close contact with a person who is infected with the COVID-19 virus. Are at high risk of being exposed to the COVID-19 virus. You are at risk of illness from new variants of the COVID-19 virus. To protect others: If you  have symptoms of COVID-19, take steps to prevent the virus from spreading to others. Stay home. Leave your house only to get medical care. Do not use public transit, if possible. Do not travel while you are sick. Wash your hands often with soap and water for at least 20 seconds. If soap and water are not available, use alcohol-based hand sanitizer. Make sure that all people in your household wash their hands well and often. Cough or sneeze into a tissue or your sleeve or elbow. Do not cough or sneeze into your hand or into the air. Where to find more information Centers for Disease Control and Prevention: https://www.clark-whitaker.org/ World Health Organization: https://thompson-craig.com/ Get help right away if: You have trouble breathing. You have pain or pressure in your chest. You are confused. You have bluish lips  and fingernails. You have trouble waking from sleep. You have symptoms that get worse. These symptoms may be an emergency. Get help right away. Call 911. Do not wait to see if the symptoms will go away. Do not drive yourself to the hospital. Summary COVID-19 is an infection that is caused by a new coronavirus. Sometimes, there are no symptoms. Other times, symptoms range from mild to severe. Some people with a severe COVID-19 infection develop severe disease. The virus that causes COVID-19 can spread from person to person through droplets or aerosols from breathing, speaking, singing, coughing, or sneezing. Mild symptoms of COVID-19 can be managed at home with rest, fluids, and over-the-counter medicines. This information is not intended to replace advice given to you by your health care provider. Make sure you discuss any questions you have with your health care provider. Document Revised: 06/25/2021 Document Reviewed: 06/27/2021 Elsevier Patient Education  [place].    If you have been instructed to have an in-person evaluation today at a local Urgent Care  facility, please use the link below. It will take you to a list of all of our available [facility], including address, phone number and hours of operation. Please do not delay care.  [facility]  If you or a family member do not have a primary care provider, use the link below to schedule a visit and establish care. When you choose a [facility] primary care physician or advanced practice provider, you gain a long-term partner in health. Find a Primary Care Provider  Learn more about 's in-office and virtual care options: [facility]

## 2022-07-29 ENCOUNTER — Other Ambulatory Visit (HOSPITAL_BASED_OUTPATIENT_CLINIC_OR_DEPARTMENT_OTHER): Payer: Self-pay

## 2022-07-29 ENCOUNTER — Encounter: Payer: Self-pay | Admitting: Family Medicine

## 2022-07-30 NOTE — Telephone Encounter (Signed)
Form printed and placed in PCP office

## 2022-08-07 ENCOUNTER — Ambulatory Visit: Payer: Self-pay | Admitting: Allergy & Immunology

## 2022-08-11 ENCOUNTER — Telehealth: Payer: Self-pay | Admitting: *Deleted

## 2022-08-11 NOTE — Telephone Encounter (Signed)
LVM  FMLA form ready to be pick up Placed at front office copy placed to be scan on pt chart

## 2022-09-01 ENCOUNTER — Ambulatory Visit: Payer: Federal, State, Local not specified - PPO | Admitting: Family Medicine

## 2022-09-01 ENCOUNTER — Other Ambulatory Visit (HOSPITAL_COMMUNITY)
Admission: RE | Admit: 2022-09-01 | Discharge: 2022-09-01 | Disposition: A | Discharge status: Home / Self Care | Payer: Federal, State, Local not specified - PPO | Source: Ambulatory Visit | Attending: Family Medicine | Admitting: Family Medicine

## 2022-09-01 ENCOUNTER — Encounter: Payer: Self-pay | Admitting: Family Medicine

## 2022-09-01 VITALS — BP 119/79 | HR 83 | Temp 97.7°F | Ht 64.0 in | Wt 137.0 lb

## 2022-09-01 DIAGNOSIS — E1169 Type 2 diabetes mellitus with other specified complication: Secondary | ICD-10-CM

## 2022-09-01 DIAGNOSIS — E119 Type 2 diabetes mellitus without complications: Secondary | ICD-10-CM | POA: Diagnosis not present

## 2022-09-01 DIAGNOSIS — E785 Hyperlipidemia, unspecified: Secondary | ICD-10-CM | POA: Diagnosis not present

## 2022-09-01 DIAGNOSIS — D509 Iron deficiency anemia, unspecified: Secondary | ICD-10-CM

## 2022-09-01 DIAGNOSIS — N898 Other specified noninflammatory disorders of vagina: Secondary | ICD-10-CM | POA: Diagnosis not present

## 2022-09-01 LAB — LIPID PANEL
Cholesterol: 198 mg/dL (ref 0–200)
HDL: 46.6 mg/dL (ref >39.00)
LDL Cholesterol: 117 mg/dL — ABNORMAL HIGH (ref 0–99)
NonHDL: 151.41
Total CHOL/HDL Ratio: 4
Triglycerides: 170 mg/dL — ABNORMAL HIGH (ref 0.0–149.0)
VLDL: 34 mg/dL (ref 0.0–40.0)

## 2022-09-01 LAB — URINALYSIS, ROUTINE W REFLEX MICROSCOPIC
Bilirubin Urine: NEGATIVE
Hgb urine dipstick: NEGATIVE
Ketones, ur: NEGATIVE
Leukocytes,Ua: NEGATIVE
Nitrite: NEGATIVE
RBC / HPF: NONE SEEN (ref 0–?)
Specific Gravity, Urine: 1.02 (ref 1.000–1.030)
Total Protein, Urine: NEGATIVE
Urine Glucose: NEGATIVE
Urobilinogen, UA: 0.2 (ref 0.0–1.0)
pH: 6 (ref 5.0–8.0)

## 2022-09-01 LAB — COMPREHENSIVE METABOLIC PANEL
ALT: 33 U/L (ref 0–35)
AST: 19 U/L (ref 0–37)
Albumin: 4.8 g/dL (ref 3.5–5.2)
Alkaline Phosphatase: 121 U/L — ABNORMAL HIGH (ref 39–117)
BUN: 14 mg/dL (ref 6–23)
CO2: 26 mEq/L (ref 19–32)
Calcium: 9.8 mg/dL (ref 8.4–10.5)
Chloride: 102 mEq/L (ref 96–112)
Creatinine, Ser: 0.7 mg/dL (ref 0.40–1.20)
GFR: 101.04 mL/min (ref >60.00)
Glucose, Bld: 153 mg/dL — ABNORMAL HIGH (ref 70–99)
Potassium: 3.9 mEq/L (ref 3.5–5.1)
Sodium: 139 mEq/L (ref 135–145)
Total Bilirubin: 0.5 mg/dL (ref 0.2–1.2)
Total Protein: 7.3 g/dL (ref 6.0–8.3)

## 2022-09-01 LAB — IBC + FERRITIN
Ferritin: 48.7 ng/mL (ref 10.0–291.0)
Iron: 93 ug/dL (ref 42–145)
Saturation Ratios: 22.2 % (ref 20.0–50.0)
TIBC: 418.6 ug/dL (ref 250.0–450.0)
Transferrin: 299 mg/dL (ref 212.0–360.0)

## 2022-09-01 LAB — CBC
HCT: 38.2 % (ref 36.0–46.0)
Hemoglobin: 13.3 g/dL (ref 12.0–15.0)
MCHC: 34.8 g/dL (ref 30.0–36.0)
MCV: 92 fl (ref 78.0–100.0)
Platelets: 288 10*3/uL (ref 150.0–400.0)
RBC: 4.15 Mil/uL (ref 3.87–5.11)
RDW: 13.5 % (ref 11.5–15.5)
WBC: 9.2 10*3/uL (ref 4.0–10.5)

## 2022-09-01 LAB — MICROALBUMIN / CREATININE URINE RATIO
Creatinine,U: 94.6 mg/dL
Microalb Creat Ratio: 7.6 mg/g (ref 0.0–30.0)
Microalb, Ur: 7.2 mg/dL — ABNORMAL HIGH (ref 0.0–1.9)

## 2022-09-01 LAB — TSH: TSH: 4.23 u[IU]/mL (ref 0.35–5.50)

## 2022-09-01 LAB — HEMOGLOBIN A1C: Hgb A1c MFr Bld: 6.3 % (ref 4.6–6.5)

## 2022-09-01 NOTE — Progress Notes (Signed)
   Selena Holt is a 52 y.o. female who presents today for an office visit.  Assessment/Plan:  New/Acute Problems: COVID Reassuring exam today.  She has recovered completely.  No residual symptoms.  We will update her FMLA paperwork per her request today.  Polydipsia Check labs including UA, A1c, CBC and c-Met.  Vaginal Discharge Symptoms are improving.  History consistent with yeast vaginitis.  We will check self swab cytology.  Checking A1c as above as well.   Chronic Problems Addressed Today: T2DM (type 2 diabetes mellitus) ([place]) Patient tells me she has been off metformin for the past year or so.  She has been doing well off this.  Last A1c was at goal.  We will check A1c again today.  She will continue to work on diet and exercise.  Dyslipidemia associated with type 2 diabetes mellitus (HCC) Check lipids.  Discussed lifestyle modifications.  Anemia, iron deficiency Check CBC and iron panel.     Subjective:  HPI:  See A/p for status of chronic conditions. She is here today for follow up.  She has been off metformin the last year or so. Her last A1c was at goal. She is working on diet and exercise.   She did have covid a month ago Symptoms were much more mild than previous. She has recovered completely.   She has been noticing increased thirst for the last few weeks.  She is won  She has also been having some issues with vaginal discharge. She was concered about a yeast infection. Tried a cream she got from GYN with some improvement but then it got worse. She then started getting some itch and burning. Called GYN who could not fit her in. They called in diflucan but she did not start this. Symptoms have been improving the last few days. She is worried about UTI.        Objective:  Physical Exam: BP 119/79   Pulse 83   Temp 97.7 F (36.5 C) (Temporal)   Ht 5\' 4"  (1.626 m)   Wt 137 lb (62.1 kg)   SpO2 99%   BMI 23.52 kg/m   Wt Readings from Last 3 Encounters:   09/01/22 137 lb (62.1 kg)  06/27/22 136 lb (61.7 kg)  05/01/22 134 lb 12.8 oz (61.1 kg)  Gen: No acute distress, resting comfortably CV: Regular rate and rhythm with no murmurs appreciated Pulm: Normal work of breathing, clear to auscultation bilaterally with no crackles, wheezes, or rhonchi Neuro: Grossly normal, moves all extremities Psych: Normal affect and thought content  Time Spent: 30 minutes of total time was spent on the date of the encounter performing the following actions: chart review prior to seeing the patient, obtaining history, performing a medically necessary exam, counseling on the treatment plan, completing requested FMLA paperwork, placing orders, and documenting in our EHR.        Algis Greenhouse. Jerline Pain, MD 09/01/2022 7:53 AM

## 2022-09-01 NOTE — Assessment & Plan Note (Signed)
Check lipids. Discussed lifestyle modifications.  

## 2022-09-01 NOTE — Assessment & Plan Note (Signed)
Patient tells me she has been off metformin for the past year or so.  She has been doing well off this.  Last A1c was at goal.  We will check A1c again today.  She will continue to work on diet and exercise.

## 2022-09-01 NOTE — Patient Instructions (Addendum)
It was very nice to see you today!  We will check blood work today.  We will also check a vaginal swab and a urine culture.  Please keep working on diet and exercise.  We will see you back in 3-6 months. Come back sooner if needed.   Take care, Dr Jerline Pain  PLEASE NOTE:  If you had any lab tests, please let us know if you have not heard back within a few days. You may see your results on mychart before we have a chance to review them but we will give you a call once they are reviewed by [place].   If we ordered any referrals today, please let us know if you have not heard from their office within the next week.   If you had any urgent prescriptions sent in today, please check with the pharmacy within an hour of our visit to make sure the prescription was transmitted appropriately.   Please try these tips to maintain a healthy lifestyle:  Eat at least 3 REAL meals and 1-2 snacks per day.  Aim for no more than 5 hours between eating.  If you eat breakfast, please do so within one hour of getting up.   Each meal should contain half fruits/vegetables, one quarter protein, and one quarter carbs (no bigger than a computer mouse)  Cut down on sweet beverages. This includes juice, soda, and sweet tea.   Drink at least 1 glass of water with each meal and aim for at least 8 glasses per day  Exercise at least 150 minutes every week.    Preventive Care 76-21 Years Old, Female Preventive care refers to lifestyle choices and visits with your health care provider that can promote health and wellness. Preventive care visits are also called wellness exams. What can I expect for my preventive care visit? Counseling Your health care provider may ask you questions about your: Medical history, including: Past medical problems. Family medical history. Pregnancy history. Current health, including: Menstrual cycle. Method of birth control. Emotional well-being. Home life and relationship  well-being. Sexual activity and sexual health. Lifestyle, including: Alcohol, nicotine or tobacco, and drug use. Access to firearms. Diet, exercise, and sleep habits. Work and work Statistician. Sunscreen use. Safety issues such as seatbelt and bike helmet use. Physical exam Your health care provider will check your: Height and weight. These may be used to calculate your BMI (body mass index). BMI is a measurement that tells if you are at a healthy weight. Waist circumference. This measures the distance around your waistline. This measurement also tells if you are at a healthy weight and may help predict your risk of certain diseases, such as type 2 diabetes and high blood pressure. Heart rate and blood pressure. Body temperature. Skin for abnormal spots. What immunizations do I need?  Vaccines are usually given at various ages, according to a schedule. Your health care provider will recommend vaccines for you based on your age, medical history, and lifestyle or other factors, such as travel or where you work. What tests do I need? Screening Your health care provider may recommend screening tests for certain conditions. This may include: Lipid and cholesterol levels. Diabetes screening. This is done by checking your blood sugar (glucose) after you have not eaten for a while (fasting). Pelvic exam and Pap test. Hepatitis B test. Hepatitis C test. HIV (human immunodeficiency virus) test. STI (sexually transmitted infection) testing, if you are at risk. Lung cancer screening. Colorectal cancer screening. Mammogram. Talk with  your health care provider about when you should start having regular mammograms. This may depend on whether you have a family history of breast cancer. BRCA-related cancer screening. This may be done if you have a family history of breast, ovarian, tubal, or peritoneal cancers. Bone density scan. This is done to screen for osteoporosis. Talk with your health care  provider about your test results, treatment options, and if necessary, the need for more tests. Follow these instructions at home: Eating and drinking  Eat a diet that includes fresh fruits and vegetables, whole grains, lean protein, and low-fat dairy products. Take vitamin and mineral supplements as recommended by your health care provider. Do not drink alcohol if: Your health care provider tells you not to drink. You are pregnant, may be pregnant, or are planning to become pregnant. If you drink alcohol: Limit how much you have to 0-1 drink a day. Know how much alcohol is in your drink. In the [place] one drink equals one 12 oz bottle of beer (355 mL), one 5 oz glass of wine (148 mL), or one 1 oz glass of hard liquor (44 mL). Lifestyle Brush your teeth every morning and night with fluoride toothpaste. Floss one time each day. Exercise for at least 30 minutes 5 or more days each week. Do not use any products that contain nicotine or tobacco. These products include cigarettes, chewing tobacco, and vaping devices, such as e-cigarettes. If you need help quitting, ask your health care provider. Do not use drugs. If you are sexually active, practice safe sex. Use a condom or other form of protection to prevent STIs. If you do not wish to become pregnant, use a form of birth control. If you plan to become pregnant, see your health care provider for a prepregnancy visit. Take aspirin only as told by your health care provider. Make sure that you understand how much to take and what form to take. Work with your health care provider to find out whether it is safe and beneficial for you to take aspirin daily. Find healthy ways to manage stress, such as: Meditation, yoga, or listening to music. Journaling. Talking to a trusted person. Spending time with friends and family. Minimize exposure to UV radiation to reduce your risk of skin cancer. Safety Always wear your seat belt while driving or riding in  a vehicle. Do not drive: If you have been drinking alcohol. Do not ride with someone who has been drinking. When you are tired or distracted. While texting. If you have been using any mind-altering substances or drugs. Wear a helmet and other protective equipment during sports activities. If you have firearms in your house, make sure you follow all gun safety procedures. Seek help if you have been physically or sexually abused. What's next? Visit your health care provider once a year for an annual wellness visit. Ask your health care provider how often you should have your eyes and teeth checked. Stay up to date on all vaccines. This information is not intended to replace advice given to you by your health care provider. Make sure you discuss any questions you have with your health care provider. Document Revised: 01/02/2021 Document Reviewed: 01/02/2021 Elsevier Patient Education  [place].

## 2022-09-01 NOTE — Assessment & Plan Note (Signed)
Check CBC and iron panel.

## 2022-09-02 LAB — CERVICOVAGINAL ANCILLARY ONLY
Bacterial Vaginitis (gardnerella): NEGATIVE
Candida Glabrata: NEGATIVE
Candida Vaginitis: NEGATIVE
Chlamydia: NEGATIVE
Comment: NEGATIVE
Comment: NEGATIVE
Comment: NEGATIVE
Comment: NEGATIVE
Comment: NEGATIVE
Comment: NORMAL
Neisseria Gonorrhea: NEGATIVE
Trichomonas: NEGATIVE

## 2022-09-02 LAB — URINE CULTURE
MICRO NUMBER:: 14552146
Result:: NO GROWTH
SPECIMEN QUALITY:: ADEQUATE

## 2022-09-04 NOTE — Progress Notes (Signed)
Please inform patient of the following:  Her cholesterol level is stable compared to last year.  LDL is slightly elevated.  It would be ideal to try to get this less than 100. If she is agreeable, please send in prescription for Lipitor 20 mg daily.  We can recheck this again in 6 to 12 months.   Her vaginal swab and urinary test were negative-no signs of infection.  Would like for her to let us know if the symptoms or not improving.  Her A1c is stable compared to previous.  Do not need to make any changes for her medication or treatment plan otherwise at this time.  She should continue to work on diet and exercise and we can recheck her A1c in 3 to 6 months.  We can recheck everything else in a year.

## 2022-09-08 ENCOUNTER — Other Ambulatory Visit: Payer: Self-pay

## 2022-09-08 ENCOUNTER — Encounter: Payer: Self-pay | Admitting: Allergy

## 2022-09-08 ENCOUNTER — Ambulatory Visit: Payer: Federal, State, Local not specified - PPO | Admitting: Allergy

## 2022-09-08 VITALS — BP 120/72 | HR 92 | Temp 98.1°F | Resp 20 | Ht 64.0 in | Wt 138.4 lb

## 2022-09-08 DIAGNOSIS — R21 Rash and other nonspecific skin eruption: Secondary | ICD-10-CM | POA: Diagnosis not present

## 2022-09-08 DIAGNOSIS — L299 Pruritus, unspecified: Secondary | ICD-10-CM | POA: Diagnosis not present

## 2022-09-08 DIAGNOSIS — T63481A Toxic effect of venom of other arthropod, accidental (unintentional), initial encounter: Secondary | ICD-10-CM

## 2022-09-08 DIAGNOSIS — Z8709 Personal history of other diseases of the respiratory system: Secondary | ICD-10-CM | POA: Diagnosis not present

## 2022-09-08 DIAGNOSIS — J3089 Other allergic rhinitis: Secondary | ICD-10-CM | POA: Insufficient documentation

## 2022-09-08 DIAGNOSIS — K9049 Malabsorption due to intolerance, not elsewhere classified: Secondary | ICD-10-CM

## 2022-09-08 DIAGNOSIS — F4310 Post-traumatic stress disorder, unspecified: Secondary | ICD-10-CM | POA: Diagnosis not present

## 2022-09-08 NOTE — Patient Instructions (Signed)
Rash Keep track of rash flares and take pictures.  Etiology unclear. Unlikely to be related to foods or environmental.   If you break out the next time: Start zyrtec (cetirizine) 41m twice a day. If symptoms are not controlled or causes drowsiness let [facility]now. Start pepcid (famotidine) 246mtwice a day.    Avoid the following potential triggers: alcohol, tight clothing, NSAIDs, hot showers and getting overheated. Get bloodwork:  We are ordering labs, so please allow 1-2 weeks for the results to come back. With the newly implemented Cures Act, the labs might be visible to you at the same time that they become visible to me. However, I will not address the results until all of the results are back, so please be patient.    Environmental allergies See below for environmental control measures. Use over the counter antihistamines such as Zyrtec (cetirizine), Claritin (loratadine), Allegra (fexofenadine), or Xyzal (levocetirizine) daily as needed. May take twice a day during allergy flares. May switch antihistamines every few months. Use Flonase (fluticasone) nasal spray 1 spray per nostril twice a day as needed for nasal congestion.   Food intolerance Avoid gluten and corn if it gives you issues.  Follow up as needed.   Skin care recommendations  Bath time: Always use lukewarm water. AVOID very hot or cold water. Keep bathing time to 5-10 minutes. Do NOT use bubble bath. Use a mild soap and use just enough to wash the dirty areas. Do NOT scrub skin vigorously.  After bathing, pat dry your skin with a towel. Do NOT rub or scrub the skin.  Moisturizers and prescriptions:  ALWAYS apply moisturizers immediately after bathing (within 3 minutes). This helps to lock-in moisture. Use the moisturizer several times a day over the whole body. Good summer moisturizers include: Aveeno, CeraVe, Cetaphil. Good winter moisturizers include: Aquaphor, Vaseline, Cerave, Cetaphil, Eucerin,  Vanicream. When using moisturizers along with medications, the moisturizer should be applied about one hour after applying the medication to prevent diluting effect of the medication or moisturize around where you applied the medications. When not using medications, the moisturizer can be continued twice daily as maintenance.  Laundry and clothing: Avoid laundry products with added color or perfumes. Use unscented hypo-allergenic laundry products such as Tide free, Cheer free & gentle, and All free and clear.  If the skin still seems dry or sensitive, you can try double-rinsing the clothes. Avoid tight or scratchy clothing such as wool. Do not use fabric softeners or dyer sheets.

## 2022-09-08 NOTE — Assessment & Plan Note (Signed)
Asthma as a child. Denies symptoms and no inhaler use recently. Monitor symptoms.

## 2022-09-08 NOTE — Assessment & Plan Note (Signed)
2 episodes of extremely pruritus whole body rash lasting 3 weeks at a time. No triggers noted. Denies changes in diet, meds, personal care products. Tried antihistamines and steroids with unknown benefit. Concerned about allergic triggers. Biopsy showed spongiform dermatitis. 2024 CMP, TSH normal. Keep track of rash flares and take pictures.  Etiology unclear. Unlikely to be related to foods or environmental allergies based on clinical history.  Avoid the following potential triggers: alcohol, tight clothing, NSAIDs, hot showers and getting overheated. Get bloodwork to rule out other etiologies.   If you break out the next time - let us know.  Start zyrtec (cetirizine) 5m twice a day. If symptoms are not controlled or causes drowsiness let [facility]now. Start Pepcid (famotidine) 226mtwice a day.

## 2022-09-08 NOTE — Progress Notes (Signed)
New Patient Note  RE: Selena Holt MRN: [id number] DOB: 1971-06-25 Date of Office Visit: 09/08/2022  Consult requested by: Vivi Barrack, MD Primary care provider: Vivi Barrack, MD  Chief Complaint: Other (Hives started in oct 4th  2023 lasted for 3 weeks and another on nov 27th 2023 but hasn't had it since.)  History of Present Illness: I had the pleasure of seeing Selena Holt for initial evaluation at the [facility] on 09/08/2022. She is a 52 y.o. female, who is referred here by Vivi Barrack, MD for the evaluation of rash.  She had 2 episodes of rash outbreak.   The first episode occurred in October 2023. She was stacking some envelopes on her legs and broke out in a big rash on the right side of her thigh which then traveled to the other side and then to the whole body. Initially was concerned if a spider bit her.   No ecchymosis upon resolution. Associated symptoms include: none.  Suspected triggers are unknown. Denies any fevers, chills, changes in medications, foods, personal care products or recent infections.   She has tried the following therapies: zyrtec, benadryl, prednisone with unknown benefit. Systemic steroids yes.   She had negative shingles test.   She had another flare in Nov 2023 and had intense itching for another 3 weeks.  No triggers noted for this.  Previous work up includes: skin biopsy showed "spongiform dermatitis".  Previous history of rash/hives: none. Patient is up to date with the following cancer screening tests: physical exam, pap smears. Patient due for colonoscopy and mammogram.  Assessment and Plan: Selena Holt is a 52 y.o. female with: Rash and other nonspecific skin eruption 2 episodes of extremely pruritus whole body rash lasting 3 weeks at a time. No triggers noted. Denies changes in diet, meds, personal care products. Tried antihistamines and steroids with unknown benefit. Concerned about allergic triggers.  Biopsy showed spongiform dermatitis. 2024 CMP, TSH normal. Keep track of rash flares and take pictures.  Etiology unclear. Unlikely to be related to foods or environmental allergies based on clinical history.  Avoid the following potential triggers: alcohol, tight clothing, NSAIDs, hot showers and getting overheated. Get bloodwork to rule out other etiologies.   If you break out the next time - let us know.  Start zyrtec (cetirizine) 53m twice a day. If symptoms are not controlled or causes drowsiness let [facility]. Start Pepcid (famotidine) 230mtwice a day.   Other allergic rhinitis Some rhino conjunctivitis symptoms in the spring. Takes Flonase and zyrtec prn with good benefit. No prior testing. Patient prefers to get bloodwork for this.  See below for environmental control measures. Use over the counter antihistamines such as Zyrtec (cetirizine), Claritin (loratadine), Allegra (fexofenadine), or Xyzal (levocetirizine) daily as needed. May take twice a day during allergy flares. May switch antihistamines every few months. Use Flonase (fluticasone) nasal spray 1 spray per nostril twice a day as needed for nasal congestion.   Food intolerance Avoiding gluten and limiting corn as it causes bloating and constipation. Discussed with patient that skin prick testing and bloodwork (food IgE levels) check for IgE mediated reactions which her clinical presentation does not support.  Avoid gluten and corn as it gives you issues.  History of asthma Asthma as a child. Denies symptoms and no inhaler use recently. Monitor symptoms.  Local reaction to hymenoptera sting Localized reactions as a child.  Continue to avoid.   Return if symptoms worsen or  fail to improve.  No orders of the defined types were placed in this encounter.  Lab Orders         Allergens w/Total IgE Area 2         Alpha-Gal Panel         ANA w/Reflex         C3 and C4         CBC with Differential/Platelet          C-reactive protein         Sedimentation rate         Tryptase      Other allergy screening: Asthma: no As a child.  Rhino conjunctivitis: yes Itchy throat, ears, nasal congestion, watery eyes, sinus pressure. Takes Flonase and zyrtec in the spring. No prior allergy testing.   Food allergy:  Patient has been avoiding gluten and limiting corn which helped with bloating and constipation.   Medication allergy: no Hymenoptera allergy: no Apparently she was told she is allergic as a child but not sure of exact reactions besides localized.  History of recurrent infections suggestive of immunodeficency: no  Diagnostics: None.   Past Medical History: Patient Active Problem List   Diagnosis Date Noted   Rash and other nonspecific skin eruption 09/08/2022   Other allergic rhinitis 09/08/2022   Local reaction to hymenoptera sting 09/08/2022   History of asthma 09/08/2022   Food intolerance 09/08/2022   Hypertension associated with diabetes ([place]) 03/01/2020   Back pain 05/13/2019   Stress 01/14/2018   Hepatocellular dysfunction 01/08/2018   Anemia, iron deficiency 01/03/2015   Vitamin B 12 deficiency 03/09/2014   Dyslipidemia associated with type 2 diabetes mellitus ([place]) 02/10/2014   Vitamin D deficiency 02/10/2014   T2DM (type 2 diabetes mellitus) ([place]) 11/11/2012   Past Medical History:  Diagnosis Date   Allergy    Anemia    hx of   Anxiety    hx of   Bipolar disorder ([place])    Collapsed lung    "spontaneous" , teenager   Constipation    Depression    hx of   Diabetes mellitus without complication ([place])    on meds   Dyslipidemia associated with type 2 diabetes mellitus ([place]) 02/10/2014   Glaucoma    Heart murmur    HSV-1 (herpes simplex virus 1) infection    Hypertension    on meds   Kidney stones    Meningitis    Scoliosis (and kyphoscoliosis), idiopathic    Past Surgical History: Past Surgical History:  Procedure Laterality Date   CHEST TUBE INSERTION      TYMPANOSTOMY TUBE PLACEMENT     Medication List:  Current Outpatient Medications  Medication Sig Dispense Refill   blood glucose meter kit and supplies KIT Dispense based on patient and insurance preference. Use up to four times daily as directed. (FOR ICD-9 250.00, 250.01). 1 each 0   Cyanocobalamin (VITAMIN B-12 PO) Take 1,200 mcg by mouth. Pt only taking twice a week     glucose blood (ONETOUCH VERIO) test strip Use as instructed to check blood sugar up to 3 times daily 300 each 0   Magnesium 250 MG TABS Take 1 tablet by mouth daily at 2 PM.     METAMUCIL FIBER PO Take by mouth.     OneTouch Delica Lancets 99991111 MISC Use daily as needed to check blood sugar. 100 each 2   No current facility-administered medications for this visit.   Allergies: Allergies  Allergen  Reactions   Tetanus Toxoids Anaphylaxis    tetanus   Ceclor [Cefaclor] Hives   Codeine Hives   Doxycycline Other (See Comments)    Hives   Penicillins Hives   Sulfa Antibiotics Hives   Lisinopril Other (See Comments)    fatigue   Social History: Social History   Socioeconomic History   Marital status: Single    Spouse name: Not on file   Number of children: 2   Years of education: Not on file   Highest education level: Not on file  Occupational History   Occupation: Hydrologist: [employer]    Comment: [employer]  Tobacco Use   Smoking status: Never   Smokeless tobacco: Never  Vaping Use   Vaping Use: Never used  Substance and Sexual Activity   Alcohol use: No    Alcohol/week: 0.0 standard drinks of alcohol   Drug use: No   Sexual activity: Never  Other Topics Concern   Not on file  Social History Narrative   She lives with her grown son. 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   Social Determinants of Health   Financial Resource Strain: Not on file  Food Insecurity: Not on file  Transportation Needs: Not on file  Physical Activity: Not on file  Stress: Not on file  Social  Connections: Not on file   Lives in an 51 year old apartment. Smoking: denies Occupation: clerical - HR  Environmental History: Water Damage/mildew in the house: yes Carpet in the family room: no Carpet in the bedroom: no Heating: electric Cooling: window Pet: no  Family History: Family History  Problem Relation Age of Onset   Hyperlipidemia Mother    Hypertension Mother    Hyperlipidemia Father    Hypertension Father    CAD Father    Heart disease Father    Other Brother        colostomy    Alzheimer's disease Maternal Grandmother 50   Colon cancer Neg Hx    Colon polyps Neg Hx    Esophageal cancer Neg Hx    Rectal cancer Neg Hx    Stomach cancer Neg Hx    Pancreatic cancer Neg Hx    Problem                               Relation Asthma                                   no Eczema                                no Food allergy                          no Allergic rhino conjunctivitis     no  Review of Systems  Constitutional:  Negative for appetite change, chills, fever and unexpected weight change.  HENT:  Negative for congestion and rhinorrhea.   Eyes:  Negative for itching.  Respiratory:  Negative for cough, chest tightness, shortness of breath and wheezing.   Cardiovascular:  Negative for chest pain.  Gastrointestinal:  Negative for abdominal pain.  Genitourinary:  Negative for difficulty urinating.  Skin:  Negative for rash.  Neurological:  Negative for headaches.  Objective: BP 120/72   Pulse 92   Temp 98.1 F (36.7 C)   Resp 20   Ht 5' 4"$  (1.626 m)   Wt 138 lb 6.4 oz (62.8 kg)   SpO2 99%   BMI 23.76 kg/m  Body mass index is 23.76 kg/m. Physical Exam Vitals and nursing note reviewed.  Constitutional:      Appearance: Normal appearance. She is well-developed.  HENT:     Head: Normocephalic and atraumatic.     Right Ear: Tympanic membrane and external ear normal.     Left Ear: Tympanic membrane and external ear normal.     Nose: Nose  normal.     Mouth/Throat:     Mouth: Mucous membranes are moist.     Pharynx: Oropharynx is clear.  Eyes:     Conjunctiva/sclera: Conjunctivae normal.  Cardiovascular:     Rate and Rhythm: Normal rate and regular rhythm.     Heart sounds: Normal heart sounds. No murmur heard.    No friction rub. No gallop.  Pulmonary:     Effort: Pulmonary effort is normal.     Breath sounds: Normal breath sounds. No wheezing, rhonchi or rales.  Musculoskeletal:     Cervical back: Neck supple.  Skin:    General: Skin is warm.     Findings: No rash.  Neurological:     Mental Status: She is alert and oriented to person, place, and time.  Psychiatric:        Behavior: Behavior normal.   The plan was reviewed with the patient/family, and all questions/concerned were addressed.  It was my pleasure to see Selena Holt today and participate in her care. Please feel free to contact me with any questions or concerns.  Sincerely,  Rexene Alberts, DO Allergy & Immunology  [facility] office: [place] office: [phone]

## 2022-09-08 NOTE — Assessment & Plan Note (Signed)
Avoiding gluten and limiting corn as it causes bloating and constipation. Discussed with patient that skin prick testing and bloodwork (food IgE levels) check for IgE mediated reactions which her clinical presentation does not support.  Avoid gluten and corn as it gives you issues.

## 2022-09-08 NOTE — Assessment & Plan Note (Signed)
Some rhino conjunctivitis symptoms in the spring. Takes Flonase and zyrtec prn with good benefit. No prior testing. Patient prefers to get bloodwork for this.  See below for environmental control measures. Use over the counter antihistamines such as Zyrtec (cetirizine), Claritin (loratadine), Allegra (fexofenadine), or Xyzal (levocetirizine) daily as needed. May take twice a day during allergy flares. May switch antihistamines every few months. Use Flonase (fluticasone) nasal spray 1 spray per nostril twice a day as needed for nasal congestion.

## 2022-09-08 NOTE — Assessment & Plan Note (Signed)
Localized reactions as a child.  Continue to avoid.

## 2022-09-11 LAB — ALLERGENS W/TOTAL IGE AREA 2
Alternaria Alternata IgE: <0.1 kU/L
Aspergillus Fumigatus IgE: <0.1 kU/L
Bermuda Grass IgE: <0.1 kU/L
Cat Dander IgE: <0.1 kU/L
Cedar, Mountain IgE: <0.1 kU/L
Cladosporium Herbarum IgE: <0.1 kU/L
Cockroach, German IgE: <0.1 kU/L
Common Silver Birch IgE: <0.1 kU/L
Cottonwood IgE: <0.1 kU/L
D Farinae IgE: 0.83 kU/L — AB
D Pteronyssinus IgE: 0.83 kU/L — AB
Dog Dander IgE: <0.1 kU/L
Elm, American IgE: <0.1 kU/L
IgE (Immunoglobulin E), Serum: 8 IU/mL (ref 6–495)
Johnson Grass IgE: 0.12 kU/L — AB
Maple/Box Elder IgE: <0.1 kU/L
Mouse Urine IgE: <0.1 kU/L
Oak, White IgE: <0.1 kU/L
Pecan, Hickory IgE: <0.1 kU/L
Penicillium Chrysogen IgE: <0.1 kU/L
Pigweed, Rough IgE: <0.1 kU/L
Ragweed, Short IgE: <0.1 kU/L
Sheep Sorrel IgE Qn: <0.1 kU/L
Timothy Grass IgE: 0.73 kU/L — AB
White Mulberry IgE: <0.1 kU/L

## 2022-09-11 LAB — CBC WITH DIFFERENTIAL/PLATELET
Basophils Absolute: 0 10*3/uL (ref 0.0–0.2)
Basos: 0 %
EOS (ABSOLUTE): 0.1 10*3/uL (ref 0.0–0.4)
Eos: 1 %
Hematocrit: 39.4 % (ref 34.0–46.6)
Hemoglobin: 13.2 g/dL (ref 11.1–15.9)
Immature Grans (Abs): 0 10*3/uL (ref 0.0–0.1)
Immature Granulocytes: 0 %
Lymphocytes Absolute: 2.4 10*3/uL (ref 0.7–3.1)
Lymphs: 23 %
MCH: 31.2 pg (ref 26.6–33.0)
MCHC: 33.5 g/dL (ref 31.5–35.7)
MCV: 93 fL (ref 79–97)
Monocytes Absolute: 0.6 10*3/uL (ref 0.1–0.9)
Monocytes: 5 %
Neutrophils Absolute: 7.4 10*3/uL — ABNORMAL HIGH (ref 1.4–7.0)
Neutrophils: 71 %
Platelets: 301 10*3/uL (ref 150–450)
RBC: 4.23 x10E6/uL (ref 3.77–5.28)
RDW: 12.4 % (ref 11.7–15.4)
WBC: 10.5 10*3/uL (ref 3.4–10.8)

## 2022-09-11 LAB — C3 AND C4
Complement C3, Serum: 155 mg/dL (ref 82–167)
Complement C4, Serum: 26 mg/dL (ref 12–38)

## 2022-09-11 LAB — SEDIMENTATION RATE: Sed Rate: 17 mm/hr (ref 0–40)

## 2022-09-11 LAB — C-REACTIVE PROTEIN: CRP: 7 mg/L (ref 0–10)

## 2022-09-11 LAB — ALPHA-GAL PANEL
Allergen Lamb IgE: <0.1 kU/L
Beef IgE: <0.1 kU/L
IgE (Immunoglobulin E), Serum: 10 IU/mL (ref 6–495)
O215-IgE Alpha-Gal: <0.1 kU/L
Pork IgE: <0.1 kU/L

## 2022-09-11 LAB — ANA W/REFLEX: Anti Nuclear Antibody (ANA): NEGATIVE

## 2022-09-11 LAB — TRYPTASE: Tryptase: 2.8 ug/L (ref 2.2–13.2)

## 2022-10-07 DIAGNOSIS — H02831 Dermatochalasis of right upper eyelid: Secondary | ICD-10-CM | POA: Diagnosis not present

## 2022-11-04 DIAGNOSIS — H40013 Open angle with borderline findings, low risk, bilateral: Secondary | ICD-10-CM | POA: Diagnosis not present

## 2022-11-04 LAB — HM DIABETES EYE EXAM

## 2022-11-06 ENCOUNTER — Encounter: Payer: Self-pay | Admitting: Family Medicine

## 2022-11-06 ENCOUNTER — Ambulatory Visit: Payer: Federal, State, Local not specified - PPO | Admitting: Family Medicine

## 2022-11-06 VITALS — BP 140/88 | HR 68 | Temp 98.2°F | Resp 16 | Ht 64.0 in | Wt 138.2 lb

## 2022-11-06 DIAGNOSIS — N951 Menopausal and female climacteric states: Secondary | ICD-10-CM | POA: Diagnosis not present

## 2022-11-06 DIAGNOSIS — F419 Anxiety disorder, unspecified: Secondary | ICD-10-CM | POA: Diagnosis not present

## 2022-11-06 MED ORDER — CITALOPRAM HYDROBROMIDE 10 MG PO TABS: 10.0000 mg | ORAL_TABLET | Freq: Every day | ORAL | 3 refills | Status: DC

## 2022-11-06 MED ORDER — CLOBETASOL PROPIONATE 0.05 % EX OINT: 1.0000 | TOPICAL_OINTMENT | Freq: Two times a day (BID) | CUTANEOUS | 0 refills | Status: DC

## 2022-11-06 NOTE — Progress Notes (Signed)
Selena Holt is a 52 y.o. female who presents today for an office visit.  Assessment/Plan:  New/Acute Problems: Skin Lesion  Appearance consistent with possible psoriasis plaque.  Will try topical clobetasol for the next 1 to 2 weeks.  She will let me know if not improving and would consider referral to dermatology versus biopsy at that time.  Chronic Problems Addressed Today: Anxiety Had lengthy discussion with patient regarding her irritability.  She is in a period menopausal phase and likely has quite a few hormonal fluctuations which is contributing as well though she does have some baseline generalized anxiety that is likely worsened recently.  She has been seeing her therapist monthly and will continue doing this.  This has been very beneficial for her.  We did discuss potential medications as well.  She did ask about as needed medications however we discussed that they are typically not as effective for the symptoms she is currently experiencing.  I believe she should do well with SSRI.  Will start low-dose Celexa 10 mg daily.  Discussed potential side effects.  She can follow-up with me in a couple of weeks and we can titrate the dose as needed.  We will update her FMLA and workplace accommodation forms as well.  Hopefully this will help with some of her work stress.  Menopausal symptoms Patient currently perimenopausal though is having some vasomotor symptoms including hot flashes and sweats as well as mood swings as above.  She did briefly ask about hormone replacement.  This may be a reasonable option at some point though we should work on trying to get her mood better controlled first.  Will be starting Celexa.  Hopefully this will help some with her vasomotor symptoms as well.  She can discuss hormone replacement with gynecologist at upcoming visit later this year.     Subjective:  HPI:  See A/p for status of chronic conditions.  Her main concern today is anxiety. She has  a lot of work place stress. She has notice that she has been more irritable.  Noises are very distracting to her and make it difficult for her to get back on task.  She notes she has a coworker that is constantly talking or humming which was a big source of irritation for her.  Her symptoms have gotten a lot worse the last month or so since her last menstrual period. She had gone almost a year since her last period.  She will be seeing her gynecologist later this year.  She does have a therapist and has been seeing them once monthly.  She has been having some hot flashes and other menopausal symptoms until she got her period about a month ago.  She also has a skin lesion on her posterior thigh.  This has been there for at least last 6 to 9 months.  Is become more irritated recently.  No treatments tried.        Objective:  Physical Exam: BP (!) 140/88   Pulse 68   Temp 98.2 F (36.8 C) (Temporal)   Resp 16   Ht  (1.626 m)   Wt 138 lb 3.2 oz (62.7 kg)   SpO2 100%   BMI 23.72 kg/m   Gen: No acute distress, resting comfortably CV: Regular rate and rhythm with no murmurs appreciated Pulm: Normal work of breathing, clear to auscultation bilaterally with no crackles, wheezes, or rhonchi Skin: Erythematous plaque approximately 1 cm in diameter on posterior thigh with overlying  silvery scale. Neuro: Grossly normal, moves all extremities Psych: Normal affect and thought content      Kalah Pflum M. [name], MD 11/06/2022 12:47 PM

## 2022-11-06 NOTE — Patient Instructions (Signed)
It was very nice to see you today!  Please try the Celexa 10 mg daily.  Send me a message in a few weeks to let me know how this is working for you.  We will complete your forms and let you know when they are ready.  Please try the clobetasol to the spot on your leg and let us know if not improving in the next few weeks.  Take care, Dr [name]  PLEASE NOTE:  If you had any lab tests, please let us know if you have not heard back within a few days. You may see your results on mychart before we have a chance to review them but we will give you a call once they are reviewed by [place].   If we ordered any referrals today, please let us know if you have not heard from their office within the next week.   If you had any urgent prescriptions sent in today, please check with the pharmacy within an hour of our visit to make sure the prescription was transmitted appropriately.   Please try these tips to maintain a healthy lifestyle:  Eat at least 3 REAL meals and 1-2 snacks per day.  Aim for no more than 5 hours between eating.  If you eat breakfast, please do so within one hour of getting up.   Each meal should contain half fruits/vegetables, one quarter protein, and one quarter carbs (no bigger than a computer mouse)  Cut down on sweet beverages. This includes juice, soda, and sweet tea.   Drink at least 1 glass of water with each meal and aim for at least 8 glasses per day  Exercise at least 150 minutes every week.

## 2022-11-06 NOTE — Assessment & Plan Note (Signed)
Patient currently perimenopausal though is having some vasomotor symptoms including hot flashes and sweats as well as mood swings as above.  She did briefly ask about hormone replacement.  This may be a reasonable option at some point though we should work on trying to get her mood better controlled first.  Will be starting Celexa.  Hopefully this will help some with her vasomotor symptoms as well.  She can discuss hormone replacement with gynecologist at upcoming visit later this year.

## 2022-11-06 NOTE — Assessment & Plan Note (Addendum)
Had lengthy discussion with patient regarding her irritability.  She is in a period menopausal phase and likely has quite a few hormonal fluctuations which is contributing as well though she does have some baseline generalized anxiety that is likely worsened recently.  She has been seeing her therapist monthly and will continue doing this.  This has been very beneficial for her.  We did discuss potential medications as well.  She did ask about as needed medications however we discussed that they are typically not as effective for the symptoms she is currently experiencing.  I believe she should do well with SSRI.  Will start low-dose Celexa 10 mg daily.  Discussed potential side effects.  She can follow-up with me in a couple of weeks and we can titrate the dose as needed.  We will update her FMLA and workplace accommodation forms as well.  Hopefully this will help with some of her work stress.

## 2022-11-10 ENCOUNTER — Ambulatory Visit: Payer: Federal, State, Local not specified - PPO | Admitting: Family Medicine

## 2022-11-12 ENCOUNTER — Encounter: Payer: Self-pay | Admitting: Family Medicine

## 2022-11-12 NOTE — Telephone Encounter (Signed)
FMLA was printed and placed in PCP office to be reviewed

## 2022-11-13 DIAGNOSIS — F4310 Post-traumatic stress disorder, unspecified: Secondary | ICD-10-CM | POA: Diagnosis not present

## 2022-11-14 DIAGNOSIS — S0501XA Injury of conjunctiva and corneal abrasion without foreign body, right eye, initial encounter: Secondary | ICD-10-CM | POA: Diagnosis not present

## 2022-11-18 ENCOUNTER — Encounter: Payer: Self-pay | Admitting: Family Medicine

## 2022-11-18 NOTE — Telephone Encounter (Signed)
Spoke with pt to let her know that her forms are ready for pick up. Pt stated that she will be here today 11/18/2022 around 4:30. Forms will be placed up front at check in.

## 2022-11-20 DIAGNOSIS — H16141 Punctate keratitis, right eye: Secondary | ICD-10-CM | POA: Diagnosis not present

## 2022-11-24 DIAGNOSIS — F4310 Post-traumatic stress disorder, unspecified: Secondary | ICD-10-CM | POA: Diagnosis not present

## 2022-12-01 ENCOUNTER — Ambulatory Visit: Payer: Federal, State, Local not specified - PPO | Admitting: Family Medicine

## 2022-12-01 ENCOUNTER — Encounter: Payer: Self-pay | Admitting: Family Medicine

## 2022-12-01 VITALS — BP 147/81 | HR 65 | Temp 97.3°F | Ht 64.0 in | Wt 143.4 lb

## 2022-12-01 DIAGNOSIS — E1159 Type 2 diabetes mellitus with other circulatory complications: Secondary | ICD-10-CM

## 2022-12-01 DIAGNOSIS — N951 Menopausal and female climacteric states: Secondary | ICD-10-CM

## 2022-12-01 DIAGNOSIS — I152 Hypertension secondary to endocrine disorders: Secondary | ICD-10-CM

## 2022-12-01 DIAGNOSIS — F419 Anxiety disorder, unspecified: Secondary | ICD-10-CM | POA: Diagnosis not present

## 2022-12-01 DIAGNOSIS — R1012 Left upper quadrant pain: Secondary | ICD-10-CM | POA: Diagnosis not present

## 2022-12-01 DIAGNOSIS — F4310 Post-traumatic stress disorder, unspecified: Secondary | ICD-10-CM | POA: Diagnosis not present

## 2022-12-01 DIAGNOSIS — R6889 Other general symptoms and signs: Secondary | ICD-10-CM | POA: Diagnosis not present

## 2022-12-01 LAB — CBC
HCT: 36.4 % (ref 36.0–46.0)
Hemoglobin: 12.5 g/dL (ref 12.0–15.0)
MCHC: 34.4 g/dL (ref 30.0–36.0)
MCV: 91.3 fl (ref 78.0–100.0)
Platelets: 271 10*3/uL (ref 150.0–400.0)
RBC: 3.98 Mil/uL (ref 3.87–5.11)
RDW: 13.2 % (ref 11.5–15.5)
WBC: 13 10*3/uL — ABNORMAL HIGH (ref 4.0–10.5)

## 2022-12-01 LAB — COMPREHENSIVE METABOLIC PANEL
ALT: 31 U/L (ref 0–35)
AST: 21 U/L (ref 0–37)
Albumin: 4.4 g/dL (ref 3.5–5.2)
Alkaline Phosphatase: 90 U/L (ref 39–117)
BUN: 9 mg/dL (ref 6–23)
CO2: 27 mEq/L (ref 19–32)
Calcium: 9.1 mg/dL (ref 8.4–10.5)
Chloride: 99 mEq/L (ref 96–112)
Creatinine, Ser: 0.63 mg/dL (ref 0.40–1.20)
GFR: 103.46 mL/min (ref >60.00)
Glucose, Bld: 137 mg/dL — ABNORMAL HIGH (ref 70–99)
Potassium: 4 mEq/L (ref 3.5–5.1)
Sodium: 134 mEq/L — ABNORMAL LOW (ref 135–145)
Total Bilirubin: 0.6 mg/dL (ref 0.2–1.2)
Total Protein: 6.9 g/dL (ref 6.0–8.3)

## 2022-12-01 LAB — LIPASE: Lipase: 31 U/L (ref 11.0–59.0)

## 2022-12-01 LAB — TSH: TSH: 3.19 u[IU]/mL (ref 0.35–5.50)

## 2022-12-01 LAB — VITAMIN B12: Vitamin B-12: 955 pg/mL — ABNORMAL HIGH (ref 211–911)

## 2022-12-01 MED ORDER — SIMETHICONE 125 MG PO CHEW: 125.0000 mg | CHEWABLE_TABLET | Freq: Four times a day (QID) | ORAL | 0 refills | Status: DC | PRN

## 2022-12-01 MED ORDER — PANTOPRAZOLE SODIUM 40 MG PO TBEC: 40.0000 mg | DELAYED_RELEASE_TABLET | Freq: Every day | ORAL | 3 refills | Status: DC

## 2022-12-01 NOTE — Assessment & Plan Note (Signed)
Elevated today though typically has been well-controlled.  She will monitor at home and let us know if persistently elevated.

## 2022-12-01 NOTE — Patient Instructions (Signed)
It was very nice to see you today!  Will check labs today.  Please try the Protonix and simethicone.  Let me know if not improving in the next couple of weeks.  Please let us know if you decide to start the Celexa.  No follow-ups on file.   Take care, Dr [name]  PLEASE NOTE:  If you had any lab tests, please let us know if you have not heard back within a few days. You may see your results on mychart before we have a chance to review them but we will give you a call once they are reviewed by [place].   If we ordered any referrals today, please let us know if you have not heard from their office within the next week.   If you had any urgent prescriptions sent in today, please check with the pharmacy within an hour of our visit to make sure the prescription was transmitted appropriately.   Please try these tips to maintain a healthy lifestyle:  Eat at least 3 REAL meals and 1-2 snacks per day.  Aim for no more than 5 hours between eating.  If you eat breakfast, please do so within one hour of getting up.   Each meal should contain half fruits/vegetables, one quarter protein, and one quarter carbs (no bigger than a computer mouse)  Cut down on sweet beverages. This includes juice, soda, and sweet tea.   Drink at least 1 glass of water with each meal and aim for at least 8 glasses per day  Exercise at least 150 minutes every week.

## 2022-12-01 NOTE — Assessment & Plan Note (Signed)
Anxiety is doing much better.  She did not take the Celexa.  She has been working with a therapist which seems to be going well.  Given that she has had great benefit with therapy so far it is okay for her to stay off medications at this point.  She can follow-up with me in a few weeks via MyChart if symptoms do not continue to improve.

## 2022-12-01 NOTE — Progress Notes (Signed)
   Selena Holt is a 52 y.o. female who presents today for an office visit.  Assessment/Plan:  New/Acute Problems: Temperature Indifference  No red flags.  Reassured patient.  Check labs especially in light of her recent history of iron deficiency anemia.  She is also going through perimenopausal changes could be contributing as well.  If labs are normal we will continue with watchful waiting  Left upper quadrant pain Reassuring exam.  Potentially gas pains.  Could also have some gastritis.  Will check labs today including c-Met and lipase.  Will empirically start Protonix 40 mg daily and simethicone 125 mg twice daily for the next 1 to 2 weeks.  If labs are normal and symptoms persist would consider CT scan versus referral to GI.  Chronic Problems Addressed Today: Anxiety Anxiety is doing much better.  She did not take the Celexa.  She has been working with a therapist which seems to be going well.  Given that she has had great benefit with therapy so far it is okay for her to stay off medications at this point.  She can follow-up with me in a few weeks via MyChart if symptoms do not continue to improve.  Hypertension associated with diabetes (HCC) Elevated today though typically has been well-controlled.  She will monitor at home and let us know if persistently elevated.     Subjective:  HPI:  See Assessment / plan for status of chronic conditions.  She was last here about a month ago. At her last visit we discussed her anxiety.  She has been seeing a therapist for this.  Her mood has been improving.  We did prescribe Celexa at her last visit however she did not end up starting this.  She has 2 concerns that she would like to discuss today:  She is concerned about her body's ability to regulate his temperature.  Her last several weeks she has noticed that hot temperatures do not seem to bother her.  States that she will be sitting in her apartment or other places with temperatures  into the mid 80s or higher noticed that it is uncomfortable until friends or coworkers tell her.  She is able to tell when it is cold or chilly outside.  She has also been having some left upper quadrant of abdomen. This has been going on for about 10 days. She has noticed more swelling on left side. Sometimes gets worse after eating. Sometimes some nausea. No vomiting. Some pain in left shoulder. Pain is consistent. No hearburn or reflux. She has been burping more.        Objective:  Physical Exam: BP (!) 147/81   Pulse 65   Temp (!) 97.3 F (36.3 C) (Temporal)   Ht 5\' 4"  (1.626 m)   Wt 143 lb 6.4 oz (65 kg)   SpO2 100%   BMI 24.61 kg/m   Gen: No acute distress, resting comfortably CV: Regular rate and rhythm with no murmurs appreciated Pulm: Normal work of breathing, clear to auscultation bilaterally with no crackles, wheezes, or rhonchi Abdomen: Bowel sounds present, soft, nontender, nondistended.  Murphy sign negative.  No rebound or guarding. Neuro: Grossly normal, moves all extremities Psych: Normal affect and thought content      Shayann Garbutt M. [name], MD 12/01/2022 1:42 PM

## 2022-12-02 NOTE — Progress Notes (Signed)
Her white blood cell count is elevated but everything else is stable.  No clear cause of her temperature tolerance issues however we did need to recheck her white blood cell count.  Please place future order for CBC with differential and peripheral smear.  She can come back this week or early next week to have this done.

## 2022-12-03 ENCOUNTER — Other Ambulatory Visit: Payer: Self-pay

## 2022-12-03 DIAGNOSIS — D72829 Elevated white blood cell count, unspecified: Secondary | ICD-10-CM

## 2022-12-09 ENCOUNTER — Other Ambulatory Visit: Payer: Self-pay

## 2022-12-09 ENCOUNTER — Other Ambulatory Visit (INDEPENDENT_AMBULATORY_CARE_PROVIDER_SITE_OTHER): Payer: Federal, State, Local not specified - PPO

## 2022-12-09 DIAGNOSIS — R300 Dysuria: Secondary | ICD-10-CM

## 2022-12-09 DIAGNOSIS — D72829 Elevated white blood cell count, unspecified: Secondary | ICD-10-CM

## 2022-12-10 LAB — CBC WITH DIFFERENTIAL/PLATELET
Basophils Absolute: 0.1 10*3/uL (ref 0.0–0.1)
Basophils Relative: 0.8 % (ref 0.0–3.0)
Eosinophils Absolute: 0.1 10*3/uL (ref 0.0–0.7)
Eosinophils Relative: 0.9 % (ref 0.0–5.0)
HCT: 37.1 % (ref 36.0–46.0)
Hemoglobin: 12.4 g/dL (ref 12.0–15.0)
Lymphocytes Relative: 31.1 % (ref 12.0–46.0)
Lymphs Abs: 2.7 10*3/uL (ref 0.7–4.0)
MCHC: 33.6 g/dL (ref 30.0–36.0)
MCV: 91.8 fl (ref 78.0–100.0)
Monocytes Absolute: 0.2 10*3/uL (ref 0.1–1.0)
Monocytes Relative: 2.4 % — ABNORMAL LOW (ref 3.0–12.0)
Neutro Abs: 5.7 10*3/uL (ref 1.4–7.7)
Neutrophils Relative %: 64.8 % (ref 43.0–77.0)
Platelets: 257 10*3/uL (ref 150.0–400.0)
RBC: 4.04 Mil/uL (ref 3.87–5.11)
RDW: 13.3 % (ref 11.5–15.5)
WBC: 8.7 10*3/uL (ref 4.0–10.5)

## 2022-12-10 LAB — URINALYSIS, ROUTINE W REFLEX MICROSCOPIC
Bilirubin Urine: NEGATIVE
Hgb urine dipstick: NEGATIVE
Ketones, ur: NEGATIVE
Leukocytes,Ua: NEGATIVE
Nitrite: NEGATIVE
Specific Gravity, Urine: <=1.005 — AB (ref 1.000–1.030)
Total Protein, Urine: NEGATIVE
Urine Glucose: NEGATIVE
Urobilinogen, UA: 0.2 (ref 0.0–1.0)
pH: 6 (ref 5.0–8.0)

## 2022-12-12 NOTE — Progress Notes (Signed)
Her white blood cell count is back to normal.  She may have had a mild viral infection that caused it to be elevated .  Now that is back to normal we do not need to do any further testing however she should let us know if she has any change in her symptoms.

## 2022-12-15 LAB — URINE CULTURE

## 2022-12-15 LAB — PATHOLOGIST SMEAR REVIEW

## 2022-12-16 NOTE — Progress Notes (Signed)
The lab canceled her urine culture. Recommend she come back in for repeat sample if she is still having symptoms.  Selena Holt. [name], MD 12/16/2022 12:09 PM

## 2022-12-17 ENCOUNTER — Encounter: Payer: Self-pay | Admitting: Family Medicine

## 2022-12-17 DIAGNOSIS — F4310 Post-traumatic stress disorder, unspecified: Secondary | ICD-10-CM | POA: Diagnosis not present

## 2022-12-17 DIAGNOSIS — R6881 Early satiety: Secondary | ICD-10-CM | POA: Diagnosis not present

## 2022-12-17 DIAGNOSIS — N926 Irregular menstruation, unspecified: Secondary | ICD-10-CM | POA: Diagnosis not present

## 2022-12-17 DIAGNOSIS — N760 Acute vaginitis: Secondary | ICD-10-CM | POA: Diagnosis not present

## 2022-12-17 DIAGNOSIS — R350 Frequency of micturition: Secondary | ICD-10-CM | POA: Diagnosis not present

## 2022-12-23 DIAGNOSIS — F4310 Post-traumatic stress disorder, unspecified: Secondary | ICD-10-CM | POA: Diagnosis not present

## 2023-01-07 DIAGNOSIS — N852 Hypertrophy of uterus: Secondary | ICD-10-CM | POA: Diagnosis not present

## 2023-01-07 DIAGNOSIS — N898 Other specified noninflammatory disorders of vagina: Secondary | ICD-10-CM | POA: Diagnosis not present

## 2023-01-07 DIAGNOSIS — F4310 Post-traumatic stress disorder, unspecified: Secondary | ICD-10-CM | POA: Diagnosis not present

## 2023-01-13 ENCOUNTER — Other Ambulatory Visit: Payer: Self-pay | Admitting: Obstetrics and Gynecology

## 2023-01-13 DIAGNOSIS — R921 Mammographic calcification found on diagnostic imaging of breast: Secondary | ICD-10-CM

## 2023-01-14 DIAGNOSIS — F4310 Post-traumatic stress disorder, unspecified: Secondary | ICD-10-CM | POA: Diagnosis not present

## 2023-01-20 DIAGNOSIS — F4310 Post-traumatic stress disorder, unspecified: Secondary | ICD-10-CM | POA: Diagnosis not present

## 2023-01-28 DIAGNOSIS — F4310 Post-traumatic stress disorder, unspecified: Secondary | ICD-10-CM | POA: Diagnosis not present

## 2023-02-04 ENCOUNTER — Ambulatory Visit
Admission: RE | Admit: 2023-02-04 | Discharge: 2023-02-04 | Disposition: A | Discharge status: Home / Self Care | Payer: Federal, State, Local not specified - PPO | Source: Ambulatory Visit | Attending: Obstetrics and Gynecology | Admitting: Obstetrics and Gynecology

## 2023-02-04 DIAGNOSIS — R921 Mammographic calcification found on diagnostic imaging of breast: Secondary | ICD-10-CM

## 2023-02-04 DIAGNOSIS — F4310 Post-traumatic stress disorder, unspecified: Secondary | ICD-10-CM | POA: Diagnosis not present

## 2023-02-17 DIAGNOSIS — F4310 Post-traumatic stress disorder, unspecified: Secondary | ICD-10-CM | POA: Diagnosis not present

## 2023-02-24 DIAGNOSIS — F4310 Post-traumatic stress disorder, unspecified: Secondary | ICD-10-CM | POA: Diagnosis not present

## 2023-02-25 ENCOUNTER — Other Ambulatory Visit: Payer: Self-pay | Admitting: Family Medicine

## 2023-03-06 DIAGNOSIS — H5201 Hypermetropia, right eye: Secondary | ICD-10-CM | POA: Diagnosis not present

## 2023-03-09 ENCOUNTER — Encounter: Payer: Self-pay | Admitting: Family Medicine

## 2023-03-10 DIAGNOSIS — F4310 Post-traumatic stress disorder, unspecified: Secondary | ICD-10-CM | POA: Diagnosis not present

## 2023-03-19 DIAGNOSIS — F4310 Post-traumatic stress disorder, unspecified: Secondary | ICD-10-CM | POA: Diagnosis not present

## 2023-04-01 DIAGNOSIS — F4310 Post-traumatic stress disorder, unspecified: Secondary | ICD-10-CM | POA: Diagnosis not present

## 2023-04-24 ENCOUNTER — Other Ambulatory Visit: Payer: Self-pay | Admitting: Family Medicine

## 2023-04-24 DIAGNOSIS — Z1211 Encounter for screening for malignant neoplasm of colon: Secondary | ICD-10-CM

## 2023-04-24 DIAGNOSIS — Z1212 Encounter for screening for malignant neoplasm of rectum: Secondary | ICD-10-CM

## 2023-04-30 DIAGNOSIS — F4310 Post-traumatic stress disorder, unspecified: Secondary | ICD-10-CM | POA: Diagnosis not present

## 2023-05-07 ENCOUNTER — Ambulatory Visit: Payer: Federal, State, Local not specified - PPO | Admitting: Family Medicine

## 2023-05-22 ENCOUNTER — Encounter: Payer: Self-pay | Admitting: Family Medicine

## 2023-05-22 ENCOUNTER — Other Ambulatory Visit: Payer: Self-pay | Admitting: Nurse Practitioner

## 2023-05-22 ENCOUNTER — Ambulatory Visit: Payer: Federal, State, Local not specified - PPO | Admitting: Family Medicine

## 2023-05-22 ENCOUNTER — Encounter (INDEPENDENT_AMBULATORY_CARE_PROVIDER_SITE_OTHER): Payer: Self-pay | Admitting: Otolaryngology

## 2023-05-22 ENCOUNTER — Telehealth (HOSPITAL_BASED_OUTPATIENT_CLINIC_OR_DEPARTMENT_OTHER): Payer: Self-pay | Admitting: Cardiology

## 2023-05-22 VITALS — BP 129/86 | HR 62 | Temp 98.0°F | Ht 64.0 in | Wt 146.8 lb

## 2023-05-22 DIAGNOSIS — R748 Abnormal levels of other serum enzymes: Secondary | ICD-10-CM

## 2023-05-22 DIAGNOSIS — I6529 Occlusion and stenosis of unspecified carotid artery: Secondary | ICD-10-CM | POA: Insufficient documentation

## 2023-05-22 DIAGNOSIS — K760 Fatty (change of) liver, not elsewhere classified: Secondary | ICD-10-CM | POA: Diagnosis not present

## 2023-05-22 DIAGNOSIS — E119 Type 2 diabetes mellitus without complications: Secondary | ICD-10-CM

## 2023-05-22 DIAGNOSIS — R000 Tachycardia, unspecified: Secondary | ICD-10-CM | POA: Diagnosis not present

## 2023-05-22 DIAGNOSIS — H93A9 Pulsatile tinnitus, unspecified ear: Secondary | ICD-10-CM | POA: Diagnosis not present

## 2023-05-22 DIAGNOSIS — I6523 Occlusion and stenosis of bilateral carotid arteries: Secondary | ICD-10-CM | POA: Diagnosis not present

## 2023-05-22 NOTE — Assessment & Plan Note (Addendum)
Had extensive discussion today regarding her diagnosis of carotid artery stenosis.  She was diagnosed with bilateral carotid artery stenosis 2 years ago by cardiology.  She was recommended to start statin and baby aspirin however she was reluctant to either of these and did not start either.  She also has not had any follow-up with cardiology since her diagnosis 2 years ago.  Patient is still reluctant start a statin. In light of her recent hepatitis issues would be reasonable for [place] to deferral this for now however we did discuss importance of aggressive risk factor modification.  She is agreeable to start baby aspirin 81 mg daily.  Concerned that her stenosis has progressed now that she is getting pulsatile tinnitus.  We will reassess today with MR angiogram head and neck however we do need to have her establish care with a cardiologist to assist with lipid management as well as complete her cardiac workup.  Will place referral today.  She will come back next month for her physical and we can recheck lipids at that time.  As above we did discuss need for aggressive risk factor modification at this point.  May be a reasonable candidate for Repatha or other statin alternative given her issues with idiopathic hepatitis in the past.

## 2023-05-22 NOTE — Assessment & Plan Note (Signed)
Heart rate well-controlled today.  Will refer for her to see cardiology again as her previous cardiologist office closed.

## 2023-05-22 NOTE — Assessment & Plan Note (Signed)
She has no longer on metformin.  She will come back soon for physical we will check A1c at that time.

## 2023-05-22 NOTE — Patient Instructions (Signed)
It was very nice to see you today!  I think the swishing sound your hearing in your ear is coming from a blocked artery in your neck.  We will check a scan to look for any other possible causes and also reevaluate the blockage in your neck.  I will refer you to see the cardiologist and ear nose and throat doctor for further evaluation.  Please let us know if you do not hear anything about these referrals soon.  Please start taking baby aspirin.  Return for Annual Physical.   Take care, Dr [name]  PLEASE NOTE:  If you had any lab tests, please let us know if you have not heard back within a few days. You may see your results on mychart before we have a chance to review them but we will give you a call once they are reviewed by [place].   If we ordered any referrals today, please let us know if you have not heard from their office within the next week.   If you had any urgent prescriptions sent in today, please check with the pharmacy within an hour of our visit to make sure the prescription was transmitted appropriately.   Please try these tips to maintain a healthy lifestyle:  Eat at least 3 REAL meals and 1-2 snacks per day.  Aim for no more than 5 hours between eating.  If you eat breakfast, please do so within one hour of getting up.   Each meal should contain half fruits/vegetables, one quarter protein, and one quarter carbs (no bigger than a computer mouse)  Cut down on sweet beverages. This includes juice, soda, and sweet tea.   Drink at least 1 glass of water with each meal and aim for at least 8 glasses per day  Exercise at least 150 minutes every week.

## 2023-05-22 NOTE — Progress Notes (Signed)
Selena Holt is a 52 y.o. female who presents today for an office visit.  Assessment/Plan:  New/Acute Problems: Pulsatile tinnitus  Likely secondary to her carotid stenosis.  Had lengthy discussion with patient today regarding the workup that she received a couple of years ago that did demonstrate bilateral coronary artery stenosis-see below problem note.  We will further evaluate her pulsatile tinnitus with MRA head and neck as well as refer her to see ENT.  Chronic Problems Addressed Today: Carotid artery stenosis Had extensive discussion today regarding her diagnosis of carotid artery stenosis.  She was diagnosed with bilateral carotid artery stenosis 2 years ago by cardiology.  She was recommended to start statin and baby aspirin however she was reluctant to either of these and did not start either.  She also has not had any follow-up with cardiology since her diagnosis 2 years ago.  Patient is still reluctant start a statin. In light of her recent hepatitis issues would be reasonable for [place] to deferral this for now however we did discuss importance of aggressive risk factor modification.  She is agreeable to start baby aspirin 81 mg daily.  Concerned that her stenosis has progressed now that she is getting pulsatile tinnitus.  We will reassess today with MR angiogram head and neck however we do need to have her establish care with a cardiologist to assist with lipid management as well as complete her cardiac workup.  Will place referral today.  She will come back next month for her physical and we can recheck lipids at that time.  As above we did discuss need for aggressive risk factor modification at this point.  May be a reasonable candidate for Repatha or other statin alternative given her issues with idiopathic hepatitis in the past.  T2DM (type 2 diabetes mellitus) (HCC) She has no longer on metformin.  She will come back soon for physical we will check A1c at that  time.  Tachycardia Heart rate well-controlled today.  Will refer for her to see cardiology again as her previous cardiologist office closed.      Subjective:  HPI:  See Assessment / plan for status of chronic conditions.  Patient is here today with new onset swishing sensation in her ear.  This started about 3 months ago.  No obvious injuries or precipitating events however does notice that whenever she lays down on her right side she will hear a swishing sound in her ear that sounds like a heartbeat.  This has been persistent and stable for the last several months.  She has not had any other symptoms.  No ear pain.  No hearing loss.  No vertigo or dizziness.  No syncope.  No chest pain or shortness of breath.  No weakness or numbness.  No nausea or vomiting.  She was last seen here about 5 months ago.  At that time was overall doing well.  She was having some left upper quadrant pain and we treated with a course of Protonix and simethicone and symptoms had resolved.  Since our last visit she has followed with hepatology for her prior episode of idiopathic hepatitis.  She is doing well from that standpoint.  Patient also request to be referred to see cardiology.  She was referred to cardiology a couple of years ago for tachycardia and chest pain.  The cardiologist she saw a couple of years ago ordered CTA coronary scan, echocardiogram, and carotid duplex.  She was given a prescription for metoprolol to take  prior to her coronary scan.  Patient able to complete her carotid duplex and echocardiogram.  Her echocardiogram was normal however her carotid duplex did demonstrate bilateral internal carotid artery stenosis 50 to 69%.  They had recommended a 32-month follow-up.  Patient was recommended to start a statin at that time however she was reluctant to do this and subsequently never followed up with cardiology.  She was given a message that her cardiologist had close down and she request referral to  establish care with a new cardiologist.       Objective:  Physical Exam: BP 129/86 Comment: today at office visit  Pulse 62   Temp 98 F (36.7 C) (Temporal)   Ht 5\' 4"  (1.626 m)   Wt 146 lb 12.8 oz (66.6 kg)   SpO2 100%   BMI 25.20 kg/m   Gen: No acute distress, resting comfortably HEENT: TMs clear bilaterally. CV: Regular rate and rhythm with no murmurs appreciated.  Right-sided carotid bruit noted. Pulm: Normal work of breathing, clear to auscultation bilaterally with no crackles, wheezes, or rhonchi Neuro: Grossly normal, moves all extremities Psych: Normal affect and thought content  Time Spent: 45 minutes of total time was spent on the date of the encounter performing the following actions: chart review prior to seeing the patient, reviewing previous workup and records with cardiology 2 years ago, obtaining history, performing a medically necessary exam, counseling on the treatment plan, placing orders, and documenting in our EHR.        Katina Degree. [name], MD 05/22/2023 12:17 PM

## 2023-05-22 NOTE — Telephone Encounter (Signed)
Called patient to schedule referral. Patient states her PCP recommended she be seen at [facility]. Patient would like to do a provider switch to Dr. Cristal Deer due to her having the most availability out of MD's at [facility]. Please advise.

## 2023-05-23 NOTE — Telephone Encounter (Signed)
Sure.  ? ?Thanks for the update.  ? ?Tessa Lerner, DO, FACC ?

## 2023-05-26 ENCOUNTER — Encounter: Payer: Self-pay | Admitting: Family Medicine

## 2023-05-26 NOTE — Telephone Encounter (Signed)
See note

## 2023-05-27 NOTE — Telephone Encounter (Signed)
LVMTCB to schedule referral, informing patient of provider switch approval.

## 2023-05-27 NOTE — Telephone Encounter (Signed)
We need to get MRI due to her pulsatile tinnitus. We can refer her to ENT first if she prefers that.  Katina Degree. [name], MD 05/27/2023 10:32 AM

## 2023-06-01 ENCOUNTER — Ambulatory Visit
Admission: RE | Admit: 2023-06-01 | Discharge: 2023-06-01 | Disposition: A | Discharge status: Home / Self Care | Payer: Federal, State, Local not specified - PPO | Source: Ambulatory Visit | Attending: Nurse Practitioner | Admitting: Nurse Practitioner

## 2023-06-01 DIAGNOSIS — N200 Calculus of kidney: Secondary | ICD-10-CM | POA: Diagnosis not present

## 2023-06-01 DIAGNOSIS — K760 Fatty (change of) liver, not elsewhere classified: Secondary | ICD-10-CM | POA: Diagnosis not present

## 2023-06-01 DIAGNOSIS — R748 Abnormal levels of other serum enzymes: Secondary | ICD-10-CM

## 2023-06-04 ENCOUNTER — Ambulatory Visit (HOSPITAL_BASED_OUTPATIENT_CLINIC_OR_DEPARTMENT_OTHER)
Admission: RE | Admit: 2023-06-04 | Discharge: 2023-06-04 | Disposition: A | Discharge status: Home / Self Care | Payer: Federal, State, Local not specified - PPO | Source: Ambulatory Visit | Attending: Family Medicine | Admitting: Family Medicine

## 2023-06-04 DIAGNOSIS — F4310 Post-traumatic stress disorder, unspecified: Secondary | ICD-10-CM | POA: Diagnosis not present

## 2023-06-04 DIAGNOSIS — I7789 Other specified disorders of arteries and arterioles: Secondary | ICD-10-CM | POA: Insufficient documentation

## 2023-06-04 DIAGNOSIS — I779 Disorder of arteries and arterioles, unspecified: Secondary | ICD-10-CM | POA: Diagnosis not present

## 2023-06-04 DIAGNOSIS — H93A9 Pulsatile tinnitus, unspecified ear: Secondary | ICD-10-CM | POA: Diagnosis not present

## 2023-06-10 ENCOUNTER — Encounter: Payer: Self-pay | Admitting: Family Medicine

## 2023-06-10 NOTE — Telephone Encounter (Signed)
Left message to return call to our office at their convenience.  Need to go STAT to ED or urgent care for evaluation

## 2023-06-10 NOTE — Telephone Encounter (Signed)
Recommend she be evaluated ASAP if she is having ongoing symptoms.  Katina Degree. [name], MD 06/10/2023 11:01 AM

## 2023-06-10 NOTE — Telephone Encounter (Signed)
Please advise 

## 2023-06-11 ENCOUNTER — Ambulatory Visit
Admission: EM | Admit: 2023-06-11 | Discharge: 2023-06-11 | Disposition: A | Discharge status: Home / Self Care | Payer: Federal, State, Local not specified - PPO | Attending: Emergency Medicine | Admitting: Emergency Medicine

## 2023-06-11 ENCOUNTER — Emergency Department (HOSPITAL_BASED_OUTPATIENT_CLINIC_OR_DEPARTMENT_OTHER)
Admission: EM | Admit: 2023-06-11 | Discharge: 2023-06-11 | Disposition: A | Discharge status: Home / Self Care | Payer: Federal, State, Local not specified - PPO | Attending: Emergency Medicine | Admitting: Emergency Medicine

## 2023-06-11 ENCOUNTER — Encounter (HOSPITAL_BASED_OUTPATIENT_CLINIC_OR_DEPARTMENT_OTHER): Payer: Self-pay | Admitting: Emergency Medicine

## 2023-06-11 ENCOUNTER — Other Ambulatory Visit: Payer: Self-pay

## 2023-06-11 ENCOUNTER — Other Ambulatory Visit (HOSPITAL_BASED_OUTPATIENT_CLINIC_OR_DEPARTMENT_OTHER): Payer: Self-pay

## 2023-06-11 ENCOUNTER — Emergency Department (HOSPITAL_BASED_OUTPATIENT_CLINIC_OR_DEPARTMENT_OTHER): Payer: Federal, State, Local not specified - PPO

## 2023-06-11 DIAGNOSIS — E119 Type 2 diabetes mellitus without complications: Secondary | ICD-10-CM | POA: Insufficient documentation

## 2023-06-11 DIAGNOSIS — I6521 Occlusion and stenosis of right carotid artery: Secondary | ICD-10-CM | POA: Diagnosis not present

## 2023-06-11 DIAGNOSIS — Z79899 Other long term (current) drug therapy: Secondary | ICD-10-CM | POA: Diagnosis not present

## 2023-06-11 DIAGNOSIS — H579 Unspecified disorder of eye and adnexa: Secondary | ICD-10-CM

## 2023-06-11 DIAGNOSIS — I10 Essential (primary) hypertension: Secondary | ICD-10-CM | POA: Diagnosis not present

## 2023-06-11 DIAGNOSIS — Z7982 Long term (current) use of aspirin: Secondary | ICD-10-CM | POA: Insufficient documentation

## 2023-06-11 DIAGNOSIS — J0100 Acute maxillary sinusitis, unspecified: Secondary | ICD-10-CM | POA: Insufficient documentation

## 2023-06-11 LAB — CBC WITH DIFFERENTIAL/PLATELET
Abs Immature Granulocytes: 0.03 10*3/uL (ref 0.00–0.07)
Basophils Absolute: 0 10*3/uL (ref 0.0–0.1)
Basophils Relative: 0 %
Eosinophils Absolute: 0 10*3/uL (ref 0.0–0.5)
Eosinophils Relative: 0 %
HCT: 38.9 % (ref 36.0–46.0)
Hemoglobin: 13.6 g/dL (ref 12.0–15.0)
Immature Granulocytes: 0 %
Lymphocytes Relative: 25 %
Lymphs Abs: 2.3 10*3/uL (ref 0.7–4.0)
MCH: 31.3 pg (ref 26.0–34.0)
MCHC: 35 g/dL (ref 30.0–36.0)
MCV: 89.4 fL (ref 80.0–100.0)
Monocytes Absolute: 0.5 10*3/uL (ref 0.1–1.0)
Monocytes Relative: 5 %
Neutro Abs: 6.2 10*3/uL (ref 1.7–7.7)
Neutrophils Relative %: 70 %
Platelets: 253 10*3/uL (ref 150–400)
RBC: 4.35 MIL/uL (ref 3.87–5.11)
RDW: 12.3 % (ref 11.5–15.5)
WBC: 9 10*3/uL (ref 4.0–10.5)
nRBC: 0 % (ref 0.0–0.2)

## 2023-06-11 LAB — BASIC METABOLIC PANEL
Anion gap: 8 (ref 5–15)
BUN: 11 mg/dL (ref 6–20)
CO2: 28 mmol/L (ref 22–32)
Calcium: 9.7 mg/dL (ref 8.9–10.3)
Chloride: 99 mmol/L (ref 98–111)
Creatinine, Ser: 0.69 mg/dL (ref 0.44–1.00)
GFR, Estimated: >60 mL/min (ref >60)
Glucose, Bld: 196 mg/dL — ABNORMAL HIGH (ref 70–99)
Potassium: 3.8 mmol/L (ref 3.5–5.1)
Sodium: 135 mmol/L (ref 135–145)

## 2023-06-11 LAB — PREGNANCY, URINE: Preg Test, Ur: NEGATIVE

## 2023-06-11 MED ORDER — TETRACAINE HCL 0.5 % OP SOLN
2.0000 [drp] | Freq: Once | OPHTHALMIC | Status: AC
  Administered 2023-06-11: 2 [drp] via OPHTHALMIC
  Filled 2023-06-11: qty 4

## 2023-06-11 MED ORDER — FLUORESCEIN SODIUM 1 MG OP STRP
1.0000 | ORAL_STRIP | Freq: Once | OPHTHALMIC | Status: AC
  Administered 2023-06-11: 1 via OPHTHALMIC
  Filled 2023-06-11: qty 1

## 2023-06-11 MED ORDER — FLUTICASONE PROPIONATE 50 MCG/ACT NA SUSP
2.0000 | Freq: Every day | NASAL | 2 refills | Status: DC
  Filled 2023-06-11: qty 16, 30d supply, fill #0
  Filled 2023-07-09: qty 16, 30d supply, fill #1

## 2023-06-11 MED ORDER — IOHEXOL 350 MG/ML SOLN
100.0000 mL | Freq: Once | INTRAVENOUS | Status: AC | PRN
  Administered 2023-06-11: 75 mL via INTRAVENOUS

## 2023-06-11 NOTE — ED Triage Notes (Signed)
Pt arrived from home with c/o R eye "pop" sensation and facial pain that started Tuesday night. Was sent by PCP for MRI last Friday for "swishing sound/" in R ear. Went to UC today for sinus complaints and was sent here for HTN. BP at UC 188/110.

## 2023-06-11 NOTE — Discharge Instructions (Addendum)
You were seen in the emergency department for your eye discomfort and your elevated blood pressure.  Your workup here showed no abnormalities on your lab work and your CAT scan of your brain showed no injury to your brain or the blood vessels to your brain.  You did have some thickening of your sinus on the right side and may have some early sinusitis.  I suspect this is likely related to your allergies or a viral infection and you should use Flonase daily for at least the next week to help with your symptoms.  Your pressures of your eyes were on the high end of normal and you should follow-up with your eye doctor to have your eyes rechecked.  You can follow-up with your primary doctor to have your symptoms and blood pressure rechecked.  You should return to the emergency department if you are losing vision in your eye, you develop fevers or if you have any other new or concerning symptoms.

## 2023-06-11 NOTE — ED Triage Notes (Signed)
Pt prsents to UC w/ c/o "popping sound" on the inner part of her right eye 2 days ago. Pt reports after this, she felt a "sensation of liquid running down her face." Since this, she has felt pressure behind her right eye and right cheek. Reports one episode of blurry vision in right eye yesterday that resolved itself. Pt reports hx of "blocked artery in right neck," so her PCP sent her for a head and neck MRI.

## 2023-06-11 NOTE — Telephone Encounter (Signed)
Patient present at ED today

## 2023-06-11 NOTE — ED Provider Notes (Signed)
[facility]    CSN: [id number] Arrival date & time: 06/11/23  5784     History   Chief Complaint Chief Complaint  Patient presents with   Eye Problem    HPI Selena Holt is a 52 y.o. female.  2 days ago had a "popping" sensation in the right eye and felt cold sensation running from eye to nose. Resolved after a few minutes. Yesterday felt her sinuses were full especially on the right.  Had blurry vision yesterday that also resolved  Today having pressure rated 2/10 right sinus. Not painful. No pain with EOM. No headache, dizziness, vision changes, chest pain, shortness of breath, abdominal pain, or weakness.   History of HTN, reports it was well controlled and has not been on medications in several years. Her BP at PCP November 1st was 129/86  Had MRI a week ago for pulsatile tinnitus and hx carotid stenosis. No results yet   Past Medical History:  Diagnosis Date   Allergy    Anemia    hx of   Anxiety    hx of   Bipolar disorder (HCC)    Collapsed lung    "spontaneous" , teenager   Constipation    Depression    hx of   Diabetes mellitus without complication (HCC)    on meds   Dyslipidemia associated with type 2 diabetes mellitus (HCC) 02/10/2014   Glaucoma    Heart murmur    HSV-1 (herpes simplex virus 1) infection    Hypertension    on meds   Kidney stones    Meningitis    Scoliosis (and kyphoscoliosis), idiopathic     Patient Active Problem List   Diagnosis Date Noted   Tachycardia 05/22/2023   Carotid artery stenosis 05/22/2023   Menopausal symptoms 11/06/2022   Rash and other nonspecific skin eruption 09/08/2022   Other allergic rhinitis 09/08/2022   Local reaction to hymenoptera sting 09/08/2022   History of asthma 09/08/2022   Food intolerance 09/08/2022   Hypertension associated with diabetes (HCC) 03/01/2020   Back pain 05/13/2019   Anxiety 01/14/2018   Hepatocellular dysfunction 01/08/2018   Anemia, iron deficiency 01/03/2015    Vitamin B 12 deficiency 03/09/2014   Dyslipidemia associated with type 2 diabetes mellitus (HCC) 02/10/2014   Vitamin D deficiency 02/10/2014   T2DM (type 2 diabetes mellitus) (HCC) 11/11/2012    Past Surgical History:  Procedure Laterality Date   CHEST TUBE INSERTION     TYMPANOSTOMY TUBE PLACEMENT      OB History   No obstetric history on file.      Home Medications    Prior to Admission medications   Medication Sig Start Date End Date Taking? Authorizing Provider  aspirin 81 MG chewable tablet Chew 81 mg by mouth daily.    [provider]  blood glucose meter kit and supplies KIT Dispense based on patient and insurance preference. Use up to four times daily as directed. (FOR ICD-9 250.00, 250.01). 09/06/18   Ardith Dark, MD  clobetasol ointment (TEMOVATE) 0.05 % Apply 1 Application topically 2 (two) times daily. 11/06/22   Ardith Dark, MD  Cyanocobalamin (VITAMIN B-12 PO) Take 1,200 mcg by mouth. Pt only taking twice a week    [provider]  glucose blood (ONETOUCH VERIO) test strip Use as instructed to check blood sugar up to 3 times daily 12/27/20   Ardith Dark, MD  Magnesium 250 MG TABS Take 1 tablet by mouth daily at 2  PM.    [provider]  METAMUCIL FIBER PO Take by mouth.    [provider]  OneTouch Delica Lancets 33G MISC Use daily as needed to check blood sugar. 03/29/19   Ardith Dark, MD  simethicone (MYLICON) 125 MG chewable tablet Chew 1 tablet (125 mg total) by mouth every 6 (six) hours as needed for flatulence. 12/01/22   Ardith Dark, MD  cetirizine (ZYRTEC) 10 MG tablet Take 10 mg by mouth daily.  02/23/20  [provider]    Family History Family History  Problem Relation Age of Onset   Hyperlipidemia Mother    Hypertension Mother    Hyperlipidemia Father    Hypertension Father    CAD Father    Heart disease Father    Other Brother        colostomy    Alzheimer's disease Maternal Grandmother 81    Colon cancer Neg Hx    Colon polyps Neg Hx    Esophageal cancer Neg Hx    Rectal cancer Neg Hx    Stomach cancer Neg Hx    Pancreatic cancer Neg Hx     Social History Social History   Tobacco Use   Smoking status: Never   Smokeless tobacco: Never  Vaping Use   Vaping status: Never Used  Substance Use Topics   Alcohol use: No    Alcohol/week: 0.0 standard drinks of alcohol   Drug use: No     Allergies   Tetanus toxoids, Ceclor [cefaclor], Codeine, Doxycycline, Penicillins, Sulfa antibiotics, and Lisinopril   Review of Systems Review of Systems  As per HPI  Physical Exam Triage Vital Signs ED Triage Vitals  Encounter Vitals Group     BP 06/11/23 0851 (!) 181/109     Systolic BP Percentile --      Diastolic BP Percentile --      Pulse Rate 06/11/23 0851 88     Resp 06/11/23 0851 16     Temp 06/11/23 0851 (!) 97.5 F (36.4 C)     Temp Source 06/11/23 0851 Oral     SpO2 06/11/23 0851 98 %     Weight --      Height --      Head Circumference --      Peak Flow --      Pain Score 06/11/23 0847 0     Pain Loc --      Pain Education --      Exclude from Growth Chart --    No data found.  Updated Vital Signs BP (!) 188/110 (BP Location: Left Arm)   Pulse 88   Temp (!) 97.5 F (36.4 C) (Oral)   Resp 16   SpO2 98%   Visual Acuity Right Eye Distance: 20/25 Left Eye Distance: 20/25 Bilateral Distance: 20/20  Physical Exam Vitals and nursing note reviewed.  Constitutional:      General: She is not in acute distress.    Appearance: She is not ill-appearing.  HENT:     Head: Atraumatic.     Right Ear: Tympanic membrane and ear canal normal.     Left Ear: Tympanic membrane and ear canal normal.     Mouth/Throat:     Mouth: Mucous membranes are moist.     Pharynx: Oropharynx is clear.  Eyes:     General: Lids are normal. Lids are everted, no foreign bodies appreciated. Vision grossly intact. Gaze aligned appropriately.        Right eye: No  foreign  body or discharge.     Extraocular Movements: Extraocular movements intact.     Right eye: Normal extraocular motion.     Left eye: Normal extraocular motion.     Conjunctiva/sclera: Conjunctivae normal.     Pupils: Pupils are equal, round, and reactive to light.  Cardiovascular:     Rate and Rhythm: Normal rate and regular rhythm.     Heart sounds: Normal heart sounds.  Pulmonary:     Effort: Pulmonary effort is normal.     Breath sounds: Normal breath sounds.  Musculoskeletal:     Cervical back: Normal range of motion.  Skin:    General: Skin is warm and dry.  Neurological:     Mental Status: She is alert and oriented to person, place, and time.     Cranial Nerves: Cranial nerves 2-12 are intact. No cranial nerve deficit.     Sensory: Sensation is intact.     Motor: Motor function is intact. No weakness.     Coordination: Coordination is intact.     Gait: Gait is intact. Gait normal.      UC Treatments / Results  Labs (all labs ordered are listed, but only abnormal results are displayed) Labs Reviewed - No data to display  EKG   Radiology No results found.  Procedures Procedures (including critical care time)  Medications Ordered in UC Medications - No data to display  Initial Impression / Assessment and Plan / UC Course  I have reviewed the triage vital signs and the nursing notes.  Pertinent labs & imaging results that were available during my care of the patient were reviewed by me and considered in my medical decision making (see chart for details).  Vision intact. Patient having slight pressure in the right maxillary sinus area.  Her blood pressure is elevated on several rechecks 188/110 She is neurologically intact  Discussion with patient. She currently is asymptomatic. Discussed we could monitor symptoms and have her follow up with PCP and eye specialist.  However with the odd sensation felt 2 days ago with the blurry vision episode, and very elevated  BP reading from her baseline, discussed emergency department evaluation. She may need a head CT to rule out aneurism or TIA. I have recommended ED eval. Patient reports she is comfortable with this given the oddity of her prior symptoms. Sent to ED via POV  Final Clinical Impressions(s) / UC Diagnoses   Final diagnoses:  Hypertension, unspecified type  Eye problem   Discharge Instructions   None    ED Prescriptions   None    PDMP not reviewed this encounter.   Marlow Baars, [place] 06/11/23 [phone]

## 2023-06-11 NOTE — ED Provider Notes (Signed)
[place] EMERGENCY DEPARTMENT AT [facility] Provider Note   CSN: [id number] Arrival date & time: 06/11/23  3474     History  Chief Complaint  Patient presents with   Hypertension    Selena Holt is a 52 y.o. female.  Patient is a 52 year old female with a past medical history of diabetes, allergies and well-controlled hypertension not on any medications presenting to the emergency department with hypertension and right eye discomfort.  The patient reports 2 days ago she was brushing her teeth getting ready for bed when she suddenly felt a popping sensation in her right eye.  She states that it "felt like a blood vessel popped" followed by a sensation of liquid dripping down across her face from below her right eye to her left cheek.  She states that she did not see any drainage or bleeding from her nose or eye.  She states that yesterday she had a period of blurred vision in her right eye that is since resolved and has been feeling some pressure behind her right eye.  She states that it feels like sinus pressure but does not have any other symptoms of sinus congestion.  She states that she has had no headaches, nausea, vomiting, numbness or weakness.  She denies any blood thinner use.  She reports that she had an MRI of her neck to monitor her carotid stenosis about a week ago but has not yet received the results.  She was seen at urgent care this morning but were was referred to the ED for elevated blood pressure.  The history is provided by the patient.  Hypertension       Home Medications Prior to Admission medications   Medication Sig Start Date End Date Taking? Authorizing Provider  fluticasone (FLONASE) 50 MCG/ACT nasal spray Place 2 sprays into both nostrils daily. 06/11/23  Yes Elayne Snare K, DO  aspirin 81 MG chewable tablet Chew 81 mg by mouth daily.    [provider]  blood glucose meter kit and supplies KIT Dispense based on patient and  insurance preference. Use up to four times daily as directed. (FOR ICD-9 250.00, 250.01). 09/06/18   Ardith Dark, MD  clobetasol ointment (TEMOVATE) 0.05 % Apply 1 Application topically 2 (two) times daily. 11/06/22   Ardith Dark, MD  Cyanocobalamin (VITAMIN B-12 PO) Take 1,200 mcg by mouth. Pt only taking twice a week    [provider]  glucose blood (ONETOUCH VERIO) test strip Use as instructed to check blood sugar up to 3 times daily 12/27/20   Ardith Dark, MD  Magnesium 250 MG TABS Take 1 tablet by mouth daily at 2 PM.    [provider]  METAMUCIL FIBER PO Take by mouth.    [provider]  OneTouch Delica Lancets 33G MISC Use daily as needed to check blood sugar. 03/29/19   Ardith Dark, MD  simethicone (MYLICON) 125 MG chewable tablet Chew 1 tablet (125 mg total) by mouth every 6 (six) hours as needed for flatulence. 12/01/22   Ardith Dark, MD  cetirizine (ZYRTEC) 10 MG tablet Take 10 mg by mouth daily.  02/23/20  [provider]      Allergies    Tetanus toxoids, Ceclor [cefaclor], Codeine, Doxycycline, Penicillins, Sulfa antibiotics, and Lisinopril    Review of Systems   Review of Systems  Physical Exam Updated Vital Signs BP (!) 146/78   Pulse (!) 115   Temp 98.1 F (36.7 C) (  Oral)   Resp 18   Ht 5\' 4"  (1.626 m)   Wt 63.5 kg   LMP 02/03/2023   SpO2 100%   BMI 24.03 kg/m  Physical Exam Vitals and nursing note reviewed.  Constitutional:      General: She is not in acute distress.    Appearance: Normal appearance.  HENT:     Head: Normocephalic and atraumatic.     Comments: No maxillary or frontal sinus tenderness to percussion    Nose: Nose normal.     Mouth/Throat:     Mouth: Mucous membranes are moist.     Pharynx: Oropharynx is clear.  Eyes:     Extraocular Movements: Extraocular movements intact.     Conjunctiva/sclera: Conjunctivae normal.     Pupils: Pupils are equal, round, and reactive to light.  Neck:      Vascular: No carotid bruit.  Cardiovascular:     Rate and Rhythm: Normal rate and regular rhythm.     Heart sounds: Normal heart sounds.  Pulmonary:     Effort: Pulmonary effort is normal.     Breath sounds: Normal breath sounds.  Abdominal:     General: Abdomen is flat.     Palpations: Abdomen is soft.     Tenderness: There is no abdominal tenderness.  Musculoskeletal:        General: Normal range of motion.     Cervical back: Normal range of motion and neck supple.  Skin:    General: Skin is warm and dry.  Neurological:     General: No focal deficit present.     Mental Status: She is alert and oriented to person, place, and time.     Cranial Nerves: No cranial nerve deficit.     Sensory: No sensory deficit.     Motor: No weakness.     Coordination: Coordination normal.     Gait: Gait normal.  Psychiatric:        Mood and Affect: Mood normal.        Behavior: Behavior normal.     ED Results / Procedures / Treatments   Labs (all labs ordered are listed, but only abnormal results are displayed) Labs Reviewed  BASIC METABOLIC PANEL - Abnormal; Notable for the following components:      Result Value   Glucose, Bld 196 (*)    All other components within normal limits  CBC WITH DIFFERENTIAL/PLATELET  PREGNANCY, URINE    EKG None  Radiology CT Angio Head Neck W WO CM  Result Date: 06/11/2023 CLINICAL DATA:  Ophthalmoplegia EXAM: CT ANGIOGRAPHY HEAD AND NECK WITH AND WITHOUT CONTRAST TECHNIQUE: Multidetector CT imaging of the head and neck was performed using the standard protocol during bolus administration of intravenous contrast. Multiplanar CT image reconstructions and MIPs were obtained to evaluate the vascular anatomy. Carotid stenosis measurements (when applicable) are obtained utilizing NASCET criteria, using the distal internal carotid diameter as the denominator. RADIATION DOSE REDUCTION: This exam was performed according to the departmental dose-optimization  program which includes automated exposure control, adjustment of the mA and/or kV according to patient size and/or use of iterative reconstruction technique. CONTRAST:  75mL OMNIPAQUE IOHEXOL 350 MG/ML SOLN COMPARISON:  MRA head/neck 06/04/23 FINDINGS: CT HEAD FINDINGS Brain: No hemorrhage. No hydrocephalus. No extra-axial fluid collection. No CT evidence of cortical infarct. No mass effect. No mass lesion. Vascular: No hyperdense vessel or unexpected calcification. Skull: Normal. Negative for fracture or focal lesion. Sinuses/Orbits: No middle ear or mastoid effusion. Mucosal thickening right maxillary  sinus. Orbits are unremarkable. Other: None Review of the MIP images confirms the above findings CTA NECK FINDINGS Aortic arch: Standard branching. Imaged portion shows no evidence of aneurysm or dissection. No significant stenosis of the major arch vessel origins. Right carotid system: No evidence of dissection or occlusion. There is a proximally 50% stenosis of the origin of the right ICA secondary to mixed calcified and noncalcified atherosclerotic plaque. Left carotid system: No evidence of dissection, stenosis (50% or greater), or occlusion. Vertebral arteries: Right-dominant. No evidence of dissection, stenosis (50% or greater), or occlusion. Skeleton: Negative. Other neck: Negative. Upper chest: Negative. Review of the MIP images confirms the above findings CTA HEAD FINDINGS Anterior circulation: No significant stenosis, proximal occlusion, aneurysm, or vascular malformation. Posterior circulation: No significant stenosis, proximal occlusion, aneurysm, or vascular malformation. Venous sinuses: As permitted by contrast timing, patent. Anatomic variants: None Review of the MIP images confirms the above findings IMPRESSION: 1. No acute intracranial abnormality. 2. No intracranial large vessel occlusion or significant stenosis. 3. Approximately 50% stenosis of the origin of the right ICA secondary to mixed  calcified and noncalcified atherosclerotic plaque. Electronically Signed   By: Lorenza Cambridge M.D.   On: 06/11/2023 12:05    Procedures Procedures    Medications Ordered in ED Medications  fluorescein ophthalmic strip 1 strip (1 strip Both Eyes Given by Other 06/11/23 1019)  tetracaine (PONTOCAINE) 0.5 % ophthalmic solution 2 drop (2 drops Both Eyes Given by Other 06/11/23 1019)  iohexol (OMNIPAQUE) 350 MG/ML injection 100 mL (75 mLs Intravenous Contrast Given 06/11/23 1115)    ED Course/ Medical Decision Making/ A&P Clinical Course as of 06/11/23 1253  Thu Jun 11, 2023  1100 On eye exam, no uptake on fluorescein staining, R eye pressure 23, L eye pressure 24. Patient reports history of mildly elevated pressures that are being monitored by her eye doctor. Low suspicion for acute angle closure glaucoma with minimally elevated pressures.  [VK]  1223 No acute abnormality on CTA to explain symptoms. Will re-check BP to determine if she needs to be restarted on BP meds. Recommended to follow up with her PCP and eye doctor. [VK]    Clinical Course User Index [VK] Rexford Maus, DO                                 Medical Decision Making This patient presents to the ED with chief complaint(s) of HTN, R eye discomfort with pertinent past medical history of HTN well controlled, DM, allergies which further complicates the presenting complaint. The complaint involves an extensive differential diagnosis and also carries with it a high risk of complications and morbidity.    The differential diagnosis includes considering hypertensive emergency though no sudden onset headache or focal neurologic deficits making this less likely, considering vertebral or carotid artery dissection with sudden onset of symptoms, considering acute angle-closure glaucoma though less likely without consistent vision changes or abnormalities seen externally on ocular exam, hypertensive urgency, sinusitis  Additional  history obtained: Additional history obtained from N/A Records reviewed Primary Care Documents and urgent care records  ED Course and Reassessment: On patient's arrival to the emergency department she is hypertensive with blood pressure of 200/103, otherwise is hemodynamically stable in no acute distress, reporting only mild pressure behind her right eye.  Patient has no focal neurologic deficits on exam.  Patient will have labs and CTA of her head and neck to evaluate for  possible cause of her symptoms and will have eye exam with visual acuity and ocular pressures.  She declined any pain control at this time and will be closely reassessed.  Independent labs interpretation:  The following labs were independently interpreted: Within normal range  Independent visualization of imaging: - I independently visualized the following imaging with scope of interpretation limited to determining acute life threatening conditions related to emergency care: CTA head/neck, which revealed no acute disease  Consultation: - Consulted or discussed management/test interpretation w/ external professional: N/A  Consideration for admission or further workup: Patient has no emergent conditions requiring admission or further work-up at this time and is stable for discharge home with primary care and optho follow-up  Social Determinants of health: N/A    Amount and/or Complexity of Data Reviewed Labs: ordered. Radiology: ordered.  Risk Prescription drug management.          Final Clinical Impression(s) / ED Diagnoses Final diagnoses:  Acute maxillary sinusitis, recurrence not specified  Hypertension, unspecified type    Rx / DC Orders ED Discharge Orders          Ordered    fluticasone (FLONASE) 50 MCG/ACT nasal spray  Daily        06/11/23 1251              Rexford Maus, DO 06/11/23 1253

## 2023-06-12 ENCOUNTER — Telehealth: Payer: Self-pay | Admitting: Family Medicine

## 2023-06-12 ENCOUNTER — Encounter: Payer: Self-pay | Admitting: Family Medicine

## 2023-06-12 NOTE — Telephone Encounter (Signed)
Pt was in the ED yesterday and wanted you to call her back. She has some questions. I did make an appt for Monday but she insisted on Stella to call her back.

## 2023-06-12 NOTE — Telephone Encounter (Signed)
Spoke with patient, aware of appointment changes  Will come early to do [facility] due to work hours

## 2023-06-15 ENCOUNTER — Ambulatory Visit: Payer: Federal, State, Local not specified - PPO | Admitting: Family Medicine

## 2023-06-16 ENCOUNTER — Encounter: Payer: Self-pay | Admitting: Family Medicine

## 2023-06-16 ENCOUNTER — Telehealth (INDEPENDENT_AMBULATORY_CARE_PROVIDER_SITE_OTHER): Payer: Federal, State, Local not specified - PPO | Admitting: Family Medicine

## 2023-06-16 VITALS — Ht 64.0 in

## 2023-06-16 DIAGNOSIS — E119 Type 2 diabetes mellitus without complications: Secondary | ICD-10-CM

## 2023-06-16 DIAGNOSIS — E1159 Type 2 diabetes mellitus with other circulatory complications: Secondary | ICD-10-CM

## 2023-06-16 DIAGNOSIS — I152 Hypertension secondary to endocrine disorders: Secondary | ICD-10-CM | POA: Diagnosis not present

## 2023-06-16 LAB — POCT GLYCOSYLATED HEMOGLOBIN (HGB A1C): Hemoglobin A1C: 6.8 % — AB (ref 4.0–5.6)

## 2023-06-16 NOTE — Assessment & Plan Note (Signed)
Blood pressure readings are now back at baseline.  She has been well-controlled at home without any medications.  Unclear reason for her recent spike in blood pressure though she does admit to a few dietary indiscretions and being more inactive recently.  Now that she is back to baseline and asymptomatic do not think we need to start any antihypertensives at this point however she will continue to monitor frequently and let us know if she has any persistent elevations above 140/90.

## 2023-06-16 NOTE — Progress Notes (Signed)
Selena Holt is a 52 y.o. female who presents today for a virtual office visit.  Assessment/Plan:  New/Acute Problems: Facial Paresthesias  She has not had any recurrence over the last week or so.  We discussed limitations of virtual visit and inability to perform physical exam.  We did order an MRI couple weeks ago for her pulsatile tinnitus however these results have not yet come back.  CTA in the emergency room did not show any acute abnormalities.  It is reassuring that she has not had any recurrence over the last week and her workup thus far has been negative.  It is possible that she may have had a mild transient trigeminal neuralgia.  She did have slight thickening noted at right maxillary sinus on her CT scan but has no other signs or symptoms of sinusitis - do not think this is contributing significantly.  She did have significant elevated blood pressure reading at the time of her symptoms however this has since resolved as well.  Given the resolution of her symptoms, do not think we need to pursue any further workup at this time pending her MRI results.  If she does have recurrence would consider referral to neurology for evaluation for trigeminal neuralgia.  We discussed reasons to return to care and seek emergent care.  Chronic Problems Addressed Today: Hypertension associated with diabetes (HCC) Blood pressure readings are now back at baseline.  She has been well-controlled at home without any medications.  Unclear reason for her recent spike in blood pressure though she does admit to a few dietary indiscretions and being more inactive recently.  Now that she is back to baseline and asymptomatic do not think we need to start any antihypertensives at this point however she will continue to monitor frequently and let us know if she has any persistent elevations above 140/90.  T2DM (type 2 diabetes mellitus) (HCC) A1c this morning was 6.8.  She would like to avoid medications if at all  possible.  She will continue to work on lifestyle modifications.  She will come back in 3 months and we can recheck A1c at that time.  If we do need to start medications she would likely do well with a GLP-1 agonist.  We can also restart metformin if needed.     Subjective:  HPI:  See A/P for status of chronic conditions.  Patient is here today for ED follow-up.  We last saw her about 3 weeks ago with concern for pulsatile tinnitus.At that time we ordered MRA head and neck as well as refer her to see ENT.  She did obtain her MRI however this has not yet been read by radiology.  She went to the ED 5 days ago with 2 days of a popping sensation in her right eye and sensation of cold running from her eye to her nose.  Also had an episode of blurred vision.  They obtained a head and neck CTA which did not show any new acute findings however did show 50% stenosis of right ICA. She was told that she may be developing a sinus infection and was told to start Flonase.  Patient states that she was already counting better at the time of her ED visit.  She was discharged home with instruction to follow-up with her PCP.  She has done well for the last several days.  She has not had any recurrence of symptoms.  She has been monitoring her blood pressure at home and it  has been averaging around 110/80 range.  No headache.  No vision changes.  No weakness or numbness.       Objective/Observations  Physical Exam: Gen: NAD, resting comfortably Pulm: Normal work of breathing Neuro: Grossly normal, moves all extremities Psych: Normal affect and thought content  Virtual Visit via Video   I connected with [name] on 06/16/23 at  2:20 PM EST by a video enabled telemedicine application and verified that I am speaking with the correct person using two identifiers. The limitations of evaluation and management by telemedicine and the availability of in person appointments were discussed. The patient expressed  understanding and agreed to proceed.   Patient location: Home Provider location: [place] [employer] Persons participating in the virtual visit: Myself and Patient     Katina Degree. [name], MD 06/16/2023 9:09 AM

## 2023-06-16 NOTE — Assessment & Plan Note (Signed)
A1c this morning was 6.8.  She would like to avoid medications if at all possible.  She will continue to work on lifestyle modifications.  She will come back in 3 months and we can recheck A1c at that time.  If we do need to start medications she would likely do well with a GLP-1 agonist.  We can also restart metformin if needed.

## 2023-06-25 ENCOUNTER — Encounter: Payer: Federal, State, Local not specified - PPO | Admitting: Family Medicine

## 2023-06-29 NOTE — Progress Notes (Signed)
Her MRI shows that she has a slight irregularity in her right carotid artery. It is possible that this could be causing the whooshing sound that she is hearing in her ears.  She still needs to follow-up with ENT as we discussed at her previous office visit however it would be a good idea for her to see a vascular doctor to take a closer look at the arteries in her neck.  Please place referral to vascular surgery.

## 2023-07-01 ENCOUNTER — Other Ambulatory Visit: Payer: Self-pay | Admitting: *Deleted

## 2023-07-01 DIAGNOSIS — I6521 Occlusion and stenosis of right carotid artery: Secondary | ICD-10-CM

## 2023-07-02 DIAGNOSIS — F4310 Post-traumatic stress disorder, unspecified: Secondary | ICD-10-CM | POA: Diagnosis not present

## 2023-07-09 ENCOUNTER — Ambulatory Visit: Payer: Federal, State, Local not specified - PPO | Admitting: Surgery

## 2023-07-09 ENCOUNTER — Encounter: Payer: Self-pay | Admitting: Surgery

## 2023-07-09 ENCOUNTER — Encounter: Payer: Self-pay | Admitting: Family Medicine

## 2023-07-09 ENCOUNTER — Ambulatory Visit (INDEPENDENT_AMBULATORY_CARE_PROVIDER_SITE_OTHER): Payer: Federal, State, Local not specified - PPO | Admitting: Family Medicine

## 2023-07-09 ENCOUNTER — Telehealth: Payer: Self-pay | Admitting: *Deleted

## 2023-07-09 ENCOUNTER — Other Ambulatory Visit (HOSPITAL_BASED_OUTPATIENT_CLINIC_OR_DEPARTMENT_OTHER): Payer: Self-pay

## 2023-07-09 VITALS — BP 154/87 | HR 101 | Temp 98.6°F | Resp 20 | Ht 64.0 in | Wt 141.8 lb

## 2023-07-09 VITALS — BP 147/82 | Temp 97.8°F | Ht 64.0 in | Wt 143.8 lb

## 2023-07-09 DIAGNOSIS — I6523 Occlusion and stenosis of bilateral carotid arteries: Secondary | ICD-10-CM

## 2023-07-09 DIAGNOSIS — I152 Hypertension secondary to endocrine disorders: Secondary | ICD-10-CM

## 2023-07-09 DIAGNOSIS — N951 Menopausal and female climacteric states: Secondary | ICD-10-CM

## 2023-07-09 DIAGNOSIS — E1159 Type 2 diabetes mellitus with other circulatory complications: Secondary | ICD-10-CM | POA: Diagnosis not present

## 2023-07-09 DIAGNOSIS — H93A9 Pulsatile tinnitus, unspecified ear: Secondary | ICD-10-CM | POA: Diagnosis not present

## 2023-07-09 DIAGNOSIS — E1169 Type 2 diabetes mellitus with other specified complication: Secondary | ICD-10-CM | POA: Diagnosis not present

## 2023-07-09 DIAGNOSIS — E785 Hyperlipidemia, unspecified: Secondary | ICD-10-CM

## 2023-07-09 DIAGNOSIS — I6521 Occlusion and stenosis of right carotid artery: Secondary | ICD-10-CM | POA: Diagnosis not present

## 2023-07-09 NOTE — Assessment & Plan Note (Signed)
We can recheck lipids in 3 months at [facility].  As above we have recommended statin as have her previous cardiologist and vascular surgeon.  She is extremely hesitant to do this.  She is interested in possibly exploring alternatives to statins.  She can discuss this with cardiology next month.  She will continue to work on lifestyle modifications.

## 2023-07-09 NOTE — Telephone Encounter (Signed)
Form printed. Patient has an OV today with PCP

## 2023-07-09 NOTE — Progress Notes (Signed)
Selena Holt is a 52 y.o. female who presents today for an office visit.  Assessment/Plan:  Chronic Problems Addressed Today: Pulsatile tinnitus Overall symptoms are stable.  Vascular surgery did not think that her carotid stenosis was likely the cause of this.  No other obvious abnormality on her recent MRA. She does have appointment with ENT pending and will keep this appointment.  Vascular thought that her symptoms are likely due to an inner ear issue.  She will let us know if she has any changes in symptoms between now and her visit with ENT.  FMLA paperwork was completed today.  Carotid artery stenosis Continue management per vascular surgery.  She is still very hesitant to start a statin.  She will continue aching her baby aspirin.  She would like to work on lifestyle modifications to help lower her cholesterol.  We did discuss importance of aggressive risk factor modification.  She can also discuss further with cardiology about alternatives to statin as well.   Dyslipidemia associated with type 2 diabetes mellitus (HCC) We can recheck lipids in 3 months at [facility].  As above we have recommended statin as have her previous cardiologist and vascular surgeon.  She is extremely hesitant to do this.  She is interested in possibly exploring alternatives to statins.  She can discuss this with cardiology next month.  She will continue to work on lifestyle modifications.  Hypertension associated with diabetes (HCC) Mildly elevated.  She was at goal at our last visit.  We did discuss starting antihypertensive however she is hesitant to this at this point.  She will continue monitor at home and let us know if persistently elevated and we can start amlodipine.  Menopausal symptoms Patient still having quite a bit of vasomotor symptoms.  She is very hesitant to start any medications at this point.  We briefly did discuss treatment options including hormone replacement and SSRIs.  She will discuss  this with her gynecology appointment in a couple of months.     Subjective:  HPI:  See A/P for status of chronic conditions.  Patient is here today for FMLA form completion.  She has no acute concerns.  We last saw her about 3 weeks ago via virtual visit for type 2 diabetes follow-up and new finding of facial paresthesias.  A1c at that time was 6.8 via lifestyle modifications. For her facial paresthesias, we elected to proceed with watchful waiting given that she had recent imaging that was negative and not had any recurrence at time of her visit.  Since our last visit she has done relatively well.  She did see vascular surgery earlier today for her carotid stenosis.  They recommended continuing with medical management at this point including recommendation for statin and aspirin.  They will repeat evaluation in 1 year.  She needs FMLA paperwork completed due to her multiple physician appointments over the last several weeks.  We initially saw her on November 1 with concern for pulsatile tinnitus.  At that time we ordered urgent evaluation including MRA head and neck as well as referral to ENT and cardiology.  Her MRA neck showed mild irregularity at the right carotid bifurcation and proximal right ICA.  MRI neck was negative.  Referred her to vascular as above.        Objective:  Physical Exam: BP (!) 147/82   Temp 97.8 F (36.6 C) (Temporal)   Ht 5\' 4"  (1.626 m)   Wt 143 lb 12.8 oz (65.2 kg)  LMP 02/03/2023   SpO2 96%   BMI 24.68 kg/m   Gen: No acute distress, resting comfortably Neuro: Grossly normal, moves all extremities Psych: Normal affect and thought content      Brisa Auth M. [name], MD 07/09/2023 3:02 PM

## 2023-07-09 NOTE — Assessment & Plan Note (Signed)
Patient still having quite a bit of vasomotor symptoms.  She is very hesitant to start any medications at this point.  We briefly did discuss treatment options including hormone replacement and SSRIs.  She will discuss this with her gynecology appointment in a couple of months.

## 2023-07-09 NOTE — Assessment & Plan Note (Signed)
Mildly elevated.  She was at goal at our last visit.  We did discuss starting antihypertensive however she is hesitant to this at this point.  She will continue monitor at home and let us know if persistently elevated and we can start amlodipine.

## 2023-07-09 NOTE — Assessment & Plan Note (Signed)
Continue management per vascular surgery.  She is still very hesitant to start a statin.  She will continue aching her baby aspirin.  She would like to work on lifestyle modifications to help lower her cholesterol.  We did discuss importance of aggressive risk factor modification.  She can also discuss further with cardiology about alternatives to statin as well.

## 2023-07-09 NOTE — Progress Notes (Signed)
[facility] [place]  Patient name: Selena Holt MRN: [id number] DOB: [birth year] Sex: female   REQUESTING PROVIDER:    Jacquiline Doe   REASON FOR CONSULT:    Carotid  HISTORY OF PRESENT ILLNESS:   Selena Holt is a 52 y.o. female, who is is being worked up for tinnitus he describes a heartbeat and a swishing type feeling right ear.  She has a referral to ENT but has not seen them.  She has undergone MRI which was unremarkable.  She did develop an episode where she felt like a blood vessel burst in her eye.  She felt something draining down over her face.  This led her to go to the ER and get a CT angiogram that was unremarkable with the exception of approximately less than 50% right carotid stenosis.  She denies any strokelike symptoms.  Specifically, she denies numbness or weakness in either extremity.  She denies slurred speech.  She denies amaurosis fugax.  Patient does suffer from type 2 diabetes.  She is medically managed for hypertension.  She is a non-smoker.  PAST MEDICAL HISTORY    Past Medical History:  Diagnosis Date   Allergy    Anemia    hx of   Anxiety    hx of   Bipolar disorder (HCC)    Collapsed lung    "spontaneous" , teenager   Constipation    Depression    hx of   Diabetes mellitus without complication (HCC)    on meds   Dyslipidemia associated with type 2 diabetes mellitus (HCC) 02/10/2014   Glaucoma    Heart murmur    HSV-1 (herpes simplex virus 1) infection    Hypertension    on meds   Kidney stones    Meningitis    Scoliosis (and kyphoscoliosis), idiopathic      FAMILY HISTORY   Family History  Problem Relation Age of Onset   Hyperlipidemia Mother    Hypertension Mother    Hyperlipidemia Father    Hypertension Father    CAD Father    Heart disease Father    Other Brother        colostomy    Alzheimer's disease Maternal Grandmother 36   Colon cancer Neg Hx    Colon  polyps Neg Hx    Esophageal cancer Neg Hx    Rectal cancer Neg Hx    Stomach cancer Neg Hx    Pancreatic cancer Neg Hx     SOCIAL HISTORY:   Social History   Socioeconomic History   Marital status: Single    Spouse name: Not on file   Number of children: 2   Years of education: Not on file   Highest education level: Not on file  Occupational History   Occupation: Furniture conservator/restorer: [employer]    Comment: [employer]  Tobacco Use   Smoking status: Never   Smokeless tobacco: Never  Vaping Use   Vaping status: Never Used  Substance and Sexual Activity   Alcohol use: No    Alcohol/week: 0.0 standard drinks of alcohol   Drug use: No   Sexual activity: Never  Other Topics Concern   Not on file  Social History Narrative   She lives with her grown son. 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   Social Drivers of Corporate investment banker Strain: Not on file  Food Insecurity: Low Risk  (05/22/2023)   Received from [facility]  Hunger Vital Sign    Worried About Running Out of Food in the Last Year: Never true    Ran Out of Food in the Last Year: Never true  Transportation Needs: Patient Declined (05/22/2023)   PRAPARE - Transportation    Lack of Transportation (Medical): Patient declined    Lack of Transportation (Non-Medical): Patient declined  Physical Activity: Unknown (05/22/2023)   Exercise Vital Sign    Days of Exercise per Week: Patient declined    Minutes of Exercise per Session: Not on file  Stress: Not on file  Social Connections: Unknown (05/22/2023)   Social Connection and Isolation Panel [NHANES]    Frequency of Communication with Friends and Family: Not on file    Frequency of Social Gatherings with Friends and Family: Not on file    Attends Religious Services: Not on file    Active Member of Clubs or Organizations: Patient declined    Attends Banker Meetings: Not on file    Marital Status: Patient declined  Intimate Partner  Violence: Not on file    ALLERGIES:    Allergies  Allergen Reactions   Tetanus Toxoids Anaphylaxis    tetanus   Ceclor [Cefaclor] Hives   Codeine Hives   Doxycycline Other (See Comments)    Hives   Penicillins Hives   Sulfa Antibiotics Hives   Lisinopril Other (See Comments)    fatigue    CURRENT MEDICATIONS:    Current Outpatient Medications  Medication Sig Dispense Refill   aspirin 81 MG chewable tablet Chew 81 mg by mouth daily.     blood glucose meter kit and supplies KIT Dispense based on patient and insurance preference. Use up to four times daily as directed. (FOR ICD-9 250.00, 250.01). 1 each 0   clobetasol ointment (TEMOVATE) 0.05 % Apply 1 Application topically 2 (two) times daily. 30 g 0   Cyanocobalamin (VITAMIN B-12 PO) Take 1,200 mcg by mouth. Pt only taking twice a week     fluticasone (FLONASE) 50 MCG/ACT nasal spray Place 2 sprays into both nostrils daily. 16 g 2   glucose blood (ONETOUCH VERIO) test strip Use as instructed to check blood sugar up to 3 times daily 300 each 0   Magnesium 250 MG TABS Take 1 tablet by mouth daily at 2 PM.     METAMUCIL FIBER PO Take by mouth.     OneTouch Delica Lancets 33G MISC Use daily as needed to check blood sugar. 100 each 2   simethicone (MYLICON) 125 MG chewable tablet Chew 1 tablet (125 mg total) by mouth every 6 (six) hours as needed for flatulence. 30 tablet 0   No current facility-administered medications for this visit.    REVIEW OF SYSTEMS:   [X]  denotes positive finding, [ ]  denotes negative finding Cardiac  Comments:  Chest pain or chest pressure:    Shortness of breath upon exertion:    Short of breath when lying flat:    Irregular heart rhythm:        Vascular    Pain in calf, thigh, or hip brought on by ambulation:    Pain in feet at night that wakes you up from your sleep:     Blood clot in your veins:    Leg swelling:         Pulmonary    Oxygen at home:    Productive cough:     Wheezing:          Neurologic    Sudden weakness  in arms or legs:     Sudden numbness in arms or legs:     Sudden onset of difficulty speaking or slurred speech:    Temporary loss of vision in one eye:     Problems with dizziness:         Gastrointestinal    Blood in stool:      Vomited blood:         Genitourinary    Burning when urinating:     Blood in urine:        Psychiatric    Major depression:         Hematologic    Bleeding problems:    Problems with blood clotting too easily:        Skin    Rashes or ulcers:        Constitutional    Fever or chills:     PHYSICAL EXAM:   There were no vitals filed for this visit.  GENERAL: The patient is a well-nourished female, in no acute distress. The vital signs are documented above. CARDIAC: There is a regular rate and rhythm.  VASCULAR: Palpable right's pedis pulse, palpable left posterior tibial pulse.  Right carotid bruit PULMONARY: Nonlabored respirations ABDOMEN: Soft and non-tender MUSCULOSKELETAL: There are no major deformities or cyanosis. NEUROLOGIC: No focal weakness or paresthesias are detected. SKIN: There are no ulcers or rashes noted. PSYCHIATRIC: The patient has a normal affect.  STUDIES:   I have reviewed the following: MRI Neck: 1. Mild irregularity at the right carotid bifurcation and proximal right ICA without significant stenosis. 2. Unremarkable left carotid system. 3. Antegrade flow in both vertebral arteries.  CTA Neck: 1. No acute intracranial abnormality. 2. No intracranial large vessel occlusion or significant stenosis. 3. Approximately 50% stenosis of the origin of the right ICA secondary to mixed calcified and noncalcified atherosclerotic plaque.    ASSESSMENT and PLAN   Asymptomatic right carotid stenosis: I discussed with the patient that as long as she remains asymptomatic and her stenosis is less than 80%, I would not recommend surgical correction of her stenosis.  Her best risk of stroke  prevention is via medical management.  This would maintain excellent control of her blood pressure and diabetes, continuing to be abstinent from tobacco.  Continue her aspirin.  I also talked to her about starting statin therapy with a goal LDL of less than 70.  She is going to have further conversations with her primary team about this.  I will plan on having her follow-up in 1 year with a repeat ultrasound of her carotid arteries.  She knows to call [phone] immediately if she develops strokelike symptoms   Charlena Cross, MD, FACS [facility] [phone] Pager [phone]

## 2023-07-09 NOTE — Assessment & Plan Note (Addendum)
Overall symptoms are stable.  Vascular surgery did not think that her carotid stenosis was likely the cause of this.  No other obvious abnormality on her recent MRA. She does have appointment with ENT pending and will keep this appointment.  Vascular thought that her symptoms are likely due to an inner ear issue.  She will let us know if she has any changes in symptoms between now and her visit with ENT.  FMLA paperwork was completed today.

## 2023-07-09 NOTE — Telephone Encounter (Signed)
Copied from CRM [id number]. Topic: Appointments - Appointment Info/Confirmation >> Jul 09, 2023  8:53 AM Kathryne Eriksson wrote: Patient/patient representative is calling for information regarding an appointment.  >> Jul 09, 2023  8:54 AM Kathryne Eriksson wrote: Patient called wanting to know the name and number of the EMT specialist the provider referred her to.   Information given to patient  Referring To Provider Information [facility] [address] [place], [place] [address] [place] [address] [phone]

## 2023-07-09 NOTE — Patient Instructions (Signed)
It was very nice to see you today!  We completed your Family Medical Leave Act [facility]) form today.   Please keep your appoint with ENT.  Please keep an eye on your blood pressure and let us know if it is persistently elevated.  Return if symptoms worsen or fail to improve.   Take care, Dr [name]  PLEASE NOTE:  If you had any lab tests, please let us know if you have not heard back within a few days. You may see your results on mychart before we have a chance to review them but we will give you a call once they are reviewed by [place].   If we ordered any referrals today, please let us know if you have not heard from their office within the next week.   If you had any urgent prescriptions sent in today, please check with the pharmacy within an hour of our visit to make sure the prescription was transmitted appropriately.   Please try these tips to maintain a healthy lifestyle:  Eat at least 3 REAL meals and 1-2 snacks per day.  Aim for no more than 5 hours between eating.  If you eat breakfast, please do so within one hour of getting up.   Each meal should contain half fruits/vegetables, one quarter protein, and one quarter carbs (no bigger than a computer mouse)  Cut down on sweet beverages. This includes juice, soda, and sweet tea.   Drink at least 1 glass of water with each meal and aim for at least 8 glasses per day  Exercise at least 150 minutes every week.

## 2023-07-17 ENCOUNTER — Other Ambulatory Visit: Payer: Self-pay

## 2023-07-17 DIAGNOSIS — I6521 Occlusion and stenosis of right carotid artery: Secondary | ICD-10-CM

## 2023-08-07 ENCOUNTER — Telehealth (INDEPENDENT_AMBULATORY_CARE_PROVIDER_SITE_OTHER): Payer: Self-pay | Admitting: Otolaryngology

## 2023-08-07 NOTE — Telephone Encounter (Signed)
Reminder Call: Date: 08/10/2023 Status: Sch  Time: 10:15 AM [address] [place], [place] [address]  Confirmed w/patient    Date: 08/10/2023 Status: Sch  Time: 10:10 AM [address] [place], [place] [address]  Confirmed w/patient

## 2023-08-10 ENCOUNTER — Ambulatory Visit (INDEPENDENT_AMBULATORY_CARE_PROVIDER_SITE_OTHER): Payer: Federal, State, Local not specified - PPO | Admitting: Audiology

## 2023-08-10 ENCOUNTER — Ambulatory Visit (INDEPENDENT_AMBULATORY_CARE_PROVIDER_SITE_OTHER): Payer: Federal, State, Local not specified - PPO | Admitting: Otolaryngology

## 2023-08-10 ENCOUNTER — Encounter (INDEPENDENT_AMBULATORY_CARE_PROVIDER_SITE_OTHER): Payer: Self-pay

## 2023-08-10 VITALS — BP 170/95 | HR 97 | Ht 64.0 in | Wt 143.0 lb

## 2023-08-10 DIAGNOSIS — H93291 Other abnormal auditory perceptions, right ear: Secondary | ICD-10-CM

## 2023-08-10 DIAGNOSIS — H93A1 Pulsatile tinnitus, right ear: Secondary | ICD-10-CM

## 2023-08-10 DIAGNOSIS — Z0110 Encounter for examination of ears and hearing without abnormal findings: Secondary | ICD-10-CM | POA: Diagnosis not present

## 2023-08-10 DIAGNOSIS — H9011 Conductive hearing loss, unilateral, right ear, with unrestricted hearing on the contralateral side: Secondary | ICD-10-CM

## 2023-08-10 DIAGNOSIS — I6521 Occlusion and stenosis of right carotid artery: Secondary | ICD-10-CM

## 2023-08-10 DIAGNOSIS — H9041 Sensorineural hearing loss, unilateral, right ear, with unrestricted hearing on the contralateral side: Secondary | ICD-10-CM

## 2023-08-10 NOTE — Patient Instructions (Addendum)
I have ordered an imaging study for you to complete prior to your next visit. Please call [facility] Scheduling at [phone] to schedule your imaging if you have not received a call within 24 hours. If you are unable to complete your imaging study prior to your next scheduled visit please call our office to let us know.

## 2023-08-10 NOTE — Progress Notes (Signed)
Dear Dr. [name], Here is my assessment for our mutual patient, Selena Holt. Thank you for allowing me the opportunity to care for your patient. Please do not hesitate to contact me should you have any other questions. Sincerely, Dr. Jovita Kussmaul  Otolaryngology Clinic Note Referring provider: Dr. [name] HPI:  Selena Holt is a 53 y.o. female kindly referred by Dr. [name] for evaluation of right pulsatile tinnitus.  Initial visit (07/2023): Patient reports: initially started bilaterally but now having only right ear pulsatile tinnitus ("swooshing sound" and concordant with her heart beat). Sound seems a bit muffled now. She reports that this started around July 2024. No antecedent event - no loud noise, trauma, infections. She initially thought maybe she had wax build up due to some discomfort in the ear (deep) but discomfort resolved shortly thereafter it seems and maybe some imbalance (but no vertigo). No autophony, no sound/pressure induced vertigo. She feels like hearing has declined in the right ear since she had an episode of spinal meningitis in 2005. She does report some popping/crackling randomly. No other recent med changes, on ASA 81  She has seen vascular surgery for this as she does have some irregularity/stenosis at right carotid bifurcation. Reports that vascular does not think this is the cause of her tinnitus. She has also had a CTA and MRA (see below). No headaches, no vision changes.   Patient denies: ear pain, fullness, vertigo, drainage Patient also denies barotrauma, vestibular suppressant use, ototoxic medication use Prior ear surgery: Right tympanostomy tube (2005) during spinal meningitis --- thought to be due to Otitis media (Dr. Ezzard Standing performed the procedure).   H&N Surgery: none besides right Tymp tube Personal or FHx of bleeding dz or anesthesia difficulty: no  GLP-1: no AP/AC: ASA 81  Tobacco: no. Lives in [place], [place] PMHx: HDL, Carotid Stenosis, HTN,  DM  Independent Review of Additional Tests or Records:  Dr. [name] (FM) 12/192024: Noted pulsatile tinnitus, stable sx; has seen vascular surgery for carotid stenosis; prior MRI no abnormalities; Dx: Pulsatile tinnitus; Rx: Ref ENT Dr. [name] (FM) 05/22/2023: Noted "swishing sensation in ear" starting this fall; no antecedent events; sounds like heartbeat. No vertigo, other ear sx; b/l IC stenosis 50-69%; Dx: Pulsatile tinnitus; Rx: ref Vascular Dr. Doran Heater 03/13/2017: Right ETD, no fullness, no vertigo; rare tinnitus, non-pulsatile; Did do audio - A/A b/l, essentially normal hearing  07/2023 Audiogram was independently reviewed and interpreted by me and it reveals - generally normal hearing thresholds; minimal (<10dB ABG AD); SRT 25 dB AD, 15 dB AS; WRT 100% at 60dB and55dB AD and AS respectively; A/A tymps    SNHL= Sensorineural hearing loss  MR Angio Head, Neck, and CTA Head/Neck w/wo reviewed independently with attention to ears: agree no vertebral artery stenosis, noted irregularity at right carotid bifurcation and proximal right ICA, CTA with some stenosis over right ICA; no mastoid or ME effusion; cuts thick but no significant ossicular chain or otic capsule abnormality or middle era mass   PMH/Meds/All/SocHx/FamHx/ROS:   Past Medical History:  Diagnosis Date   Allergy    Anemia    hx of   Anxiety    hx of   Bipolar disorder (HCC)    Collapsed lung    "spontaneous" , teenager   Constipation    Depression    hx of   Diabetes mellitus without complication (HCC)    on meds   Dyslipidemia associated with type 2 diabetes mellitus (HCC) 02/10/2014   Glaucoma    Heart murmur  HSV-1 (herpes simplex virus 1) infection    Hypertension    on meds   Kidney stones    Meningitis    Scoliosis (and kyphoscoliosis), idiopathic      Past Surgical History:  Procedure Laterality Date   CHEST TUBE INSERTION     TYMPANOSTOMY TUBE PLACEMENT      Family History  Problem Relation  Age of Onset   Hyperlipidemia Mother    Hypertension Mother    Hyperlipidemia Father    Hypertension Father    CAD Father    Heart disease Father    Other Brother        colostomy    Alzheimer's disease Maternal Grandmother 74   Colon cancer Neg Hx    Colon polyps Neg Hx    Esophageal cancer Neg Hx    Rectal cancer Neg Hx    Stomach cancer Neg Hx    Pancreatic cancer Neg Hx      Social Connections: Unknown (05/22/2023)   Social Connection and Isolation Panel [NHANES]    Frequency of Communication with Friends and Family: Not on file    Frequency of Social Gatherings with Friends and Family: Not on file    Attends Religious Services: Not on file    Active Member of Clubs or Organizations: Patient declined    Attends Banker Meetings: Not on file    Marital Status: Patient declined      Current Outpatient Medications:    blood glucose meter kit and supplies KIT, Dispense based on patient and insurance preference. Use up to four times daily as directed. (FOR ICD-9 250.00, 250.01)., Disp: 1 each, Rfl: 0   Cyanocobalamin (VITAMIN B-12 PO), Take 1,200 mcg by mouth. Pt only taking twice a week, Disp: , Rfl:    fluticasone (FLONASE) 50 MCG/ACT nasal spray, Place 2 sprays into both nostrils daily., Disp: 16 g, Rfl: 2   glucose blood (ONETOUCH VERIO) test strip, Use as instructed to check blood sugar up to 3 times daily, Disp: 300 each, Rfl: 0   Magnesium 250 MG TABS, Take 1 tablet by mouth daily at 2 PM., Disp: , Rfl:    METAMUCIL FIBER PO, Take by mouth., Disp: , Rfl:    OneTouch Delica Lancets 33G MISC, Use daily as needed to check blood sugar., Disp: 100 each, Rfl: 2   aspirin 81 MG chewable tablet, Chew 81 mg by mouth daily. (Patient not taking: Reported on 08/10/2023), Disp: , Rfl:    Physical Exam:   BP (!) 170/95 (BP Location: Right Arm, Patient Position: Sitting, Cuff Size: Normal)   Pulse 97   Ht 5\' 4"  (1.626 m)   Wt 143 lb (64.9 kg)   SpO2 98%   BMI 24.55  kg/m   Salient findings:  CN II-XII intact Given history and complaints, ear microscopy was indicated and performed for evaluation with findings as below in physical exam section and in procedures. On eval, Bilateral EAC clear and TM intact with well pneumatized middle ear spaces; no pulsatile ME lesions noted Weber 512: mid Rinne 512: AC > BC b/l  Anterior rhinoscopy: Septum intact; bilateral inferior turbinates without significant hypertrophy No lesions of oral cavity/oropharynx; dentition fair No obviously palpable neck masses/lymphadenopathy/thyromegaly No respiratory distress or stridor Clearly audible right carotid bruit.  No change in tinnitus on neck compression or neck turning  Seprately Identifiable Procedures:  Procedure: Bilateral ear microscopy using microscope (CPT 92504) Pre-procedure diagnosis: pulsatile tinnitus, rule out middle ear mass Post-procedure diagnosis: same Indication:  Concern for middle ear mass; given patient's otologic complaints and history, for improved and comprehensive examination of external ear and tympanic membrane, bilateral otologic examination using microscope was performed  Procedure: Patient was placed semi-recumbent. Both ear canals were examined using the microscope with findings above. Patient tolerated the procedure well.   Impression & Plans:  Selena Holt is a 53 y.o. female with h/o spinal meningitis s/p right tymp tube (2005) now with:  1. Pulsatile tinnitus of right ear   2. Conductive hearing loss of right ear with unrestricted hearing of left ear   3. Stenosis of right carotid artery    Right pulsatile tinnitus with noted bruit over right carotid and some right carotid stenosis as well. We discussed the extensive possibilities/wide DDX for right pulsatile tinnitus. It appears that given audible bruit, would expect this to be the cause of her tinnitus given no other findings. Regardless, there are some other causes (esp given her  mild CHL on right ear) that we can rule out - SSCD, ME mass. Do not think this is paraganglioma or venous cause as this is not a hum but rather rapid pulsatile tinnitus. Do not think myoclonus. She does not have other symptoms.   As such, after dicussion of options, will do CT Temporal bones to mostly complete her workup. MRI did not show any other lesions although venous anatomy not optimally eval but after d/w pt, will hold off  - f/u 8-12 weeks with CT for hearing and tinnitus   See below regarding exact medications prescribed this encounter including dosages and route: No orders of the defined types were placed in this encounter.     Thank you for allowing me the opportunity to care for your patient. Please do not hesitate to contact me should you have any other questions.  Sincerely, Jovita Kussmaul, MD Otolarynoglogist (ENT), [facility] Phone: [phone] Fax: [phone]  08/10/2023, 10:15 PM   MDM:  Level 4 - 99204 Complexity/Problems addressed: mod - multiple chronic problems Data complexity: mod - independent review of MRI imaging; review of notes; ordering tests - Morbidity: low/mod  - Prescription Drug prescribed or managed: no

## 2023-08-11 NOTE — Progress Notes (Signed)
  [address], [address] [place], [place] [address] [phone]  Audiological Evaluation    Name: Selena Holt     DOB:   01/26/1971      MRN:   [id number]                                                                                     Service Date: 08/11/2023     Accompanied by: unaccompanied    Patient comes today after Dr. Allena Katz, ENT sent a referral for a hearing evaluation due to concerns with tinnitus.   Symptoms Yes Details  Hearing loss  [x]  Reports perceiving hearing to be worse in the right ear.  Tinnitus  [x]  Reports swooshing sound, it was initially in both ears but now is only in the right ear.  Ear pain/ Ear infections  []    Balance problems  []    Noise exposure  []    Previous ear surgeries  []    Family history  [x]  Reportedly father developed hearing loss in one ear after an accident.  Amplification  []    Other  [x]  Patient reports she had spinal meningitis and the right eardrum bursted around 2005 and was treated with a ventilation tube by Dr. Ezzard Standing.    Otoscopy: Right ear: Clear external ear canals and notable landmarks visualized on the tympanic membrane. Left ear:  Clear external ear canals and notable landmarks visualized on the tympanic membrane.  Tympanometry: Right ear: Type A- Normal external ear canal volume with normal middle ear pressure and tympanic membrane compliance Left ear: Type A- Normal external ear canal volume with normal middle ear pressure and tympanic membrane compliance  Pure tone Audiometry: Right ear- Borderline normal hearing from [phone] Hz.   Left ear-  Normal hearing from [phone] Hz.  There is a slight difference in between ears, worse for the right ear.   Speech Audiometry: Right ear- Speech Reception Threshold (SRT) was obtained at 25 dBHL Left ear-Speech Reception Threshold (SRT) was obtained at 15 dBHL  The hearing test results were completed under headphones and re-checked with inserts and results are deemed  to be of good reliability. Test technique:  conventional     Word Recognition Score Tested using NU-6 (MLV) Right ear: 100% was obtained at a presentation level of 60 dBHL with contralateral masking which is deemed as  excellent Left ear: 100% was obtained at a presentation level of 55 dBHL with contralateral masking which is deemed as  excellent            Recommendations: Follow up with ENT as scheduled for today. Return for a hearing evaluation if concerns with hearing changes arise or per MD recommendation. Consider various tinnitus strategies, including the use of a sound generator,  and/or tinnitus retraining therapy or visiting [facility] tinnitus clinic.   [name], AUD

## 2023-08-11 NOTE — Progress Notes (Unsigned)
Cardiology Office Note:    Date:  08/12/2023   ID:  Selena Holt, DOB [birth year], MRN [id number]  PCP:  Ardith Dark, MD   [facility] Providers Cardiologist:  None     Referring MD: Ardith Dark, MD   No chief complaint on file.   History of Present Illness:    Selena Holt is a 52 y.o. female is seen at the request of Dr [name] for cardiac evaluation.  She has a history of DM type 2, HTN, HLD. She was previously seen by Dr Odis Hollingshead in July 2022 for some tachycardia. Echo was normal. Coronary CTA was ordered but apparently never done.  She has a history of DM - was previously on metformin but last 2 years sugar ok so has not been on therapy. Has HLD last LDL 117. On no therapy. BP has been consistently high over past year. On no medication. States she has had high BP since a teenager. Reports fatigue on lisinopril in the past. She noted a persistent swishing sound in her right ear especially when lying on right side. Carotid dopplers in 2022 suggested 50-69% bilateral stenosis. Was seen by Dr Myra Gianotti this fall and CTA showed 50% right ICA stenosis. MRA showed no significant blockage. Plans follow up with [facility] in one year. She does note some claudication symptoms when walking up hill. Only chest pain she had is recent - a dull ache that lasted all day 3 weeks ago and now happens intermittently but again lasts for > an hour. Not associated with activity.   Past Medical History:  Diagnosis Date   Allergy    Anemia    hx of   Anxiety    hx of   Bipolar disorder (HCC)    Carotid artery occlusion    Collapsed lung    "spontaneous" , teenager   Constipation    Depression    hx of   Diabetes mellitus without complication (HCC)    on meds   Dyslipidemia associated with type 2 diabetes mellitus (HCC) 02/10/2014   Glaucoma    Heart murmur    HSV-1 (herpes simplex virus 1) infection    Hypertension    on meds   Kidney stones    Meningitis    Scoliosis (and  kyphoscoliosis), idiopathic     Past Surgical History:  Procedure Laterality Date   CHEST TUBE INSERTION     TYMPANOSTOMY TUBE PLACEMENT      Current Medications: Current Meds  Medication Sig   Ascorbic Acid (VITAMIN C) 100 MG CHEW    aspirin 81 MG chewable tablet Chew 81 mg by mouth daily.   blood glucose meter kit and supplies KIT Dispense based on patient and insurance preference. Use up to four times daily as directed. (FOR ICD-9 250.00, 250.01).   Cholecalciferol 1.25 MG (50000 UT) TABS Take 1 tablet by mouth daily.   Cyanocobalamin (VITAMIN B-12 PO) Take 1,200 mcg by mouth. Pt only taking twice a week   fluticasone (FLONASE) 50 MCG/ACT nasal spray Place 2 sprays into both nostrils daily.   glucose blood (ONETOUCH VERIO) test strip Use as instructed to check blood sugar up to 3 times daily   losartan (COZAAR) 25 MG tablet Take 1 tablet (25 mg total) by mouth daily.   Magnesium 250 MG TABS Take 1 tablet by mouth daily at 2 PM.   METAMUCIL FIBER PO Take by mouth.   OneTouch Delica Lancets 33G MISC Use daily as needed to check  blood sugar.   rosuvastatin (CRESTOR) 10 MG tablet Take 1 tablet (10 mg total) by mouth daily.     Allergies:   Tetanus toxoids, Ceclor [cefaclor], Codeine, Doxycycline, Penicillins, Sulfa antibiotics, and Lisinopril   Social History   Socioeconomic History   Marital status: Single    Spouse name: Not on file   Number of children: 2   Years of education: Not on file   Highest education level: Not on file  Occupational History   Occupation: clerical    Employer: [employer]    Comment: [employer]  Tobacco Use   Smoking status: Never   Smokeless tobacco: Never  Vaping Use   Vaping status: Never Used  Substance and Sexual Activity   Alcohol use: No    Alcohol/week: 0.0 standard drinks of alcohol   Drug use: No   Sexual activity: Never  Other Topics Concern   Not on file  Social History Narrative   She lives with her grown  son. Human resources for [employer]   Social Drivers of Health   Financial Resource Strain: Not on file  Food Insecurity: Low Risk  (05/22/2023)   Received from [facility]   Hunger Vital Sign    Worried About Running Out of Food in the Last Year: Never true    Ran Out of Food in the Last Year: Never true  Transportation Needs: Patient Declined (05/22/2023)   PRAPARE - Administrator, Civil Service (Medical): Patient declined    Lack of Transportation (Non-Medical): Patient declined  Physical Activity: Unknown (05/22/2023)   Exercise Vital Sign    Days of Exercise per Week: Patient declined    Minutes of Exercise per Session: Not on file  Stress: Not on file  Social Connections: Unknown (05/22/2023)   Social Connection and Isolation Panel [NHANES]    Frequency of Communication with Friends and Family: Not on file    Frequency of Social Gatherings with Friends and Family: Not on file    Attends Religious Services: Not on file    Active Member of Clubs or Organizations: Patient declined    Attends Banker Meetings: Not on file    Marital Status: Patient declined     Family History: The patient's family history includes Alzheimer's disease (age of onset: 100) in her maternal grandmother; CAD in her father; Heart disease in her maternal grandfather and paternal grandmother; Heart disease (age of onset: 24) in her father; Hyperlipidemia in her father and mother; Hypertension in her father and mother; Other in her brother. There is no history of Colon cancer, Colon polyps, Esophageal cancer, Rectal cancer, Stomach cancer, or Pancreatic cancer.  ROS:   Please see the history of present illness.     All other systems reviewed and are negative.  EKGs/Labs/Other Studies Reviewed:    The following studies were reviewed today:  EKG Interpretation Date/Time:  Wednesday August 12 2023 10:44:00 EST Ventricular Rate:  86 PR Interval:  130 QRS Duration:  76 QT  Interval:  358 QTC Calculation: 428 R Axis:   6  Text Interpretation: Normal sinus rhythm Nonspecific ST and T wave abnormality When compared with ECG of 29-Dec-2020 19:08, No significant change was found Confirmed by [place], Tanaya Dunigan [phone]) on 08/12/2023 10:48:55 AM   Echocardiogram 01/03/2021:  Left ventricle cavity is normal in size and wall thickness. Normal global  wall motion. Normal LV systolic function with EF 66%. Doppler evidence of  grade I (impaired) diastolic dysfunction, normal LAP. No significant  valvular abnormality.  Normal right atrial pressure.  EKG Interpretation Date/Time:  Wednesday August 12 2023 10:44:00 EST Ventricular Rate:  86 PR Interval:  130 QRS Duration:  76 QT Interval:  358 QTC Calculation: 428 R Axis:   6  Text Interpretation: Normal sinus rhythm Nonspecific ST and T wave abnormality When compared with ECG of 29-Dec-2020 19:08, No significant change was found Confirmed by [place], Juriel Cid [phone]) on 08/12/2023 10:48:55 AM    Recent Labs: 12/01/2022: ALT 31; TSH 3.19 06/11/2023: BUN 11; Creatinine, Ser 0.69; Hemoglobin 13.6; Platelets 253; Potassium 3.8; Sodium 135  Recent Lipid Panel    Component Value Date/Time   CHOL 198 09/01/2022 0747   TRIG 170.0 (H) 09/01/2022 0747   HDL 46.60 09/01/2022 0747   CHOLHDL 4 09/01/2022 0747   VLDL 34.0 09/01/2022 0747   LDLCALC 117 (H) 09/01/2022 0747   LDLCALC 102 (H) 03/08/2021 1507     Risk Assessment/Calculations:       Physical Exam:    VS:  BP (!) 160/90 (BP Location: Right Arm, Patient Position: Sitting, Cuff Size: Normal)   Pulse 86   Ht 5\' 4"  (1.626 m)   Wt 145 lb (65.8 kg)   SpO2 99%   BMI 24.89 kg/m     Wt Readings from Last 3 Encounters:  08/12/23 145 lb (65.8 kg)  08/10/23 143 lb (64.9 kg)  07/09/23 143 lb 12.8 oz (65.2 kg)     GEN:  Well nourished, well developed in no acute distress HEENT: Normal NECK: No JVD; loud right carotid bruit LYMPHATICS: No lymphadenopathy CARDIAC:  RRR, no murmurs, rubs, gallops RESPIRATORY:  Clear to auscultation without rales, wheezing or rhonchi  ABDOMEN: Soft, non-tender, non-distended MUSCULOSKELETAL:  No edema; No deformity  SKIN: Warm and dry NEUROLOGIC:  Alert and oriented x 3 PSYCHIATRIC:  Normal affect   ASSESSMENT:    1. Hypertension associated with diabetes (HCC)   2. Type 2 diabetes mellitus with complication, without long-term current use of insulin (HCC)   3. Stenosis of right carotid artery   4. Hypercholesterolemia   5. Claudication [facility])    PLAN:    In order of problems listed above:  Carotid artery disease with nonobstructive disease in RICA- loud bruit DM last A1c 6.8% HTN. Untreated. Goal < 130/80 HLD. Given DM and evidence of vascular disease needs goal LDL < 55 Mild claudication. Will arrange LE arterial dopplers Family history of early CAD  At present she does not have significant cardiac symptoms. She is at high risk based on above risk factors. Needs aggressive risk factor modification. Recommend heart healthy diet restricting simple sugars and carbs and low sodium. Recommend regular aerobic exercise aiming for 45 minutes 5 days/week. Will initiate BP therapy with losartan 25 mg daily. Start statin with Crestor 10 mg daily. Recommend close follow up with PCP for risk factor management with repeat lab work in 3 months. Instructed her to let us know if she does develop any new cardiac symptoms but will hold off on further ischemic work up for now unless she is symptomatic.   I will plan on follow up in one year           Medication Adjustments/Labs and Tests Ordered: Current medicines are reviewed at length with the patient today.  Concerns regarding medicines are outlined above.  Orders Placed This Encounter  Procedures   EKG 12-Lead   VAS [place] LOWER EXTREMITY ARTERIAL DUPLEX   Meds ordered this encounter  Medications   losartan (COZAAR) 25  MG tablet    Sig: Take 1 tablet (25 mg total) by  mouth daily.    Dispense:  90 tablet    Refill:  3   rosuvastatin (CRESTOR) 10 MG tablet    Sig: Take 1 tablet (10 mg total) by mouth daily.    Dispense:  90 tablet    Refill:  3    Patient Instructions  Medication Instructions:  Start Losartan 25 mg daily Start Crestor 10 mg daily Continue all other medications   Carbohydrate Counting for Diabetes Mellitus, Adult Carbohydrate counting is a method of keeping track of how many carbohydrates you eat. Eating carbohydrates increases the amount of sugar (glucose) in the blood. Counting how many carbohydrates you eat improves how well you manage your blood glucose. This, in turn, helps you manage your diabetes. Carbohydrates are measured in grams (g) per serving. It is important to know how many carbohydrates (in grams or by serving size) you can have in each meal. This is different for every person. A dietitian can help you make a meal plan and calculate how many carbohydrates you should have at each meal and snack. What foods contain carbohydrates? Carbohydrates are found in the following foods: Grains, such as breads and cereals. Dried beans and soy products. Starchy vegetables, such as potatoes, peas, and corn. Fruit and fruit juices. Milk and yogurt. Sweets and snack foods, such as cake, cookies, candy, chips, and soft drinks. How do I count carbohydrates in foods? There are two ways to count carbohydrates in food. You can read food labels or learn standard serving sizes of foods. You can use either of these methods or a combination of both. Using the Nutrition Facts label The Nutrition Facts list is included on the labels of almost all packaged foods and beverages in the [place]. It includes: The serving size. Information about nutrients in each serving, including the grams of carbohydrate per serving. To use the Nutrition Facts, decide how many servings you will have. Then, multiply the number of servings by the number of  carbohydrates per serving. The resulting number is the total grams of carbohydrates that you will be having. Learning the standard serving sizes of foods When you eat carbohydrate foods that are not packaged or do not include Nutrition Facts on the label, you need to measure the servings in order to count the grams of carbohydrates. Measure the foods that you will eat with a food scale or measuring cup, if needed. Decide how many standard-size servings you will eat. Multiply the number of servings by 15. For foods that contain carbohydrates, one serving equals 15 g of carbohydrates. For example, if you eat 2 cups or 10 oz (300 g) of strawberries, you will have eaten 2 servings and 30 g of carbohydrates (2 servings x 15 g = 30 g). For foods that have more than one food mixed, such as soups and casseroles, you must count the carbohydrates in each food that is included. The following list contains standard serving sizes of common carbohydrate-rich foods. Each of these servings has about 15 g of carbohydrates: 1 slice of bread. 1 six-inch (15 cm) tortilla. ? cup or 2 oz (53 g) cooked rice or pasta.  cup or 3 oz (85 g) cooked or canned, drained and rinsed beans or lentils.  cup or 3 oz (85 g) starchy vegetable, such as peas, corn, or squash.  cup or 4 oz (120 g) hot cereal.  cup or 3 oz (85 g) boiled or mashed  potatoes, or  or 3 oz (85 g) of a large baked potato.  cup or 4 fl oz (118 mL) fruit juice. 1 cup or 8 fl oz (237 mL) milk. 1 small or 4 oz (106 g) apple.  or 2 oz (63 g) of a medium banana. 1 cup or 5 oz (150 g) strawberries. 3 cups or 1 oz (28.3 g) popped popcorn. What is an example of carbohydrate counting? To calculate the grams of carbohydrates in this sample meal, follow the steps shown below. Sample meal 3 oz (85 g) chicken breast. ? cup or 4 oz (106 g) brown rice.  cup or 3 oz (85 g) corn. 1 cup or 8 fl oz (237 mL) milk. 1 cup or 5 oz (150 g) strawberries with sugar-free  whipped topping. Carbohydrate calculation Identify the foods that contain carbohydrates: Rice. Corn. Milk. Strawberries. Calculate how many servings you have of each food: 2 servings rice. 1 serving corn. 1 serving milk. 1 serving strawberries. Multiply each number of servings by 15 g: 2 servings rice x 15 g = 30 g. 1 serving corn x 15 g = 15 g. 1 serving milk x 15 g = 15 g. 1 serving strawberries x 15 g = 15 g. Add together all of the amounts to find the total grams of carbohydrates eaten: 30 g + 15 g + 15 g + 15 g = 75 g of carbohydrates total. What are tips for following this plan? Shopping Develop a meal plan and then make a shopping list. Buy fresh and frozen vegetables, fresh and frozen fruit, dairy, eggs, beans, lentils, and whole grains. Look at food labels. Choose foods that have more fiber and less sugar. Avoid processed foods and foods with added sugars. Meal planning Aim to have the same number of grams of carbohydrates at each meal and for each snack time. Plan to have regular, balanced meals and snacks. Where to find more information American Diabetes Association: diabetes.org Centers for Disease Control and Prevention: TonerPromos.no Academy of Nutrition and Dietetics: eatright.org Association of Diabetes Care & Education Specialists: diabeteseducator.org Summary Carbohydrate counting is a method of keeping track of how many carbohydrates you eat. Eating carbohydrates increases the amount of sugar (glucose) in your blood. Counting how many carbohydrates you eat improves how well you manage your blood glucose. This helps you manage your diabetes. A dietitian can help you make a meal plan and calculate how many carbohydrates you should have at each meal and snack. This information is not intended to replace advice given to you by your health care provider. Make sure you discuss any questions you have with your health care provider. Document Revised: 02/07/2020 Document  Reviewed: 02/08/2020 Elsevier Patient Education  2024 Elsevier Inc.Low-Sodium Eating Plan Salt (sodium) helps you keep a healthy balance of fluids in your body. Too much sodium can raise your blood pressure. It can also cause fluid and waste to be held in your body. Your health care provider or dietitian may recommend a low-sodium eating plan if you have high blood pressure (hypertension), kidney disease, liver disease, or heart failure. Eating less sodium can help lower your blood pressure and reduce swelling. It can also protect your heart, liver, and kidneys. What are tips for following this plan? Reading food labels  Check food labels for the amount of sodium per serving. If you eat more than one serving, you must multiply the listed amount by the number of servings. Choose foods with less than 140 milligrams (mg) of sodium  per serving. Avoid foods with 300 mg of sodium or more per serving. Always check how much sodium is in a product, even if the label says "unsalted" or "no salt added." Shopping  Buy products labeled as "low-sodium" or "no salt added." Buy fresh foods. Avoid canned foods and pre-made or frozen meals. Avoid canned, cured, or processed meats. Buy breads that have less than 80 mg of sodium per slice. Cooking  Eat more home-cooked food. Try to eat less restaurant, buffet, and fast food. Try not to add salt when you cook. Use salt-free seasonings or herbs instead of table salt or sea salt. Check with your provider or pharmacist before using salt substitutes. Cook with plant-based oils, such as canola, sunflower, or olive oil. Meal planning When eating at a restaurant, ask if your food can be made with less salt or no salt. Avoid dishes labeled as brined, pickled, cured, or smoked. Avoid dishes made with soy sauce, miso, or teriyaki sauce. Avoid foods that have monosodium glutamate (MSG) in them. MSG may be added to some restaurant food, sauces, soups, bouillon, and canned  foods. Make meals that can be grilled, baked, poached, roasted, or steamed. These are often made with less sodium. General information Try to limit your sodium intake to 1,500-2,300 mg each day, or the amount told by your provider. What foods should I eat? Fruits Fresh, frozen, or canned fruit. Fruit juice. Vegetables Fresh or frozen vegetables. "No salt added" canned vegetables. "No salt added" tomato sauce and paste. Low-sodium or reduced-sodium tomato and vegetable juice. Grains Low-sodium cereals, such as oats, puffed wheat and rice, and shredded wheat. Low-sodium crackers. Unsalted rice. Unsalted pasta. Low-sodium bread. Whole grain breads and whole grain pasta. Meats and other proteins Fresh or frozen meat, poultry, seafood, and fish. These should have no added salt. Low-sodium canned tuna and salmon. Unsalted nuts. Dried peas, beans, and lentils without added salt. Unsalted canned beans. Eggs. Unsalted nut butters. Dairy Milk. Soy milk. Cheese that is naturally low in sodium, such as ricotta cheese, fresh mozzarella, or Swiss cheese. Low-sodium or reduced-sodium cheese. Cream cheese. Yogurt. Seasonings and condiments Fresh and dried herbs and spices. Salt-free seasonings. Low-sodium mustard and ketchup. Sodium-free salad dressing. Sodium-free light mayonnaise. Fresh or refrigerated horseradish. Lemon juice. Vinegar. Other foods Homemade, reduced-sodium, or low-sodium soups. Unsalted popcorn and pretzels. Low-salt or salt-free chips. The items listed above may not be all the foods and drinks you can have. Talk to a dietitian to learn more. What foods should I avoid? Vegetables Sauerkraut, pickled vegetables, and relishes. Olives. [place] fries. Onion rings. Regular canned vegetables, except low-sodium or reduced-sodium items. Regular canned tomato sauce and paste. Regular tomato and vegetable juice. Frozen vegetables in sauces. Grains Instant hot cereals. Bread stuffing, pancake, and  biscuit mixes. Croutons. Seasoned rice or pasta mixes. Noodle soup cups. Boxed or frozen macaroni and cheese. Regular salted crackers. Self-rising flour. Meats and other proteins Meat or fish that is salted, canned, smoked, spiced, or pickled. Precooked or cured meat, such as sausages or meat loaves. Tomasa Blase. Ham. Pepperoni. Hot dogs. Corned beef. Chipped beef. Salt pork. Jerky. Pickled herring, anchovies, and sardines. Regular canned tuna. Salted nuts. Dairy Processed cheese and cheese spreads. Hard cheeses. Cheese curds. Blue cheese. Feta cheese. String cheese. Regular cottage cheese. Buttermilk. Canned milk. Fats and oils Salted butter. Regular margarine. Ghee. Bacon fat. Seasonings and condiments Onion salt, garlic salt, seasoned salt, table salt, and sea salt. Canned and packaged gravies. Worcestershire sauce. Tartar sauce. Barbecue sauce. Teriyaki  sauce. Soy sauce, including reduced-sodium soy sauce. Steak sauce. Fish sauce. Oyster sauce. Cocktail sauce. Horseradish that you find on the shelf. Regular ketchup and mustard. Meat flavorings and tenderizers. Bouillon cubes. Hot sauce. Pre-made or packaged marinades. Pre-made or packaged taco seasonings. Relishes. Regular salad dressings. Salsa. Other foods Salted popcorn and pretzels. Corn chips and puffs. Potato and tortilla chips. Canned or dried soups. Pizza. Frozen entrees and pot pies. The items listed above may not be all the foods and drinks you should avoid. Talk to a dietitian to learn more. This information is not intended to replace advice given to you by your health care provider. Make sure you discuss any questions you have with your health care provider. Document Revised: 07/24/2022 Document Reviewed: 07/24/2022 Elsevier Patient Education  2024 Elsevier Inc. *If you need a refill on your cardiac medications before your next appointment, please call your pharmacy*   Lab Work: Lab in 3 months with PCP   Testing/Procedures: Lower  Ext Arterial Dopplers  first available    Follow-Up: At [facility], you and your health needs are our priority.  As part of our continuing mission to provide you with exceptional heart care, we have created designated Provider Care Teams.  These Care Teams include your primary Cardiologist (physician) and Advanced Practice Providers (APPs -  Physician Assistants and Nurse Practitioners) who all work together to provide you with the care you need, when you need it.  We recommend signing up for the patient portal called "MyChart".  Sign up information is provided on this After Visit Summary.  MyChart is used to connect with patients for Virtual Visits (Telemedicine).  Patients are able to view lab/test results, encounter notes, upcoming appointments, etc.  Non-urgent messages can be sent to your provider as well.   To learn more about what you can do with MyChart, go to [email or web address].    Your next appointment:  1 year     Call in Oct to schedule Jan appointment   [facility] )    Provider:  Dr.Taite Schoeppner          Signed, Cami Delawder [place], MD  08/12/2023 11:36 AM    [facility]

## 2023-08-12 ENCOUNTER — Ambulatory Visit (HOSPITAL_BASED_OUTPATIENT_CLINIC_OR_DEPARTMENT_OTHER): Payer: Federal, State, Local not specified - PPO | Admitting: Cardiology

## 2023-08-12 ENCOUNTER — Encounter: Payer: Self-pay | Admitting: Cardiology

## 2023-08-12 ENCOUNTER — Ambulatory Visit: Payer: Federal, State, Local not specified - PPO | Attending: Cardiology | Admitting: Cardiology

## 2023-08-12 VITALS — BP 160/90 | HR 86 | Ht 64.0 in | Wt 145.0 lb

## 2023-08-12 DIAGNOSIS — E7800 Pure hypercholesterolemia, unspecified: Secondary | ICD-10-CM | POA: Diagnosis not present

## 2023-08-12 DIAGNOSIS — E1159 Type 2 diabetes mellitus with other circulatory complications: Secondary | ICD-10-CM | POA: Diagnosis not present

## 2023-08-12 DIAGNOSIS — I152 Hypertension secondary to endocrine disorders: Secondary | ICD-10-CM | POA: Diagnosis not present

## 2023-08-12 DIAGNOSIS — I6521 Occlusion and stenosis of right carotid artery: Secondary | ICD-10-CM

## 2023-08-12 DIAGNOSIS — E118 Type 2 diabetes mellitus with unspecified complications: Secondary | ICD-10-CM | POA: Diagnosis not present

## 2023-08-12 DIAGNOSIS — I739 Peripheral vascular disease, unspecified: Secondary | ICD-10-CM

## 2023-08-12 MED ORDER — LOSARTAN POTASSIUM 25 MG PO TABS: 25.0000 mg | ORAL_TABLET | Freq: Every day | ORAL | 3 refills | Status: DC

## 2023-08-12 MED ORDER — ROSUVASTATIN CALCIUM 10 MG PO TABS
10.0000 mg | ORAL_TABLET | Freq: Every day | ORAL | 3 refills | Status: DC
Start: 2023-08-12 — End: 2023-10-08

## 2023-08-12 NOTE — Patient Instructions (Signed)
Medication Instructions:  Start Losartan 25 mg daily Start Crestor 10 mg daily Continue all other medications   Carbohydrate Counting for Diabetes Mellitus, Adult Carbohydrate counting is a method of keeping track of how many carbohydrates you eat. Eating carbohydrates increases the amount of sugar (glucose) in the blood. Counting how many carbohydrates you eat improves how well you manage your blood glucose. This, in turn, helps you manage your diabetes. Carbohydrates are measured in grams (g) per serving. It is important to know how many carbohydrates (in grams or by serving size) you can have in each meal. This is different for every person. A dietitian can help you make a meal plan and calculate how many carbohydrates you should have at each meal and snack. What foods contain carbohydrates? Carbohydrates are found in the following foods: Grains, such as breads and cereals. Dried beans and soy products. Starchy vegetables, such as potatoes, peas, and corn. Fruit and fruit juices. Milk and yogurt. Sweets and snack foods, such as cake, cookies, candy, chips, and soft drinks. How do I count carbohydrates in foods? There are two ways to count carbohydrates in food. You can read food labels or learn standard serving sizes of foods. You can use either of these methods or a combination of both. Using the Nutrition Facts label The Nutrition Facts list is included on the labels of almost all packaged foods and beverages in the [place]. It includes: The serving size. Information about nutrients in each serving, including the grams of carbohydrate per serving. To use the Nutrition Facts, decide how many servings you will have. Then, multiply the number of servings by the number of carbohydrates per serving. The resulting number is the total grams of carbohydrates that you will be having. Learning the standard serving sizes of foods When you eat carbohydrate foods that are not packaged or do  not include Nutrition Facts on the label, you need to measure the servings in order to count the grams of carbohydrates. Measure the foods that you will eat with a food scale or measuring cup, if needed. Decide how many standard-size servings you will eat. Multiply the number of servings by 15. For foods that contain carbohydrates, one serving equals 15 g of carbohydrates. For example, if you eat 2 cups or 10 oz (300 g) of strawberries, you will have eaten 2 servings and 30 g of carbohydrates (2 servings x 15 g = 30 g). For foods that have more than one food mixed, such as soups and casseroles, you must count the carbohydrates in each food that is included. The following list contains standard serving sizes of common carbohydrate-rich foods. Each of these servings has about 15 g of carbohydrates: 1 slice of bread. 1 six-inch (15 cm) tortilla. ? cup or 2 oz (53 g) cooked rice or pasta.  cup or 3 oz (85 g) cooked or canned, drained and rinsed beans or lentils.  cup or 3 oz (85 g) starchy vegetable, such as peas, corn, or squash.  cup or 4 oz (120 g) hot cereal.  cup or 3 oz (85 g) boiled or mashed potatoes, or  or 3 oz (85 g) of a large baked potato.  cup or 4 fl oz (118 mL) fruit juice. 1 cup or 8 fl oz (237 mL) milk. 1 small or 4 oz (106 g) apple.  or 2 oz (63 g) of a medium banana. 1 cup or 5 oz (150 g) strawberries. 3 cups or 1 oz (28.3 g) popped popcorn. What  is an example of carbohydrate counting? To calculate the grams of carbohydrates in this sample meal, follow the steps shown below. Sample meal 3 oz (85 g) chicken breast. ? cup or 4 oz (106 g) brown rice.  cup or 3 oz (85 g) corn. 1 cup or 8 fl oz (237 mL) milk. 1 cup or 5 oz (150 g) strawberries with sugar-free whipped topping. Carbohydrate calculation Identify the foods that contain carbohydrates: Rice. Corn. Milk. Strawberries. Calculate how many servings you have of each food: 2 servings rice. 1 serving  corn. 1 serving milk. 1 serving strawberries. Multiply each number of servings by 15 g: 2 servings rice x 15 g = 30 g. 1 serving corn x 15 g = 15 g. 1 serving milk x 15 g = 15 g. 1 serving strawberries x 15 g = 15 g. Add together all of the amounts to find the total grams of carbohydrates eaten: 30 g + 15 g + 15 g + 15 g = 75 g of carbohydrates total. What are tips for following this plan? Shopping Develop a meal plan and then make a shopping list. Buy fresh and frozen vegetables, fresh and frozen fruit, dairy, eggs, beans, lentils, and whole grains. Look at food labels. Choose foods that have more fiber and less sugar. Avoid processed foods and foods with added sugars. Meal planning Aim to have the same number of grams of carbohydrates at each meal and for each snack time. Plan to have regular, balanced meals and snacks. Where to find more information American Diabetes Association: diabetes.org Centers for Disease Control and Prevention: TonerPromos.no Academy of Nutrition and Dietetics: eatright.org Association of Diabetes Care & Education Specialists: diabeteseducator.org Summary Carbohydrate counting is a method of keeping track of how many carbohydrates you eat. Eating carbohydrates increases the amount of sugar (glucose) in your blood. Counting how many carbohydrates you eat improves how well you manage your blood glucose. This helps you manage your diabetes. A dietitian can help you make a meal plan and calculate how many carbohydrates you should have at each meal and snack. This information is not intended to replace advice given to you by your health care provider. Make sure you discuss any questions you have with your health care provider. Document Revised: 02/07/2020 Document Reviewed: 02/08/2020 Elsevier Patient Education  2024 Elsevier Inc.Low-Sodium Eating Plan Salt (sodium) helps you keep a healthy balance of fluids in your body. Too much sodium can raise your blood pressure.  It can also cause fluid and waste to be held in your body. Your health care provider or dietitian may recommend a low-sodium eating plan if you have high blood pressure (hypertension), kidney disease, liver disease, or heart failure. Eating less sodium can help lower your blood pressure and reduce swelling. It can also protect your heart, liver, and kidneys. What are tips for following this plan? Reading food labels  Check food labels for the amount of sodium per serving. If you eat more than one serving, you must multiply the listed amount by the number of servings. Choose foods with less than 140 milligrams (mg) of sodium per serving. Avoid foods with 300 mg of sodium or more per serving. Always check how much sodium is in a product, even if the label says "unsalted" or "no salt added." Shopping  Buy products labeled as "low-sodium" or "no salt added." Buy fresh foods. Avoid canned foods and pre-made or frozen meals. Avoid canned, cured, or processed meats. Buy breads that have less than 80 mg  of sodium per slice. Cooking  Eat more home-cooked food. Try to eat less restaurant, buffet, and fast food. Try not to add salt when you cook. Use salt-free seasonings or herbs instead of table salt or sea salt. Check with your provider or pharmacist before using salt substitutes. Cook with plant-based oils, such as canola, sunflower, or olive oil. Meal planning When eating at a restaurant, ask if your food can be made with less salt or no salt. Avoid dishes labeled as brined, pickled, cured, or smoked. Avoid dishes made with soy sauce, miso, or teriyaki sauce. Avoid foods that have monosodium glutamate (MSG) in them. MSG may be added to some restaurant food, sauces, soups, bouillon, and canned foods. Make meals that can be grilled, baked, poached, roasted, or steamed. These are often made with less sodium. General information Try to limit your sodium intake to 1,500-2,300 mg each day, or the amount  told by your provider. What foods should I eat? Fruits Fresh, frozen, or canned fruit. Fruit juice. Vegetables Fresh or frozen vegetables. "No salt added" canned vegetables. "No salt added" tomato sauce and paste. Low-sodium or reduced-sodium tomato and vegetable juice. Grains Low-sodium cereals, such as oats, puffed wheat and rice, and shredded wheat. Low-sodium crackers. Unsalted rice. Unsalted pasta. Low-sodium bread. Whole grain breads and whole grain pasta. Meats and other proteins Fresh or frozen meat, poultry, seafood, and fish. These should have no added salt. Low-sodium canned tuna and salmon. Unsalted nuts. Dried peas, beans, and lentils without added salt. Unsalted canned beans. Eggs. Unsalted nut butters. Dairy Milk. Soy milk. Cheese that is naturally low in sodium, such as ricotta cheese, fresh mozzarella, or Swiss cheese. Low-sodium or reduced-sodium cheese. Cream cheese. Yogurt. Seasonings and condiments Fresh and dried herbs and spices. Salt-free seasonings. Low-sodium mustard and ketchup. Sodium-free salad dressing. Sodium-free light mayonnaise. Fresh or refrigerated horseradish. Lemon juice. Vinegar. Other foods Homemade, reduced-sodium, or low-sodium soups. Unsalted popcorn and pretzels. Low-salt or salt-free chips. The items listed above may not be all the foods and drinks you can have. Talk to a dietitian to learn more. What foods should I avoid? Vegetables Sauerkraut, pickled vegetables, and relishes. Olives. [place] fries. Onion rings. Regular canned vegetables, except low-sodium or reduced-sodium items. Regular canned tomato sauce and paste. Regular tomato and vegetable juice. Frozen vegetables in sauces. Grains Instant hot cereals. Bread stuffing, pancake, and biscuit mixes. Croutons. Seasoned rice or pasta mixes. Noodle soup cups. Boxed or frozen macaroni and cheese. Regular salted crackers. Self-rising flour. Meats and other proteins Meat or fish that is salted,  canned, smoked, spiced, or pickled. Precooked or cured meat, such as sausages or meat loaves. Tomasa Blase. Ham. Pepperoni. Hot dogs. Corned beef. Chipped beef. Salt pork. Jerky. Pickled herring, anchovies, and sardines. Regular canned tuna. Salted nuts. Dairy Processed cheese and cheese spreads. Hard cheeses. Cheese curds. Blue cheese. Feta cheese. String cheese. Regular cottage cheese. Buttermilk. Canned milk. Fats and oils Salted butter. Regular margarine. Ghee. Bacon fat. Seasonings and condiments Onion salt, garlic salt, seasoned salt, table salt, and sea salt. Canned and packaged gravies. Worcestershire sauce. Tartar sauce. Barbecue sauce. Teriyaki sauce. Soy sauce, including reduced-sodium soy sauce. Steak sauce. Fish sauce. Oyster sauce. Cocktail sauce. Horseradish that you find on the shelf. Regular ketchup and mustard. Meat flavorings and tenderizers. Bouillon cubes. Hot sauce. Pre-made or packaged marinades. Pre-made or packaged taco seasonings. Relishes. Regular salad dressings. Salsa. Other foods Salted popcorn and pretzels. Corn chips and puffs. Potato and tortilla chips. Canned or dried soups. Pizza. Frozen  entrees and pot pies. The items listed above may not be all the foods and drinks you should avoid. Talk to a dietitian to learn more. This information is not intended to replace advice given to you by your health care provider. Make sure you discuss any questions you have with your health care provider. Document Revised: 07/24/2022 Document Reviewed: 07/24/2022 Elsevier Patient Education  2024 Elsevier Inc. *If you need a refill on your cardiac medications before your next appointment, please call your pharmacy*   Lab Work: Lab in 3 months with PCP   Testing/Procedures: Lower Ext Arterial Dopplers  first available    Follow-Up: At [facility], you and your health needs are our priority.  As part of our continuing mission to provide you with exceptional heart care, we  have created designated Provider Care Teams.  These Care Teams include your primary Cardiologist (physician) and Advanced Practice Providers (APPs -  Physician Assistants and Nurse Practitioners) who all work together to provide you with the care you need, when you need it.  We recommend signing up for the patient portal called "MyChart".  Sign up information is provided on this After Visit Summary.  MyChart is used to connect with patients for Virtual Visits (Telemedicine).  Patients are able to view lab/test results, encounter notes, upcoming appointments, etc.  Non-urgent messages can be sent to your provider as well.   To learn more about what you can do with MyChart, go to [email or web address].    Your next appointment:  1 year     Call in Oct to schedule Jan appointment   [facility] )    Provider:  Dr.Jordan

## 2023-08-17 ENCOUNTER — Encounter: Payer: Federal, State, Local not specified - PPO | Admitting: Surgery

## 2023-08-21 ENCOUNTER — Ambulatory Visit (HOSPITAL_BASED_OUTPATIENT_CLINIC_OR_DEPARTMENT_OTHER): Payer: Federal, State, Local not specified - PPO

## 2023-08-31 ENCOUNTER — Other Ambulatory Visit: Payer: Self-pay | Admitting: Cardiology

## 2023-08-31 DIAGNOSIS — I739 Peripheral vascular disease, unspecified: Secondary | ICD-10-CM

## 2023-08-31 DIAGNOSIS — I6521 Occlusion and stenosis of right carotid artery: Secondary | ICD-10-CM

## 2023-08-31 DIAGNOSIS — E118 Type 2 diabetes mellitus with unspecified complications: Secondary | ICD-10-CM

## 2023-08-31 DIAGNOSIS — I152 Hypertension secondary to endocrine disorders: Secondary | ICD-10-CM

## 2023-08-31 DIAGNOSIS — E7800 Pure hypercholesterolemia, unspecified: Secondary | ICD-10-CM

## 2023-09-07 ENCOUNTER — Encounter (HOSPITAL_COMMUNITY): Payer: Federal, State, Local not specified - PPO

## 2023-09-07 DIAGNOSIS — Z01419 Encounter for gynecological examination (general) (routine) without abnormal findings: Secondary | ICD-10-CM | POA: Diagnosis not present

## 2023-09-07 DIAGNOSIS — Z6824 Body mass index (BMI) 24.0-24.9, adult: Secondary | ICD-10-CM | POA: Diagnosis not present

## 2023-09-07 DIAGNOSIS — Z1151 Encounter for screening for human papillomavirus (HPV): Secondary | ICD-10-CM | POA: Diagnosis not present

## 2023-09-07 DIAGNOSIS — Z124 Encounter for screening for malignant neoplasm of cervix: Secondary | ICD-10-CM | POA: Diagnosis not present

## 2023-09-10 ENCOUNTER — Ambulatory Visit (HOSPITAL_COMMUNITY)
Admission: RE | Admit: 2023-09-10 | Payer: Federal, State, Local not specified - PPO | Source: Ambulatory Visit | Attending: Cardiology | Admitting: Cardiology

## 2023-09-10 DIAGNOSIS — F4310 Post-traumatic stress disorder, unspecified: Secondary | ICD-10-CM | POA: Diagnosis not present

## 2023-10-02 ENCOUNTER — Encounter (HOSPITAL_COMMUNITY): Payer: Self-pay | Admitting: Cardiology

## 2023-10-02 ENCOUNTER — Ambulatory Visit (HOSPITAL_COMMUNITY)
Admission: RE | Admit: 2023-10-02 | Discharge: 2023-10-02 | Disposition: A | Discharge status: Home / Self Care | Payer: Federal, State, Local not specified - PPO | Source: Ambulatory Visit | Attending: Cardiology | Admitting: Cardiology

## 2023-10-02 DIAGNOSIS — I739 Peripheral vascular disease, unspecified: Secondary | ICD-10-CM | POA: Diagnosis not present

## 2023-10-02 DIAGNOSIS — E118 Type 2 diabetes mellitus with unspecified complications: Secondary | ICD-10-CM | POA: Insufficient documentation

## 2023-10-02 DIAGNOSIS — I6521 Occlusion and stenosis of right carotid artery: Secondary | ICD-10-CM

## 2023-10-02 DIAGNOSIS — E1159 Type 2 diabetes mellitus with other circulatory complications: Secondary | ICD-10-CM | POA: Insufficient documentation

## 2023-10-02 DIAGNOSIS — I152 Hypertension secondary to endocrine disorders: Secondary | ICD-10-CM

## 2023-10-02 DIAGNOSIS — E7800 Pure hypercholesterolemia, unspecified: Secondary | ICD-10-CM | POA: Insufficient documentation

## 2023-10-02 LAB — VAS US ABI WITH/WO TBI
Left ABI: 1.01
Right ABI: 1.1

## 2023-10-03 ENCOUNTER — Encounter: Payer: Self-pay | Admitting: *Deleted

## 2023-10-03 ENCOUNTER — Ambulatory Visit
Admission: EM | Admit: 2023-10-03 | Discharge: 2023-10-03 | Disposition: A | Discharge status: Home / Self Care | Attending: Family Medicine | Admitting: Family Medicine

## 2023-10-03 DIAGNOSIS — R300 Dysuria: Secondary | ICD-10-CM | POA: Insufficient documentation

## 2023-10-03 DIAGNOSIS — N390 Urinary tract infection, site not specified: Secondary | ICD-10-CM | POA: Diagnosis not present

## 2023-10-03 DIAGNOSIS — N760 Acute vaginitis: Secondary | ICD-10-CM | POA: Insufficient documentation

## 2023-10-03 LAB — POCT URINALYSIS DIP (MANUAL ENTRY)
Bilirubin, UA: NEGATIVE
Blood, UA: NEGATIVE
Glucose, UA: NEGATIVE mg/dL
Ketones, POC UA: NEGATIVE mg/dL
Leukocytes, UA: NEGATIVE
Nitrite, UA: NEGATIVE
Protein Ur, POC: NEGATIVE mg/dL
Spec Grav, UA: <=1.005 — AB (ref 1.010–1.025)
Urobilinogen, UA: 0.2 U/dL
pH, UA: 5.5 (ref 5.0–8.0)

## 2023-10-03 MED ORDER — FLUCONAZOLE 150 MG PO TABS: 150.0000 mg | ORAL_TABLET | ORAL | 0 refills | Status: DC

## 2023-10-03 MED ORDER — CIPROFLOXACIN HCL 500 MG PO TABS: 500.0000 mg | ORAL_TABLET | Freq: Two times a day (BID) | ORAL | 0 refills | Status: DC

## 2023-10-03 NOTE — ED Triage Notes (Signed)
 Patient states she has had dysuria and frequency for 1 week.  Has noticed change in discharge but no concern for STI. OTC test + for UTI and slight yeast, wants to do swab for yeast and BV. Hasn't taken anything for symptoms.

## 2023-10-03 NOTE — Discharge Instructions (Addendum)
 Please start ciprofloxacin to address an urinary tract infection. Use fluconazole to address yeast infection. Make sure you hydrate very well with plain water and a quantity of 80 ounces of water a day.  Please limit drinks that are considered urinary irritants such as soda, sweet tea, coffee, energy drinks, alcohol.  These can worsen your urinary and genital symptoms but also be the source of them.  I will let you know about your urine culture and vaginal swab results through MyChart to see if we need to prescribe or change your antibiotics based off of those results.

## 2023-10-03 NOTE — ED Provider Notes (Signed)
 [facility]  Note:  This document was prepared using Conservation officer, historic buildings and may include unintentional dictation errors.  MRN: [id number] DOB: 1970-09-29  Subjective:   Selena Holt is a 53 y.o. female presenting for 1 week history of persistent dysuria, urinary frequency and urgency. Has had slight discharge as well. Has had UTIs, BV and yeast infection in the past.  No concern for an STI. Last A1c was 6.8% in 05/2023.  Tries to hydrate well but also drinks diet sodas regularly and daily.  No current facility-administered medications for this encounter.  Current Outpatient Medications:    Ascorbic Acid (VITAMIN C) 100 MG CHEW, , Disp: , Rfl:    aspirin 81 MG chewable tablet, Chew 81 mg by mouth daily., Disp: , Rfl:    blood glucose meter kit and supplies KIT, Dispense based on patient and insurance preference. Use up to four times daily as directed. (FOR ICD-9 250.00, 250.01)., Disp: 1 each, Rfl: 0   Cholecalciferol 1.25 MG (50000 UT) TABS, Take 1 tablet by mouth daily., Disp: , Rfl:    Cyanocobalamin (VITAMIN B-12 PO), Take 1,200 mcg by mouth. Pt only taking twice a week, Disp: , Rfl:    fluticasone (FLONASE) 50 MCG/ACT nasal spray, Place 2 sprays into both nostrils daily., Disp: 16 g, Rfl: 2   glucose blood (ONETOUCH VERIO) test strip, Use as instructed to check blood sugar up to 3 times daily, Disp: 300 each, Rfl: 0   losartan (COZAAR) 25 MG tablet, Take 1 tablet (25 mg total) by mouth daily., Disp: 90 tablet, Rfl: 3   Magnesium 250 MG TABS, Take 1 tablet by mouth daily at 2 PM., Disp: , Rfl:    METAMUCIL FIBER PO, Take by mouth., Disp: , Rfl:    OneTouch Delica Lancets 33G MISC, Use daily as needed to check blood sugar., Disp: 100 each, Rfl: 2   rosuvastatin (CRESTOR) 10 MG tablet, Take 1 tablet (10 mg total) by mouth daily., Disp: 90 tablet, Rfl: 3   Allergies  Allergen Reactions   Tetanus Toxoids Anaphylaxis    tetanus   Ceclor  [Cefaclor] Hives   Codeine Hives   Doxycycline Other (See Comments)    Hives   Penicillins Hives   Sulfa Antibiotics Hives   Lisinopril Other (See Comments)    fatigue    Past Medical History:  Diagnosis Date   Allergy    Anemia    hx of   Anxiety    hx of   Bipolar disorder (HCC)    Carotid artery occlusion    Collapsed lung    "spontaneous" , teenager   Constipation    Depression    hx of   Diabetes mellitus without complication (HCC)    on meds   Dyslipidemia associated with type 2 diabetes mellitus (HCC) 02/10/2014   Glaucoma    Heart murmur    HSV-1 (herpes simplex virus 1) infection    Hypertension    on meds   Kidney stones    Meningitis    Scoliosis (and kyphoscoliosis), idiopathic      Past Surgical History:  Procedure Laterality Date   CHEST TUBE INSERTION     TYMPANOSTOMY TUBE PLACEMENT      Family History  Problem Relation Age of Onset   Hyperlipidemia Mother    Hypertension Mother    Hyperlipidemia Father    Hypertension Father    CAD Father    Heart disease Father 68  coronary stent   Other Brother        colostomy    Alzheimer's disease Maternal Grandmother 50   Heart disease Maternal Grandfather    Heart disease Paternal Grandmother    Colon cancer Neg Hx    Colon polyps Neg Hx    Esophageal cancer Neg Hx    Rectal cancer Neg Hx    Stomach cancer Neg Hx    Pancreatic cancer Neg Hx     Social History   Tobacco Use   Smoking status: Never   Smokeless tobacco: Never  Vaping Use   Vaping status: Never Used  Substance Use Topics   Alcohol use: No    Alcohol/week: 0.0 standard drinks of alcohol   Drug use: No    ROS   Objective:   Vitals: BP (!) 160/84 (BP Location: Left Arm) Comment: checked twice, provider notified  Pulse 83   Temp 98.2 F (36.8 C) (Oral)   Resp 16   Ht 5\' 4"  (1.626 m)   Wt 145 lb (65.8 kg)   SpO2 98%   BMI 24.89 kg/m   Physical Exam Constitutional:      General: She is not in acute  distress.    Appearance: Normal appearance. She is well-developed. She is not ill-appearing, toxic-appearing or diaphoretic.  HENT:     Head: Normocephalic and atraumatic.     Nose: Nose normal.     Mouth/Throat:     Mouth: Mucous membranes are moist.     Pharynx: Oropharynx is clear.  Eyes:     General: No scleral icterus.       Right eye: No discharge.        Left eye: No discharge.     Extraocular Movements: Extraocular movements intact.     Conjunctiva/sclera: Conjunctivae normal.  Cardiovascular:     Rate and Rhythm: Normal rate.  Pulmonary:     Effort: Pulmonary effort is normal.  Abdominal:     General: Bowel sounds are normal. There is no distension.     Palpations: Abdomen is soft. There is no mass.     Tenderness: There is no abdominal tenderness. There is no right CVA tenderness, left CVA tenderness, guarding or rebound.  Skin:    General: Skin is warm and dry.  Neurological:     General: No focal deficit present.     Mental Status: She is alert and oriented to person, place, and time.  Psychiatric:        Mood and Affect: Mood normal.        Behavior: Behavior normal.        Thought Content: Thought content normal.        Judgment: Judgment normal.    Results for orders placed or performed during the hospital encounter of 10/03/23 (from the past 24 hours)  POCT urinalysis dipstick     Status: Abnormal   Collection Time: 10/03/23  2:26 PM  Result Value Ref Range   Color, UA light yellow (A) yellow   Clarity, UA clear clear   Glucose, UA negative negative mg/dL   Bilirubin, UA negative negative   Ketones, POC UA negative negative mg/dL   Spec Grav, UA <=1.610 (A) 1.010 - 1.025   Blood, UA negative negative   pH, UA 5.5 5.0 - 8.0   Protein Ur, POC negative negative mg/dL   Urobilinogen, UA 0.2 0.2 or 1.0 E.U./dL   Nitrite, UA Negative Negative   Leukocytes, UA Negative Negative    Assessment and  Plan :   PDMP not reviewed this encounter.  1. Recurrent  UTI   2. Dysuria   3. Acute vaginitis    Patient declined STI testing for now.  Recommended avoiding the diet sodas completely and hydrating with plain water.  Will cover for acute cystitis with ciprofloxacin given her medication allergies.  Use fluconazole for suspected concurrent yeast infection, antibiotic associated yeast infection.  Labs pending.  Counseled patient on potential for adverse effects with medications prescribed/recommended today, ER and return-to-clinic precautions discussed, patient verbalized understanding.    Wallis Bamberg, [place] 10/03/23 1432

## 2023-10-04 LAB — URINE CULTURE: Culture: NO GROWTH

## 2023-10-05 ENCOUNTER — Telehealth (HOSPITAL_COMMUNITY): Payer: Self-pay

## 2023-10-05 LAB — CERVICOVAGINAL ANCILLARY ONLY
Bacterial Vaginitis (gardnerella): POSITIVE — AB
Candida Glabrata: NEGATIVE
Candida Vaginitis: NEGATIVE
Comment: NEGATIVE
Comment: NEGATIVE
Comment: NEGATIVE

## 2023-10-05 MED ORDER — METRONIDAZOLE 500 MG PO TABS: 500.0000 mg | ORAL_TABLET | Freq: Two times a day (BID) | ORAL | 0 refills | Status: DC

## 2023-10-05 NOTE — Telephone Encounter (Signed)
 Per protocol, pt requires tx with metronidazole. Rx sent to pharmacy on file.

## 2023-10-06 ENCOUNTER — Telehealth: Payer: Self-pay

## 2023-10-06 ENCOUNTER — Ambulatory Visit (HOSPITAL_BASED_OUTPATIENT_CLINIC_OR_DEPARTMENT_OTHER)
Admission: RE | Admit: 2023-10-06 | Discharge: 2023-10-06 | Disposition: A | Discharge status: Home / Self Care | Source: Ambulatory Visit | Attending: Otolaryngology | Admitting: Otolaryngology

## 2023-10-06 DIAGNOSIS — H93A1 Pulsatile tinnitus, right ear: Secondary | ICD-10-CM | POA: Diagnosis not present

## 2023-10-06 MED ORDER — METRONIDAZOLE 0.75 % VA GEL: 1.0000 | Freq: Every day | VAGINAL | 0 refills | Status: AC

## 2023-10-06 MED ORDER — METRONIDAZOLE 1 % EX GEL: Freq: Every day | CUTANEOUS | 0 refills | Status: DC

## 2023-10-06 NOTE — Addendum Note (Signed)
 Addended by: Warren Danes on: 10/06/2023 10:17 AM   Modules accepted: Orders

## 2023-10-06 NOTE — Telephone Encounter (Signed)
 Pt requested gel rather than PO.

## 2023-10-07 ENCOUNTER — Telehealth (INDEPENDENT_AMBULATORY_CARE_PROVIDER_SITE_OTHER): Payer: Self-pay | Admitting: Otolaryngology

## 2023-10-07 NOTE — Telephone Encounter (Signed)
 Left vm to confirm appt date and location for 10/08/2023.

## 2023-10-08 ENCOUNTER — Encounter (INDEPENDENT_AMBULATORY_CARE_PROVIDER_SITE_OTHER): Payer: Self-pay

## 2023-10-08 ENCOUNTER — Encounter (INDEPENDENT_AMBULATORY_CARE_PROVIDER_SITE_OTHER): Payer: Self-pay | Admitting: Otolaryngology

## 2023-10-08 ENCOUNTER — Encounter: Payer: Self-pay | Admitting: Family Medicine

## 2023-10-08 ENCOUNTER — Ambulatory Visit: Admitting: Family Medicine

## 2023-10-08 ENCOUNTER — Ambulatory Visit (INDEPENDENT_AMBULATORY_CARE_PROVIDER_SITE_OTHER): Payer: Federal, State, Local not specified - PPO | Admitting: Otolaryngology

## 2023-10-08 VITALS — BP 151/84 | HR 87 | Ht 64.0 in | Wt 142.0 lb

## 2023-10-08 VITALS — BP 137/74 | HR 82 | Temp 97.3°F | Ht 64.0 in | Wt 142.0 lb

## 2023-10-08 DIAGNOSIS — H93A9 Pulsatile tinnitus, unspecified ear: Secondary | ICD-10-CM

## 2023-10-08 DIAGNOSIS — I6523 Occlusion and stenosis of bilateral carotid arteries: Secondary | ICD-10-CM | POA: Diagnosis not present

## 2023-10-08 DIAGNOSIS — E1159 Type 2 diabetes mellitus with other circulatory complications: Secondary | ICD-10-CM

## 2023-10-08 DIAGNOSIS — I6521 Occlusion and stenosis of right carotid artery: Secondary | ICD-10-CM | POA: Diagnosis not present

## 2023-10-08 DIAGNOSIS — I152 Hypertension secondary to endocrine disorders: Secondary | ICD-10-CM

## 2023-10-08 DIAGNOSIS — E119 Type 2 diabetes mellitus without complications: Secondary | ICD-10-CM

## 2023-10-08 DIAGNOSIS — E785 Hyperlipidemia, unspecified: Secondary | ICD-10-CM

## 2023-10-08 DIAGNOSIS — H93A1 Pulsatile tinnitus, right ear: Secondary | ICD-10-CM

## 2023-10-08 DIAGNOSIS — E1169 Type 2 diabetes mellitus with other specified complication: Secondary | ICD-10-CM | POA: Diagnosis not present

## 2023-10-08 DIAGNOSIS — Z0110 Encounter for examination of ears and hearing without abnormal findings: Secondary | ICD-10-CM

## 2023-10-08 DIAGNOSIS — H5711 Ocular pain, right eye: Secondary | ICD-10-CM | POA: Insufficient documentation

## 2023-10-08 DIAGNOSIS — R6889 Other general symptoms and signs: Secondary | ICD-10-CM | POA: Insufficient documentation

## 2023-10-08 DIAGNOSIS — F419 Anxiety disorder, unspecified: Secondary | ICD-10-CM

## 2023-10-08 DIAGNOSIS — F4310 Post-traumatic stress disorder, unspecified: Secondary | ICD-10-CM | POA: Diagnosis not present

## 2023-10-08 LAB — POCT GLYCOSYLATED HEMOGLOBIN (HGB A1C): Hemoglobin A1C: 6.6 % — AB (ref 4.0–5.6)

## 2023-10-08 NOTE — Assessment & Plan Note (Signed)
 She is working with ENT for this.  Also did see vascular surgery however they did not think that her carotid stenosis was the cause of this.  She did have a recent temporal bone CT scan which does not appear to show any other obvious explanation for her pulsatile tinnitus.  She will follow-up with ENT soon.

## 2023-10-08 NOTE — Telephone Encounter (Signed)
 Form printed and placed to be reviewed in PCP office

## 2023-10-08 NOTE — Assessment & Plan Note (Signed)
 We reviewed her recent ABIs that was ordered by cardiology.  Did show abnormal TBI bilaterally.  Advised her to call to schedule appoint with vascular surgery soon for further management.

## 2023-10-08 NOTE — Assessment & Plan Note (Signed)
 A1c today is 6.6.  She would like to avoid medications.  She is working on diet and exercise.  Will recheck A1c in 3 months at her CPE.

## 2023-10-08 NOTE — Assessment & Plan Note (Signed)
 Had lengthy discussion with patient today regarding management for her dyslipidemia.  She is very hesitant to start statin due to her previous history of idiopathic hepatitis.  She did not start the Crestor that was prescribed by cardiology.  She is working on lifestyle interventions.  We did discuss importance of managing her cholesterol.  She may be interested in nonstatin medications.  Advised her to follow-up with cardiology soon to discuss other alternatives.  We also did discuss cardiac imaging to rule out any blockages in her heart.  She can discuss this with cardiology as well at her next visit with them.

## 2023-10-08 NOTE — Progress Notes (Signed)
 Selena Holt is a 53 y.o. female who presents today for an office visit.  Assessment/Plan:  Chronic Problems Addressed Today: Pulsatile tinnitus She is working with ENT for this.  Also did see vascular surgery however they did not think that her carotid stenosis was the cause of this.  She did have a recent temporal bone CT scan which does not appear to show any other obvious explanation for her pulsatile tinnitus.  She will follow-up with ENT soon.  Eye pain, right No red flags on exam today.  She is not currently having any symptoms.  Concern for possible glaucoma due to her reported history " elevated eye pressure" at her optometrist office.  Given that she is not currently having symptoms and has a normal exam today do not think that we need to do anything urgently however we will place a referral for her to see ophthalmology for further evaluation and management.   T2DM (type 2 diabetes mellitus) (HCC) A1c today is 6.6.  She would like to avoid medications.  She is working on diet and exercise.  Will recheck A1c in 3 months at her CPE.  Hypertension associated with diabetes (HCC) Blood pressure at goal today without medications.  She is monitoring at home and blood pressures are typically at goal.  She would like to avoid medications if possible.  She will continue to monitor at home and let us know if persistently elevated.  Dyslipidemia associated with type 2 diabetes mellitus (HCC) Had lengthy discussion with patient today regarding management for her dyslipidemia.  She is very hesitant to start statin due to her previous history of idiopathic hepatitis.  She did not start the Crestor that was prescribed by cardiology.  She is working on lifestyle interventions.  We did discuss importance of managing her cholesterol.  She may be interested in nonstatin medications.  Advised her to follow-up with cardiology soon to discuss other alternatives.  We also did discuss cardiac imaging to  rule out any blockages in her heart.  She can discuss this with cardiology as well at her next visit with them.  Carotid artery stenosis Following with vascular surgery for this.  She is on baby aspirin daily and we are attempting to manage her lipids as above.  Anxiety Overall symptoms are stable although she does need [place] to complete FMLA paperwork today.  Abnormal ankle brachial index (ABI) We reviewed her recent ABIs that was ordered by cardiology.  Did show abnormal TBI bilaterally.  Advised her to call to schedule appoint with vascular surgery soon for further management.      Subjective:  HPI:  See A/P for status of chronic conditions.  Patient is here today for follow-up.  I last saw her about 4 months ago.  At that time she was having ongoing issues with pulsatile tinnitus.  We had referred her to vascular surgery who did not think that her carotid stenosis is likely the cause of this.  We did obtain an MRI of her head and neck which showed irregularity of her right carotid artery but no other significant abnormalities.  We subsequently referred her to ENT.  ENT did think that her symptoms were due to her carotid artery stenosis.  They checked a CT temporal bone recently to look for other possible causes.  She has not yet heard back from them regarding this though still does have ongoing issues with pulsatile tinnitus in her right ear.  Since our last visit she also established with  cardiology couple of months ago.  At that time her blood pressure was 160/90 and they started her on losartan.  We also started her on Crestor 10 mg daily to lower LDL with goal of 55.  She did not start either these.  Her blood pressure readings at home have been at goal.  She did not take the statin as she is very hesitant to do this due to her past issues with idiopathic hepatitis.  The cardiologist did order ABIs about a week ago.  She was told that her results were normal however she was concerned about the  toe brachial index readings for both of her feet.  She still gets pain and swelling in her feet with exercise.  Also feels like her toes are persistently cold.  Since her last visit she has been having more pain and blurred vision in her right eye.  This is been going on for quite a while but does seem to be getting worse.  She has an optometrist and has discussed this with him as well.  She has been told that she has elevated pressure in her right eye however she has not been diagnosed with glaucoma.  Symptoms are intermittent nature.  No obvious precipitating events.        Objective:  Physical Exam: BP 137/74   Pulse 82   Temp (!) 97.3 F (36.3 C) (Temporal)   Ht 5\' 4"  (1.626 m)   Wt 142 lb (64.4 kg)   SpO2 100%   BMI 24.37 kg/m   Gen: No acute distress, resting comfortably HEENT: Extraocular eye movements intact without pain.  Visual acuity grossly intact.  No obvious abnormalities on limited funduscopic exam. CV: Regular rate and rhythm with no murmurs appreciated Pulm: Normal work of breathing, clear to auscultation bilaterally with no crackles, wheezes, or rhonchi Neuro: Cranial nerves II through XII intact.  Moves all extremities.  Sensation light touch intact throughout. Psych: Normal affect and thought content  Time Spent: 45 minutes of total time was spent on the date of the encounter performing the following actions: chart review prior to seeing the patient, obtaining history, performing a medically necessary exam, counseling on the treatment plan, placing orders, and documenting in our EHR.        Katina Degree. [name], MD 10/08/2023 8:53 AM

## 2023-10-08 NOTE — Assessment & Plan Note (Signed)
 No red flags on exam today.  She is not currently having any symptoms.  Concern for possible glaucoma due to her reported history " elevated eye pressure" at her optometrist office.  Given that she is not currently having symptoms and has a normal exam today do not think that we need to do anything urgently however we will place a referral for her to see ophthalmology for further evaluation and management.

## 2023-10-08 NOTE — Assessment & Plan Note (Signed)
 Blood pressure at goal today without medications.  She is monitoring at home and blood pressures are typically at goal.  She would like to avoid medications if possible.  She will continue to monitor at home and let us know if persistently elevated.

## 2023-10-08 NOTE — Patient Instructions (Signed)
 It was very nice to see you today!  Your blood pressure is at goal today.  It is okay for you to stay off of your blood pressure medication.  Please monitor this at home and let us know if it is persistently elevated.  Please call your vascular surgeon soon to schedule appointment to discuss your recent ultrasound.  You are not getting enough blood flow into your toes.  You will probably need further testing for this.  I will refer you to see an ophthalmologist for the pain and blurred vision in your eye.  We will work on your [employer] (FMLA).  I will refer you to see cardiology and [facility] for further testing of your heart.  You can also call to schedule appointment.  Return for Annual Physical.   Take care, Dr [name]  PLEASE NOTE:  If you had any lab tests, please let us know if you have not heard back within a few days. You may see your results on mychart before we have a chance to review them but we will give you a call once they are reviewed by [place].   If we ordered any referrals today, please let us know if you have not heard from their office within the next week.   If you had any urgent prescriptions sent in today, please check with the pharmacy within an hour of our visit to make sure the prescription was transmitted appropriately.   Please try these tips to maintain a healthy lifestyle:  Eat at least 3 REAL meals and 1-2 snacks per day.  Aim for no more than 5 hours between eating.  If you eat breakfast, please do so within one hour of getting up.   Each meal should contain half fruits/vegetables, one quarter protein, and one quarter carbs (no bigger than a computer mouse)  Cut down on sweet beverages. This includes juice, soda, and sweet tea.   Drink at least 1 glass of water with each meal and aim for at least 8 glasses per day  Exercise at least 150 minutes every week.

## 2023-10-08 NOTE — Progress Notes (Signed)
 Dear Dr. [name], Here is my assessment for our mutual patient, Ashantee Deupree. Thank you for allowing me the opportunity to care for your patient. Please do not hesitate to contact me should you have any other questions. Sincerely, Dr. Jovita Kussmaul  Otolaryngology Clinic Note Referring provider: Dr. [name] HPI:  Selena Holt is a 53 y.o. female kindly referred by Dr. [name] for evaluation of right pulsatile tinnitus.  Initial visit (07/2023): Patient reports: initially started bilaterally but now having only right ear pulsatile tinnitus ("swooshing sound" and concordant with her heart beat). Sound seems a bit muffled now. She reports that this started around July 2024. No antecedent event - no loud noise, trauma, infections. She initially thought maybe she had wax build up due to some discomfort in the ear (deep) but discomfort resolved shortly thereafter it seems and maybe some imbalance (but no vertigo). No autophony, no sound/pressure induced vertigo. She feels like hearing has declined in the right ear since she had an episode of spinal meningitis in 2005. She does report some popping/crackling randomly. No other recent med changes, on ASA 81  She has seen vascular surgery for this as she does have some irregularity/stenosis at right carotid bifurcation. Reports that vascular does not think this is the cause of her tinnitus. She has also had a CTA and MRA (see below). No headaches, no vision changes.   Patient denies: ear pain, fullness, vertigo, drainage Patient also denies barotrauma, vestibular suppressant use, ototoxic medication use Prior ear surgery: Right tympanostomy tube (2005) during spinal meningitis --- thought to be due to Otitis media (Dr. Ezzard Standing performed the procedure).   --------------------------------------------------------- 10/08/2023 Continues to have right ear pulsatile tinnitus. No hearing change, we did discuss her CT results. No vertigo, drainage, pain, fullness.     H&N Surgery: none besides right Tymp tube Personal or FHx of bleeding dz or anesthesia difficulty: no  GLP-1: no AP/AC: ASA 81  Tobacco: no. Lives in [place], [place] PMHx: HDL, Carotid Stenosis, HTN, DM  Independent Review of Additional Tests or Records:  Dr. [name] (FM) 12/192024: Noted pulsatile tinnitus, stable sx; has seen vascular surgery for carotid stenosis; prior MRI no abnormalities; Dx: Pulsatile tinnitus; Rx: Ref ENT Dr. [name] (FM) 05/22/2023: Noted "swishing sensation in ear" starting this fall; no antecedent events; sounds like heartbeat. No vertigo, other ear sx; b/l IC stenosis 50-69%; Dx: Pulsatile tinnitus; Rx: ref Vascular Dr. Doran Heater 03/13/2017: Right ETD, no fullness, no vertigo; rare tinnitus, non-pulsatile; Did do audio - A/A b/l, essentially normal hearing  07/2023 Audiogram was independently reviewed and interpreted by me and it reveals - generally normal hearing thresholds; minimal (<10dB ABG AD); SRT 25 dB AD, 15 dB AS; WRT 100% at 60dB and55dB AD and AS respectively; A/A tymps    SNHL= Sensorineural hearing loss  MR Angio Head, Neck, and CTA Head/Neck w/wo reviewed independently with attention to ears: agree no vertebral artery stenosis, noted irregularity at right carotid bifurcation and proximal right ICA, CTA with some stenosis over right ICA; no mastoid or ME effusion; cuts thick but no significant ossicular chain or otic capsule abnormality or middle era mass  CT Temporal bones 10/06/2023 reviewed independently and interpreted independently with respect to ears: mastoids and ME well aerated; no otic capsule or ossicular chain pathology noted; no high riding carotid or jugular bulb; SCC appear covered; does have partially empty sella   PMH/Meds/All/SocHx/FamHx/ROS:   Past Medical History:  Diagnosis Date   Allergy    Anemia    hx of  Anxiety    hx of   Bipolar disorder (HCC)    Carotid artery occlusion    Collapsed lung    "spontaneous" ,  teenager   Constipation    Depression    hx of   Diabetes mellitus without complication (HCC)    on meds   Dyslipidemia associated with type 2 diabetes mellitus (HCC) 02/10/2014   Glaucoma    Heart murmur    HSV-1 (herpes simplex virus 1) infection    Hypertension    on meds   Kidney stones    Meningitis    Scoliosis (and kyphoscoliosis), idiopathic      Past Surgical History:  Procedure Laterality Date   CHEST TUBE INSERTION     TYMPANOSTOMY TUBE PLACEMENT      Family History  Problem Relation Age of Onset   Hyperlipidemia Mother    Hypertension Mother    Hyperlipidemia Father    Hypertension Father    CAD Father    Heart disease Father 6       coronary stent   Other Brother        colostomy    Alzheimer's disease Maternal Grandmother 50   Heart disease Maternal Grandfather    Heart disease Paternal Grandmother    Colon cancer Neg Hx    Colon polyps Neg Hx    Esophageal cancer Neg Hx    Rectal cancer Neg Hx    Stomach cancer Neg Hx    Pancreatic cancer Neg Hx      Social Connections: Unknown (10/07/2023)   Social Connection and Isolation Panel [NHANES]    Frequency of Communication with Friends and Family: Patient declined    Frequency of Social Gatherings with Friends and Family: Patient declined    Attends Religious Services: Patient declined    Database administrator or Organizations: Patient declined    Attends Engineer, structural: Not on file    Marital Status: Patient declined      Current Outpatient Medications:    Ascorbic Acid (VITAMIN C) 100 MG CHEW, , Disp: , Rfl:    aspirin 81 MG chewable tablet, Chew 81 mg by mouth daily., Disp: , Rfl:    blood glucose meter kit and supplies KIT, Dispense based on patient and insurance preference. Use up to four times daily as directed. (FOR ICD-9 250.00, 250.01)., Disp: 1 each, Rfl: 0   Cyanocobalamin (VITAMIN B-12 PO), Take 1,200 mcg by mouth. Pt only taking twice a week, Disp: , Rfl:     fluticasone (FLONASE) 50 MCG/ACT nasal spray, Place 2 sprays into both nostrils daily., Disp: 16 g, Rfl: 2   glucose blood (ONETOUCH VERIO) test strip, Use as instructed to check blood sugar up to 3 times daily, Disp: 300 each, Rfl: 0   Magnesium 250 MG TABS, Take 1 tablet by mouth daily at 2 PM., Disp: , Rfl:    METAMUCIL FIBER PO, Take by mouth., Disp: , Rfl:    metroNIDAZOLE (METROGEL) 0.75 % vaginal gel, Place 1 Applicatorful vaginally at bedtime for 5 days., Disp: 50 g, Rfl: 0   OneTouch Delica Lancets 33G MISC, Use daily as needed to check blood sugar., Disp: 100 each, Rfl: 2   Cholecalciferol 1.25 MG (50000 UT) TABS, Take 1 tablet by mouth daily. (Patient not taking: Reported on 10/08/2023), Disp: , Rfl:    fluconazole (DIFLUCAN) 150 MG tablet, Take 1 tablet (150 mg total) by mouth once a week. (Patient not taking: Reported on 10/08/2023), Disp: 2 tablet, Rfl:  0   Physical Exam:   BP (!) 151/84 (BP Location: Left Arm, Patient Position: Sitting, Cuff Size: Large)   Pulse 87   Ht 5\' 4"  (1.626 m)   Wt 142 lb (64.4 kg)   SpO2 94%   BMI 24.37 kg/m   Salient findings:  CN II-XII intact Bilateral EAC clear and TM intact with well pneumatized middle ear spaces; no pulsatile ME lesions noted Weber 512: mid Rinne 512: AC > BC b/l  Anterior rhinoscopy: Septum intact; bilateral inferior turbinates without significant hypertrophy No lesions of oral cavity/oropharynx; dentition fair No obviously palpable neck masses/lymphadenopathy/thyromegaly No respiratory distress or stridor Clearly audible right carotid bruit.  No change in tinnitus on neck compression or neck turning  Seprately Identifiable Procedures:  None   Impression & Plans:  Starlina Lapre is a 53 y.o. female with h/o spinal meningitis s/p right tymp tube (2005) now with:  1. Pulsatile tinnitus of right ear   2. Normal hearing test of both ears   3. Stenosis of right carotid artery    Right pulsatile tinnitus with noted  bruit over right carotid and some right carotid stenosis as well. We discussed the extensive possibilities/wide DDX for right pulsatile tinnitus. It appears that given audible bruit, would expect this to be the cause of her tinnitus given no other findings. She has had extensive additional workup including CTA, MRA, CT Temporal bones as well without obvious etiology or mass; does have partially empty sella so IIH a possibility. Do not think this is paraganglioma or venous cause as this is not a hum but rather rapid pulsatile tinnitus. Do not think myoclonus. She does not have other symptoms.   We discussed options: refer to neuro or angio for IIH w/u or to assess for AVM; she wishes to hold off on further workup; still think stenosis could also contribute - f/u PRN should other sx develop or if she wishes to pursue further w/u  See below regarding exact medications prescribed this encounter including dosages and route: No orders of the defined types were placed in this encounter.     Thank you for allowing me the opportunity to care for your patient. Please do not hesitate to contact me should you have any other questions.  Sincerely, Jovita Kussmaul, MD Otolaryngologist (ENT), [facility] Phone: [phone] Fax: [phone]  10/08/2023, 12:49 PM   I have personally spent 32 minutes involved in face-to-face and non-face-to-face activities for this patient on the day of the visit.  Professional time spent excludes any procedures performed but includes the following activities, in addition to those noted in the documentation: preparing to see the patient (review of outside documentation and results), performing a medically appropriate examination, counseling, documenting in the electronic health record, independently interpreting results (CT).

## 2023-10-08 NOTE — Assessment & Plan Note (Signed)
 Following with vascular surgery for this.  She is on baby aspirin daily and we are attempting to manage her lipids as above.

## 2023-10-08 NOTE — Assessment & Plan Note (Signed)
 Overall symptoms are stable although she does need [place] to complete FMLA paperwork today.

## 2023-10-13 NOTE — Telephone Encounter (Signed)
 Patient aware for ready to be pick up, placed at front office  Copy placed to be scan in patient chart

## 2023-10-30 DIAGNOSIS — N898 Other specified noninflammatory disorders of vagina: Secondary | ICD-10-CM | POA: Diagnosis not present

## 2023-10-30 DIAGNOSIS — N771 Vaginitis, vulvitis and vulvovaginitis in diseases classified elsewhere: Secondary | ICD-10-CM | POA: Diagnosis not present

## 2023-10-30 DIAGNOSIS — Z113 Encounter for screening for infections with a predominantly sexual mode of transmission: Secondary | ICD-10-CM | POA: Diagnosis not present

## 2023-10-30 DIAGNOSIS — N760 Acute vaginitis: Secondary | ICD-10-CM | POA: Diagnosis not present

## 2023-10-30 DIAGNOSIS — R350 Frequency of micturition: Secondary | ICD-10-CM | POA: Diagnosis not present

## 2023-11-02 ENCOUNTER — Encounter: Payer: Self-pay | Admitting: Family Medicine

## 2023-11-02 NOTE — Telephone Encounter (Signed)
**Note De-identified  [place] Obfuscation** Please advise 

## 2023-11-03 NOTE — Telephone Encounter (Signed)
 She needs to be evaluated ASAP. She should go to the Emergency Department if she has another occurrence of flashing lights as this could be due to a detached retina.  Selena Holt. Daneil Dunker, MD 11/03/2023 6:58 PM

## 2023-11-04 NOTE — Telephone Encounter (Signed)
 needs to be evaluated ASAP. You should go to the Emergency Department if she has another occurrence of flashing lights as this could be due to a detached retina.   Selena Holt. Daneil Dunker, MD/Kyaira Trantham,RMA

## 2023-11-06 DIAGNOSIS — H25813 Combined forms of age-related cataract, bilateral: Secondary | ICD-10-CM | POA: Diagnosis not present

## 2023-11-06 DIAGNOSIS — H04123 Dry eye syndrome of bilateral lacrimal glands: Secondary | ICD-10-CM | POA: Diagnosis not present

## 2023-11-06 DIAGNOSIS — G43B0 Ophthalmoplegic migraine, not intractable: Secondary | ICD-10-CM | POA: Diagnosis not present

## 2023-11-06 DIAGNOSIS — E119 Type 2 diabetes mellitus without complications: Secondary | ICD-10-CM | POA: Diagnosis not present

## 2023-11-11 ENCOUNTER — Telehealth: Payer: Self-pay | Admitting: Cardiology

## 2023-11-11 NOTE — Telephone Encounter (Signed)
 New Message:      Patient would like to switch from Dr [place] to Dr Veryl Gottron. Is this alright with you?

## 2023-12-04 ENCOUNTER — Encounter (HOSPITAL_BASED_OUTPATIENT_CLINIC_OR_DEPARTMENT_OTHER): Admitting: Cardiology

## 2023-12-04 ENCOUNTER — Encounter (HOSPITAL_BASED_OUTPATIENT_CLINIC_OR_DEPARTMENT_OTHER): Payer: Self-pay

## 2023-12-06 NOTE — Progress Notes (Signed)
 Visit rescheduled per patient preference as clinic was running behind; no charge for encounter (no International aid/development worker for coding purposes)

## 2023-12-11 ENCOUNTER — Ambulatory Visit: Payer: Self-pay | Admitting: *Deleted

## 2023-12-11 DIAGNOSIS — F4310 Post-traumatic stress disorder, unspecified: Secondary | ICD-10-CM | POA: Diagnosis not present

## 2023-12-11 NOTE — Telephone Encounter (Signed)
 noted

## 2023-12-11 NOTE — Telephone Encounter (Signed)
  Chief Complaint: sore throat severe pain with swallowing but can swallow requesting strep test Symptoms: sore throat redness noted with white patches back of throat . Can swallow but painful Frequency: Monday  Pertinent Negatives: Patient denies fever  Disposition: [] ED /[x] Urgent Care (no appt availability in office) / [] Appointment(In office/virtual)/ []  [facility]/ [] Home Care/ [] Refused Recommended Disposition /[] [place] [facility]/ []  Follow-up with PCP Additional Notes:   No available appt with PCP or other providers, recommended UC for strep testing.  Patient reports calling earlier and was told she could have a nurse swab her throat after 3 pm.  Reviewed with patient she must have appt for evaluation and if PCP orders testing a nurse could swab throat but must have appt.     Reason for Disposition  SEVERE (e.g., excruciating) throat pain  Answer Assessment - Initial Assessment Questions 1. ONSET: "When did the throat start hurting?" (Hours or days ago)      Monday  2. SEVERITY: "How bad is the sore throat?" (Scale 1-10; mild, moderate or severe)   - MILD (1-3):  Doesn't interfere with eating or normal activities.   - MODERATE (4-7): Interferes with eating some solids and normal activities.   - SEVERE (8-10):  Excruciating pain, interferes with most normal activities.   - SEVERE WITH DYSPHAGIA (10): Can't swallow liquids, drooling.     Can swallow but severe pain with swallowing  3. STREP EXPOSURE: "Has there been any exposure to strep within the past week?" If Yes, ask: "What type of contact occurred?"      Unsure  4.  VIRAL SYMPTOMS: "Are there any symptoms of a cold, such as a runny nose, cough, hoarse voice or red eyes?"      Sore throat redness, white patches noted on throat 5. FEVER: "Do you have a fever?" If Yes, ask: "What is your temperature, how was it measured, and when did it start?"     na 6. PUS ON THE TONSILS: "Is there pus on the tonsils in the  back of your throat?"     White patches  7. OTHER SYMPTOMS: "Do you have any other symptoms?" (e.g., difficulty breathing, headache, rash)     Sore  throat  8. PREGNANCY: "Is there any chance you are pregnant?" "When was your last menstrual period?"     na  Protocols used: Sore Throat-A-AH

## 2024-01-07 DIAGNOSIS — F4310 Post-traumatic stress disorder, unspecified: Secondary | ICD-10-CM | POA: Diagnosis not present

## 2024-01-11 ENCOUNTER — Other Ambulatory Visit: Payer: Self-pay

## 2024-01-11 ENCOUNTER — Encounter: Payer: Self-pay | Admitting: Family Medicine

## 2024-01-11 ENCOUNTER — Ambulatory Visit (INDEPENDENT_AMBULATORY_CARE_PROVIDER_SITE_OTHER): Admitting: Family Medicine

## 2024-01-11 ENCOUNTER — Other Ambulatory Visit (HOSPITAL_BASED_OUTPATIENT_CLINIC_OR_DEPARTMENT_OTHER): Payer: Self-pay

## 2024-01-11 VITALS — BP 130/80 | HR 87 | Temp 97.5°F | Ht 64.0 in | Wt 140.6 lb

## 2024-01-11 DIAGNOSIS — Z0001 Encounter for general adult medical examination with abnormal findings: Secondary | ICD-10-CM

## 2024-01-11 DIAGNOSIS — E1159 Type 2 diabetes mellitus with other circulatory complications: Secondary | ICD-10-CM

## 2024-01-11 DIAGNOSIS — D509 Iron deficiency anemia, unspecified: Secondary | ICD-10-CM

## 2024-01-11 DIAGNOSIS — N951 Menopausal and female climacteric states: Secondary | ICD-10-CM | POA: Diagnosis not present

## 2024-01-11 DIAGNOSIS — Z79890 Hormone replacement therapy (postmenopausal): Secondary | ICD-10-CM | POA: Diagnosis not present

## 2024-01-11 DIAGNOSIS — E785 Hyperlipidemia, unspecified: Secondary | ICD-10-CM

## 2024-01-11 DIAGNOSIS — E538 Deficiency of other specified B group vitamins: Secondary | ICD-10-CM

## 2024-01-11 DIAGNOSIS — Z0000 Encounter for general adult medical examination without abnormal findings: Secondary | ICD-10-CM

## 2024-01-11 DIAGNOSIS — E119 Type 2 diabetes mellitus without complications: Secondary | ICD-10-CM

## 2024-01-11 DIAGNOSIS — E559 Vitamin D deficiency, unspecified: Secondary | ICD-10-CM | POA: Diagnosis not present

## 2024-01-11 DIAGNOSIS — I152 Hypertension secondary to endocrine disorders: Secondary | ICD-10-CM

## 2024-01-11 DIAGNOSIS — E1169 Type 2 diabetes mellitus with other specified complication: Secondary | ICD-10-CM

## 2024-01-11 DIAGNOSIS — N393 Stress incontinence (female) (male): Secondary | ICD-10-CM | POA: Diagnosis not present

## 2024-01-11 LAB — CBC
HCT: 37 % (ref 36.0–46.0)
Hemoglobin: 12.5 g/dL (ref 12.0–15.0)
MCHC: 33.8 g/dL (ref 30.0–36.0)
MCV: 91.1 fl (ref 78.0–100.0)
Platelets: 275 10*3/uL (ref 150.0–400.0)
RBC: 4.06 Mil/uL (ref 3.87–5.11)
RDW: 13.3 % (ref 11.5–15.5)
WBC: 9.8 10*3/uL (ref 4.0–10.5)

## 2024-01-11 LAB — MICROALBUMIN / CREATININE URINE RATIO
Creatinine,U: 25 mg/dL
Microalb Creat Ratio: 204.4 mg/g — ABNORMAL HIGH (ref 0.0–30.0)
Microalb, Ur: 5.1 mg/dL — ABNORMAL HIGH (ref 0.0–1.9)

## 2024-01-11 LAB — COMPREHENSIVE METABOLIC PANEL WITH GFR
ALT: 61 U/L — ABNORMAL HIGH (ref 0–35)
AST: 34 U/L (ref 0–37)
Albumin: 4.7 g/dL (ref 3.5–5.2)
Alkaline Phosphatase: 136 U/L — ABNORMAL HIGH (ref 39–117)
BUN: 11 mg/dL (ref 6–23)
CO2: 26 meq/L (ref 19–32)
Calcium: 9.4 mg/dL (ref 8.4–10.5)
Chloride: 102 meq/L (ref 96–112)
Creatinine, Ser: 0.73 mg/dL (ref 0.40–1.20)
GFR: 95.16 mL/min (ref >60.00)
Glucose, Bld: 149 mg/dL — ABNORMAL HIGH (ref 70–99)
Potassium: 3.9 meq/L (ref 3.5–5.1)
Sodium: 137 meq/L (ref 135–145)
Total Bilirubin: 0.5 mg/dL (ref 0.2–1.2)
Total Protein: 7 g/dL (ref 6.0–8.3)

## 2024-01-11 LAB — LIPID PANEL
Cholesterol: 183 mg/dL (ref 0–200)
HDL: 49.1 mg/dL (ref >39.00)
LDL Cholesterol: 109 mg/dL — ABNORMAL HIGH (ref 0–99)
NonHDL: 134.03
Total CHOL/HDL Ratio: 4
Triglycerides: 125 mg/dL (ref 0.0–149.0)
VLDL: 25 mg/dL (ref 0.0–40.0)

## 2024-01-11 LAB — IBC + FERRITIN
Ferritin: 73.7 ng/mL (ref 10.0–291.0)
Iron: 81 ug/dL (ref 42–145)
Saturation Ratios: 21.4 % (ref 20.0–50.0)
TIBC: 378 ug/dL (ref 250.0–450.0)
Transferrin: 270 mg/dL (ref 212.0–360.0)

## 2024-01-11 LAB — MAGNESIUM: Magnesium: 1.9 mg/dL (ref 1.5–2.5)

## 2024-01-11 LAB — C-REACTIVE PROTEIN: CRP: 1.2 mg/dL (ref 0.5–20.0)

## 2024-01-11 LAB — SEDIMENTATION RATE: Sed Rate: 7 mm/h (ref 0–30)

## 2024-01-11 LAB — VITAMIN B12: Vitamin B-12: 1269 pg/mL — ABNORMAL HIGH (ref 211–911)

## 2024-01-11 LAB — T4, FREE: Free T4: 0.65 ng/dL (ref 0.60–1.60)

## 2024-01-11 LAB — HEMOGLOBIN A1C: Hgb A1c MFr Bld: 6.9 % — ABNORMAL HIGH (ref 4.6–6.5)

## 2024-01-11 LAB — VITAMIN D 25 HYDROXY (VIT D DEFICIENCY, FRACTURES): VITD: 53.4 ng/mL (ref 30.00–100.00)

## 2024-01-11 LAB — FOLATE: Folate: 4.4 ng/mL — ABNORMAL LOW (ref >5.9)

## 2024-01-11 LAB — T3, FREE: T3, Free: 3.8 pg/mL (ref 2.3–4.2)

## 2024-01-11 LAB — TSH: TSH: 2.54 u[IU]/mL (ref 0.35–5.50)

## 2024-01-11 MED ORDER — ESTRADIOL 0.025 MG/24HR TD PTTW
1.0000 | MEDICATED_PATCH | TRANSDERMAL | 3 refills | Status: DC
  Filled 2024-01-11: qty 8, 28d supply, fill #0
  Filled 2024-02-04: qty 8, 28d supply, fill #1
  Filled 2024-03-01: qty 8, 28d supply, fill #2
  Filled 2024-04-01 – 2024-04-02 (×2): qty 8, 28d supply, fill #3

## 2024-01-11 MED ORDER — PROGESTERONE MICRONIZED 100 MG PO CAPS
100.0000 mg | ORAL_CAPSULE | Freq: Every day | ORAL | 3 refills | Status: AC
  Filled 2024-01-11: qty 90, 90d supply, fill #0

## 2024-01-11 NOTE — Progress Notes (Signed)
 Chief Complaint:  Selena Holt is a 53 y.o. female who presents today for her annual comprehensive physical exam.    Assessment/Plan:  Chronic Problems Addressed Today: Menopausal symptoms She has been following with gynecology for this.  They are discussing putting her on hormone replacement though she is currently doing well with vaginal Estrace .  Will check labs today per patient request including CBC, c-Met, thyroid  labs, B12, vitamin D , magnesium, zinc, CRP, and sed rate.  T2DM (type 2 diabetes mellitus) (HCC) Not currently on any medications.  She is working on diet and exercise.  Recheck A1c today.  Dyslipidemia associated with type 2 diabetes mellitus (HCC) She had to reschedule her most recent cardiology appointment and will be seeing them again in a couple of months.  She has been working on last interventions and would like to avoid statin if possible.  Check labs today.  Vitamin D  deficiency Check vitamin D .  Vitamin B 12 deficiency Check B12.  Anemia, iron deficiency Check CBC and iron panel.  Hypertension associated with diabetes (HCC) Initially elevated however at goal on recheck without medications.  Preventative Healthcare: Check labs.   Patient Counseling(The following topics were reviewed and/or handout was given):  -Nutrition: Stressed importance of moderation in sodium/caffeine  intake, saturated fat and cholesterol, caloric balance, sufficient intake of fresh fruits, vegetables, and fiber.  -Stressed the importance of regular exercise.   -Substance Abuse: Discussed cessation/primary prevention of tobacco, alcohol, or other drug use; driving or other dangerous activities under the influence; availability of treatment for abuse.   -Injury prevention: Discussed safety belts, safety helmets, smoke detector, smoking near bedding or upholstery.   -Sexuality: Discussed sexually transmitted diseases, partner selection, use of condoms, avoidance of unintended  pregnancy and contraceptive alternatives.   -Dental health: Discussed importance of regular tooth brushing, flossing, and dental visits.  -Health maintenance and immunizations reviewed. Please refer to Health maintenance section.  Return to care in 1 year for next preventative visit.     Subjective:  HPI:  She has no acute complaints today. She is here today for her annual physical. She is doing well today. Since our last visit she has followed with GYN for her menopausal symptoms. She recently started vaginal estrace  and is doing well with this. She is considering starting hormone replacement therapy. She would like to have blood work done today.   Lifestyle Diet: None specific.  Exercise: Doing a lot of walking and zoomba classes.      01/11/2024    8:39 AM  Depression screen PHQ 2/9  Decreased Interest 0  Down, Depressed, Hopeless 0  PHQ - 2 Score 0    Health Maintenance Due  Topic Date Due   Fecal DNA (Cologuard)  Never done   Cervical Cancer Screening (HPV/Pap Cotest)  11/09/2017   FOOT EXAM  08/26/2018   Pneumococcal Vaccine 28-70 Years old (3 of 3 - PCV20 or PCV21) 07/10/2022   Zoster Vaccines- Shingrix (1 of 2) Never done   Diabetic kidney evaluation - Urine ACR  09/02/2023   OPHTHALMOLOGY EXAM  11/04/2023     ROS: Per HPI, otherwise a complete review of systems was negative.   PMH:  The following were reviewed and entered/updated in epic: Past Medical History:  Diagnosis Date   Allergy    Anemia    hx of   Anxiety    hx of   Bipolar disorder (HCC)    Carotid artery occlusion    Collapsed lung    spontaneous ,  teenager   Constipation    Depression    hx of   Diabetes mellitus without complication (HCC)    on meds   Dyslipidemia associated with type 2 diabetes mellitus (HCC) 02/10/2014   Glaucoma    Heart murmur    HSV-1 (herpes simplex virus 1) infection    Hypertension    on meds   Kidney stones    Meningitis    Scoliosis (and  kyphoscoliosis), idiopathic    Patient Active Problem List   Diagnosis Date Noted   Eye pain, right 10/08/2023   Abnormal ankle brachial index (ABI) 10/08/2023   Pulsatile tinnitus 07/09/2023   Tachycardia 05/22/2023   Carotid artery stenosis 05/22/2023   Menopausal symptoms 11/06/2022   Rash and other nonspecific skin eruption 09/08/2022   Other allergic rhinitis 09/08/2022   Local reaction to hymenoptera sting 09/08/2022   History of asthma 09/08/2022   Food intolerance 09/08/2022   Hypertension associated with diabetes (HCC) 03/01/2020   Back pain 05/13/2019   Anxiety 01/14/2018   Hepatocellular dysfunction 01/08/2018   Anemia, iron deficiency 01/03/2015   Vitamin B 12 deficiency 03/09/2014   Dyslipidemia associated with type 2 diabetes mellitus (HCC) 02/10/2014   Vitamin D  deficiency 02/10/2014   T2DM (type 2 diabetes mellitus) (HCC) 11/11/2012   Past Surgical History:  Procedure Laterality Date   CHEST TUBE INSERTION     TYMPANOSTOMY TUBE PLACEMENT      Family History  Problem Relation Age of Onset   Hyperlipidemia Mother    Hypertension Mother    Hyperlipidemia Father    Hypertension Father    CAD Father    Heart disease Father 30       coronary stent   Other Brother        colostomy    Alzheimer's disease Maternal Grandmother 50   Heart disease Maternal Grandfather    Heart disease Paternal Grandmother    Colon cancer Neg Hx    Colon polyps Neg Hx    Esophageal cancer Neg Hx    Rectal cancer Neg Hx    Stomach cancer Neg Hx    Pancreatic cancer Neg Hx     Medications- reviewed and updated Current Outpatient Medications  Medication Sig Dispense Refill   Ascorbic Acid (VITAMIN C) 100 MG CHEW      aspirin  81 MG chewable tablet Chew 81 mg by mouth daily.     cetirizine (ZYRTEC) 10 MG tablet Take 10 mg by mouth.     Cholecalciferol  1.25 MG (50000 UT) TABS Take 1 tablet by mouth daily.     Cyanocobalamin  (VITAMIN B-12 PO) Take 1,200 mcg by mouth. Pt only  taking twice a week     estradiol  (ESTRACE ) 0.1 MG/GM vaginal cream Place 1 Applicatorful vaginally 2 (two) times a week.     glucose blood (ONETOUCH VERIO) test strip Use as instructed to check blood sugar up to 3 times daily 300 each 0   Magnesium 250 MG TABS Take 1 tablet by mouth daily at 2 PM.     METAMUCIL FIBER PO Take by mouth.     OneTouch Delica Lancets 33G MISC Use daily as needed to check blood sugar. 100 each 2   No current facility-administered medications for this visit.    Allergies-reviewed and updated Allergies  Allergen Reactions   Tetanus Toxoids Anaphylaxis    tetanus   Ceclor [Cefaclor] Hives   Codeine Hives   Doxycycline  Other (See Comments)    Hives   Penicillins Hives  Sulfa Antibiotics Hives   Lisinopril Other (See Comments)    fatigue    Social History   Socioeconomic History   Marital status: Single    Spouse name: Not on file   Number of children: 2   Years of education: Not on file   Highest education level: Not on file  Occupational History   Occupation: clerical    Employer: [employer]    Comment: [employer]  Tobacco Use   Smoking status: Never   Smokeless tobacco: Never  Vaping Use   Vaping status: Never Used  Substance and Sexual Activity   Alcohol use: No    Alcohol/week: 0.0 standard drinks of alcohol   Drug use: No   Sexual activity: Never  Other Topics Concern   Not on file  Social History Narrative   She lives with her grown son. Human resources for [employer]   Social Drivers of Health   Financial Resource Strain: Low Risk  (01/11/2024)   Overall Financial Resource Strain (CARDIA)    Difficulty of Paying Living Expenses: Not hard at all  Food Insecurity: No Food Insecurity (01/11/2024)   Hunger Vital Sign    Worried About Running Out of Food in the Last Year: Never true    Ran Out of Food in the Last Year: Never true  Transportation Needs: No Transportation Needs (01/11/2024)   PRAPARE -  Administrator, Civil Service (Medical): No    Lack of Transportation (Non-Medical): No  Physical Activity: Sufficiently Active (01/11/2024)   Exercise Vital Sign    Days of Exercise per Week: 4 days    Minutes of Exercise per Session: 60 min  Stress: No Stress Concern Present (01/11/2024)   [employer] of Occupational Health - Occupational Stress Questionnaire    Feeling of Stress: Not at all  Social Connections: Moderately Integrated (01/11/2024)   Social Connection and Isolation Panel    Frequency of Communication with Friends and Family: More than three times a week    Frequency of Social Gatherings with Friends and Family: More than three times a week    Attends Religious Services: More than 4 times per year    Active Member of [employer] or Organizations: Yes    Attends Banker Meetings: Never    Marital Status: Never married        Objective:  Physical Exam: BP 130/80   Pulse 87   Temp (!) 97.5 F (36.4 C) (Temporal)   Ht 5' 4 (1.626 m)   Wt 140 lb 9.6 oz (63.8 kg)   SpO2 100%   BMI 24.13 kg/m   Body mass index is 24.13 kg/m. Wt Readings from Last 3 Encounters:  01/11/24 140 lb 9.6 oz (63.8 kg)  12/04/23 142 lb 9.6 oz (64.7 kg)  10/08/23 142 lb (64.4 kg)   Gen: NAD, resting comfortably HEENT: TMs normal bilaterally. OP clear. No thyromegaly noted.  CV: RRR with no murmurs appreciated Pulm: NWOB, CTAB with no crackles, wheezes, or rhonchi GI: Normal bowel sounds present. Soft, Nontender, Nondistended. MSK: no edema, cyanosis, or clubbing noted Skin: warm, dry Neuro: CN2-12 grossly intact. Strength 5/5 in upper and lower extremities. Reflexes symmetric and intact bilaterally.  Psych: Normal affect and thought content     Orris Perin M. Kennyth, MD 01/11/2024 9:03 AM

## 2024-01-11 NOTE — Assessment & Plan Note (Signed)
 Not currently on any medications.  She is working on diet and exercise.  Recheck A1c today.

## 2024-01-11 NOTE — Assessment & Plan Note (Signed)
 Check B12

## 2024-01-11 NOTE — Assessment & Plan Note (Signed)
 Check vitamin D.

## 2024-01-11 NOTE — Patient Instructions (Signed)
 It was very nice to see you today!  We will check blood work today.  Please continue to work on diet and exercise.  Let us  know if you change your mind after vaccines.  Please send your Cologuard testing kit in soon.  Please monitor your blood pressure at home and let us  know if persistently elevated.  Will see back in 6 months for follow-up visit for your diabetes however we may need to see you back sooner depending on your results today.  I will see you back in 1 year for your next physical.  Return in about 6 months (around 07/12/2024) for Follow Up.   Take care, Dr Kennyth  PLEASE NOTE:  If you had any lab tests, please let us  know if you have not heard back within a few days. You may see your results on mychart before we have a chance to review them but we will give you a call once they are reviewed by us .   If we ordered any referrals today, please let us  know if you have not heard from their office within the next week.   If you had any urgent prescriptions sent in today, please check with the pharmacy within an hour of our visit to make sure the prescription was transmitted appropriately.   Please try these tips to maintain a healthy lifestyle:  Eat at least 3 REAL meals and 1-2 snacks per day.  Aim for no more than 5 hours between eating.  If you eat breakfast, please do so within one hour of getting up.   Each meal should contain half fruits/vegetables, one quarter protein, and one quarter carbs (no bigger than a computer mouse)  Cut down on sweet beverages. This includes juice, soda, and sweet tea.   Drink at least 1 glass of water with each meal and aim for at least 8 glasses per day  Exercise at least 150 minutes every week.    Preventive Care 80-56 Years Old, Female Preventive care refers to lifestyle choices and visits with your health care provider that can promote health and wellness. Preventive care visits are also called wellness exams. What can I expect for  my preventive care visit? Counseling Your health care provider may ask you questions about your: Medical history, including: Past medical problems. Family medical history. Pregnancy history. Current health, including: Menstrual cycle. Method of birth control. Emotional well-being. Home life and relationship well-being. Sexual activity and sexual health. Lifestyle, including: Alcohol, nicotine or tobacco, and drug use. Access to firearms. Diet, exercise, and sleep habits. Work and work Astronomer. Sunscreen use. Safety issues such as seatbelt and bike helmet use. Physical exam Your health care provider will check your: Height and weight. These may be used to calculate your BMI (body mass index). BMI is a measurement that tells if you are at a healthy weight. Waist circumference. This measures the distance around your waistline. This measurement also tells if you are at a healthy weight and may help predict your risk of certain diseases, such as type 2 diabetes and high blood pressure. Heart rate and blood pressure. Body temperature. Skin for abnormal spots. What immunizations do I need?  Vaccines are usually given at various ages, according to a schedule. Your health care provider will recommend vaccines for you based on your age, medical history, and lifestyle or other factors, such as travel or where you work. What tests do I need? Screening Your health care provider may recommend screening tests for certain conditions. This  may include: Lipid and cholesterol levels. Diabetes screening. This is done by checking your blood sugar (glucose) after you have not eaten for a while (fasting). Pelvic exam and Pap test. Hepatitis B test. Hepatitis C test. HIV (human immunodeficiency virus) test. STI (sexually transmitted infection) testing, if you are at risk. Lung cancer screening. Colorectal cancer screening. Mammogram. Talk with your health care provider about when you should  start having regular mammograms. This may depend on whether you have a family history of breast cancer. BRCA-related cancer screening. This may be done if you have a family history of breast, ovarian, tubal, or peritoneal cancers. Bone density scan. This is done to screen for osteoporosis. Talk with your health care provider about your test results, treatment options, and if necessary, the need for more tests. Follow these instructions at home: Eating and drinking  Eat a diet that includes fresh fruits and vegetables, whole grains, lean protein, and low-fat dairy products. Take vitamin and mineral supplements as recommended by your health care provider. Do not drink alcohol if: Your health care provider tells you not to drink. You are pregnant, may be pregnant, or are planning to become pregnant. If you drink alcohol: Limit how much you have to 0-1 drink a day. Know how much alcohol is in your drink. In the [place] one drink equals one 12 oz bottle of beer (355 mL), one 5 oz glass of wine (148 mL), or one 1 oz glass of hard liquor (44 mL). Lifestyle Brush your teeth every morning and night with fluoride  toothpaste. Floss one time each day. Exercise for at least 30 minutes 5 or more days each week. Do not use any products that contain nicotine or tobacco. These products include cigarettes, chewing tobacco, and vaping devices, such as e-cigarettes. If you need help quitting, ask your health care provider. Do not use drugs. If you are sexually active, practice safe sex. Use a condom or other form of protection to prevent STIs. If you do not wish to become pregnant, use a form of birth control. If you plan to become pregnant, see your health care provider for a prepregnancy visit. Take aspirin  only as told by your health care provider. Make sure that you understand how much to take and what form to take. Work with your health care provider to find out whether it is safe and beneficial for you to  take aspirin  daily. Find healthy ways to manage stress, such as: Meditation, yoga, or listening to music. Journaling. Talking to a trusted person. Spending time with friends and family. Minimize exposure to UV radiation to reduce your risk of skin cancer. Safety Always wear your seat belt while driving or riding in a vehicle. Do not drive: If you have been drinking alcohol. Do not ride with someone who has been drinking. When you are tired or distracted. While texting. If you have been using any mind-altering substances or drugs. Wear a helmet and other protective equipment during sports activities. If you have firearms in your house, make sure you follow all gun safety procedures. Seek help if you have been physically or sexually abused. What's next? Visit your health care provider once a year for an annual wellness visit. Ask your health care provider how often you should have your eyes and teeth checked. Stay up to date on all vaccines. This information is not intended to replace advice given to you by your health care provider. Make sure you discuss any questions you have with your health care  provider. Document Revised: 01/02/2021 Document Reviewed: 01/02/2021 Elsevier Patient Education  2024 [employer].

## 2024-01-11 NOTE — Assessment & Plan Note (Signed)
 Initially elevated however at goal on recheck without medications.

## 2024-01-11 NOTE — Assessment & Plan Note (Signed)
 She has been following with gynecology for this.  They are discussing putting her on hormone replacement though she is currently doing well with vaginal Estrace .  Will check labs today per patient request including CBC, c-Met, thyroid  labs, B12, vitamin D , magnesium, zinc, CRP, and sed rate.

## 2024-01-11 NOTE — Assessment & Plan Note (Signed)
 Check CBC and iron panel.

## 2024-01-11 NOTE — Assessment & Plan Note (Signed)
 She had to reschedule her most recent cardiology appointment and will be seeing them again in a couple of months.  She has been working on last interventions and would like to avoid statin if possible.  Check labs today.

## 2024-01-12 LAB — APOLIPOPROTEIN B: Apolipoprotein B: 106 mg/dL — ABNORMAL HIGH (ref <90)

## 2024-01-15 LAB — ANTI-TPO AB (RDL): Anti-TPO Ab (RDL): <9 [IU]/mL (ref <9.0)

## 2024-01-18 ENCOUNTER — Other Ambulatory Visit: Payer: Self-pay | Admitting: *Deleted

## 2024-01-18 ENCOUNTER — Ambulatory Visit: Payer: Self-pay | Admitting: Family Medicine

## 2024-01-18 DIAGNOSIS — E538 Deficiency of other specified B group vitamins: Secondary | ICD-10-CM | POA: Insufficient documentation

## 2024-01-18 DIAGNOSIS — R748 Abnormal levels of other serum enzymes: Secondary | ICD-10-CM

## 2024-01-18 LAB — ZINC: Zinc: 84 ug/dL (ref 60–130)

## 2024-01-18 LAB — LIPOPROTEIN A (LPA): Lipoprotein (a): 40 nmol/L (ref <75)

## 2024-01-18 LAB — THYROGLOBULIN ANTIBODY: Thyroglobulin Ab: <1 [IU]/mL (ref <=1)

## 2024-01-18 LAB — T3, REVERSE: T3, Reverse: 14 ng/dL (ref 8–25)

## 2024-01-18 NOTE — Progress Notes (Signed)
 Her apolipoprotein B is high.  This increases her risk for heart attack and stroke.  She also has slight elevation in her LDL.  She needs to continue to work on diet and exercise and work with cardiology about risk reduction strategies.  Her urine sample indicates that she does have protein in her urine.  This is due to her blood sugar.  We need to work on improving her blood sugar control.  She should continue to work on diet and exercise and we should recheck in 3 months however she may benefit from starting a medication to improve her sugar and low risk of complication from diabetes.  Recommend that she come in to discuss treatment options.  Her liver numbers were mildly elevated.  Please have her come back to recheck c-Met.  Please place future order.  Her folate is low.  Recommend she start over-the-counter folate supplement.  We should recheck in 3 to 6 months.  The rest of her labs are all at goal.  We can recheck in a year.

## 2024-01-19 ENCOUNTER — Encounter: Payer: Self-pay | Admitting: Family Medicine

## 2024-01-19 ENCOUNTER — Ambulatory Visit: Admitting: Family Medicine

## 2024-01-19 ENCOUNTER — Telehealth: Payer: Self-pay | Admitting: Family Medicine

## 2024-01-19 ENCOUNTER — Telehealth: Payer: Self-pay

## 2024-01-19 ENCOUNTER — Other Ambulatory Visit (HOSPITAL_BASED_OUTPATIENT_CLINIC_OR_DEPARTMENT_OTHER): Payer: Self-pay

## 2024-01-19 VITALS — BP 118/82 | HR 89 | Temp 98.1°F | Ht 64.0 in | Wt 142.6 lb

## 2024-01-19 DIAGNOSIS — R748 Abnormal levels of other serum enzymes: Secondary | ICD-10-CM | POA: Diagnosis not present

## 2024-01-19 DIAGNOSIS — Z79890 Hormone replacement therapy (postmenopausal): Secondary | ICD-10-CM | POA: Diagnosis not present

## 2024-01-19 DIAGNOSIS — N951 Menopausal and female climacteric states: Secondary | ICD-10-CM | POA: Diagnosis not present

## 2024-01-19 DIAGNOSIS — E1169 Type 2 diabetes mellitus with other specified complication: Secondary | ICD-10-CM

## 2024-01-19 DIAGNOSIS — I152 Hypertension secondary to endocrine disorders: Secondary | ICD-10-CM

## 2024-01-19 DIAGNOSIS — E785 Hyperlipidemia, unspecified: Secondary | ICD-10-CM

## 2024-01-19 DIAGNOSIS — E1159 Type 2 diabetes mellitus with other circulatory complications: Secondary | ICD-10-CM | POA: Diagnosis not present

## 2024-01-19 DIAGNOSIS — R6889 Other general symptoms and signs: Secondary | ICD-10-CM

## 2024-01-19 DIAGNOSIS — E119 Type 2 diabetes mellitus without complications: Secondary | ICD-10-CM | POA: Diagnosis not present

## 2024-01-19 DIAGNOSIS — E538 Deficiency of other specified B group vitamins: Secondary | ICD-10-CM

## 2024-01-19 LAB — COMPREHENSIVE METABOLIC PANEL WITH GFR
ALT: 43 U/L — ABNORMAL HIGH (ref 0–35)
AST: 19 U/L (ref 0–37)
Albumin: 4.8 g/dL (ref 3.5–5.2)
Alkaline Phosphatase: 119 U/L — ABNORMAL HIGH (ref 39–117)
BUN: 14 mg/dL (ref 6–23)
CO2: 27 meq/L (ref 19–32)
Calcium: 9.8 mg/dL (ref 8.4–10.5)
Chloride: 100 meq/L (ref 96–112)
Creatinine, Ser: 0.69 mg/dL (ref 0.40–1.20)
GFR: 100.41 mL/min (ref >60.00)
Glucose, Bld: 156 mg/dL — ABNORMAL HIGH (ref 70–99)
Potassium: 3.7 meq/L (ref 3.5–5.1)
Sodium: 136 meq/L (ref 135–145)
Total Bilirubin: 0.4 mg/dL (ref 0.2–1.2)
Total Protein: 7.6 g/dL (ref 6.0–8.3)

## 2024-01-19 MED ORDER — DEXCOM G7 SENSOR MISC
3 refills | Status: DC
  Filled 2024-01-19 – 2024-01-26 (×2): qty 3, 30d supply, fill #0
  Filled 2024-02-02: qty 1, 10d supply, fill #0

## 2024-01-19 NOTE — Telephone Encounter (Signed)
 Pt called back stating she is needing this letter today. Informed pt provider is already out of the office & will have them look into this tomorrow morning.

## 2024-01-19 NOTE — Assessment & Plan Note (Signed)
 She is still having claudication symptoms with this.  Her ABIs from a few months ago showed abnormal TBI bilaterally.  She will call to schedule appoint with vascular surgery soon.

## 2024-01-19 NOTE — Assessment & Plan Note (Signed)
 Elevated LDL and apolipoprotein B on recent labs.  She would like to avoid statins if at all possible.  We did discuss lifestyle modifications today.  She will follow-up with cardiology soon to discuss alternative treatment options.

## 2024-01-19 NOTE — Assessment & Plan Note (Signed)
 Had lengthy discussion with patient today regarding her recent A1c is 6.9.  We did discuss treatment options including restarting metformin   or GLP-1 agonist.  We did discuss potential benefits of starting GLP-1 agonist such as Mounjaro including weight loss and cardiovascular protection.  She would like to hold off on medications for now.  She would like to continue to work on diet and exercise though she will let us  know if she changes her mind about the Mounjaro.  She will come back in 3 months to recheck A1c.

## 2024-01-19 NOTE — Telephone Encounter (Signed)
 Pharmacy Patient Advocate Encounter   Received notification from CoverMyMeds that prior authorization for Dexcom G7 Sensor is required/requested.   Insurance verification completed.   The patient is insured through [facility] .   Per test claim: PA required; PA started via CoverMyMeds. KEY [id number] . Waiting for clinical questions to populate.

## 2024-01-19 NOTE — Telephone Encounter (Signed)
 Patient stated at check out that she forgot to ask PCP if he could write her a work note stating that she was excused to be out from 6/30-7/2, returning to work on 7/3. Please advise.

## 2024-01-19 NOTE — Progress Notes (Signed)
   Selena Holt is a 53 y.o. female who presents today for an office visit.  Assessment/Plan:  Chronic Problems Addressed Today: T2DM (type 2 diabetes mellitus) (HCC) Had lengthy discussion with patient today regarding her recent A1c is 6.9.  We did discuss treatment options including restarting metformin   or GLP-1 agonist.  We did discuss potential benefits of starting GLP-1 agonist such as Mounjaro including weight loss and cardiovascular protection.  She would like to hold off on medications for now.  She would like to continue to work on diet and exercise though she will let us  know if she changes her mind about the Mounjaro.  She will come back in 3 months to recheck A1c.  Hypertension associated with diabetes (HCC) At goal today without meds.  Menopausal symptoms Follows with gynecology.  Recently started on HRT.  She is doing well with this.  Folate deficiency She will start over-the-counter folate supplementation.  Recheck in 3 months.  Abnormal ankle brachial index (ABI) She is still having claudication symptoms with this.  Her ABIs from a few months ago showed abnormal TBI bilaterally.  She will call to schedule appoint with vascular surgery soon.  Dyslipidemia associated with type 2 diabetes mellitus (HCC) Elevated LDL and apolipoprotein B on recent labs.  She would like to avoid statins if at all possible.  We did discuss lifestyle modifications today.  She will follow-up with cardiology soon to discuss alternative treatment options.     Subjective:  HPI:  See A/P for status of chronic conditions.  Patient is here today for follow-up.  I saw her about a week ago for her annual physical.  We checked numerous labs at that time which were notable for elevated A1c at 6.9.  Also found to have elevated lipoprotein a and elevated LDL.  Also incidentally found to have low folate and elevated ALT.       Objective:  Physical Exam: BP 118/82   Pulse 89   Temp 98.1 F (36.7 C)    Ht 5' 4 (1.626 m)   Wt 142 lb 9.6 oz (64.7 kg)   SpO2 99%   BMI 24.48 kg/m   Gen: No acute distress, resting comfortably Neuro: Grossly normal, moves all extremities Psych: Normal affect and thought content      Priyansh Pry M. Kennyth, MD 01/19/2024 1:20 PM

## 2024-01-19 NOTE — Assessment & Plan Note (Signed)
 She will start over-the-counter folate supplementation.  Recheck in 3 months.

## 2024-01-19 NOTE — Assessment & Plan Note (Signed)
 Follows with gynecology.  Recently started on HRT.  She is doing well with this.

## 2024-01-19 NOTE — Patient Instructions (Addendum)
 It was very nice to see you today!  Please let me know if you would like to start Mounjaro.  I will send prescription in for Dexcom.  Please continue to work on diet and exercise.  I will see you back in 3 months.  Please come back sooner if needed.  Return in about 3 months (around 04/20/2024).   Take care, Dr Kennyth  PLEASE NOTE:  If you had any lab tests, please let us  know if you have not heard back within a few days. You may see your results on mychart before we have a chance to review them but we will give you a call once they are reviewed by us .   If we ordered any referrals today, please let us  know if you have not heard from their office within the next week.   If you had any urgent prescriptions sent in today, please check with the pharmacy within an hour of our visit to make sure the prescription was transmitted appropriately.   Please try these tips to maintain a healthy lifestyle:  Eat at least 3 REAL meals and 1-2 snacks per day.  Aim for no more than 5 hours between eating.  If you eat breakfast, please do so within one hour of getting up.   Each meal should contain half fruits/vegetables, one quarter protein, and one quarter carbs (no bigger than a computer mouse)  Cut down on sweet beverages. This includes juice, soda, and sweet tea.   Drink at least 1 glass of water with each meal and aim for at least 8 glasses per day  Exercise at least 150 minutes every week.

## 2024-01-19 NOTE — Assessment & Plan Note (Signed)
At goal today without meds. 

## 2024-01-20 ENCOUNTER — Ambulatory Visit: Payer: Self-pay | Admitting: Family Medicine

## 2024-01-20 NOTE — Progress Notes (Signed)
 Her sugar is elevated but her liver numbers are improving.  We can recheck at her next office visit here.  Do not need to do any other testing at this point.

## 2024-01-20 NOTE — Telephone Encounter (Signed)
 I sent a letter on her MyChart.

## 2024-01-21 ENCOUNTER — Other Ambulatory Visit (HOSPITAL_BASED_OUTPATIENT_CLINIC_OR_DEPARTMENT_OTHER): Payer: Self-pay

## 2024-01-25 ENCOUNTER — Other Ambulatory Visit (HOSPITAL_BASED_OUTPATIENT_CLINIC_OR_DEPARTMENT_OTHER): Payer: Self-pay

## 2024-01-25 NOTE — Telephone Encounter (Signed)
 Questions answered.

## 2024-01-26 ENCOUNTER — Other Ambulatory Visit (HOSPITAL_BASED_OUTPATIENT_CLINIC_OR_DEPARTMENT_OTHER): Payer: Self-pay

## 2024-01-26 ENCOUNTER — Other Ambulatory Visit (HOSPITAL_COMMUNITY): Payer: Self-pay

## 2024-01-27 ENCOUNTER — Other Ambulatory Visit: Payer: Self-pay

## 2024-01-27 ENCOUNTER — Other Ambulatory Visit (HOSPITAL_COMMUNITY): Payer: Self-pay

## 2024-01-27 ENCOUNTER — Other Ambulatory Visit (HOSPITAL_BASED_OUTPATIENT_CLINIC_OR_DEPARTMENT_OTHER): Payer: Self-pay

## 2024-01-27 NOTE — Telephone Encounter (Signed)
 Pharmacy Patient Advocate Encounter  Received notification from [facility] that Prior Authorization for Dexcom G7 Sensor  has been DENIED.  Full denial letter will be uploaded to the media tab. See denial reason below.   PA #/Case ID/Reference #: [id number]

## 2024-01-29 ENCOUNTER — Other Ambulatory Visit (HOSPITAL_BASED_OUTPATIENT_CLINIC_OR_DEPARTMENT_OTHER): Payer: Self-pay

## 2024-01-29 DIAGNOSIS — F4310 Post-traumatic stress disorder, unspecified: Secondary | ICD-10-CM | POA: Diagnosis not present

## 2024-02-01 ENCOUNTER — Other Ambulatory Visit: Payer: Self-pay

## 2024-02-01 ENCOUNTER — Other Ambulatory Visit (HOSPITAL_BASED_OUTPATIENT_CLINIC_OR_DEPARTMENT_OTHER): Payer: Self-pay

## 2024-02-01 MED ORDER — HYDROCORT-PRAMOXINE (PERIANAL) 2.5-1 % EX CREA
TOPICAL_CREAM | CUTANEOUS | 1 refills | Status: AC
  Filled 2024-02-01: qty 30, 15d supply, fill #0

## 2024-02-01 NOTE — Telephone Encounter (Signed)
 LVM with message Prior Authorization for Dexcom G7 Sensor  has been DENIED. Please contact insurance for alternatives

## 2024-02-02 ENCOUNTER — Other Ambulatory Visit (HOSPITAL_BASED_OUTPATIENT_CLINIC_OR_DEPARTMENT_OTHER): Payer: Self-pay

## 2024-02-16 ENCOUNTER — Other Ambulatory Visit (HOSPITAL_BASED_OUTPATIENT_CLINIC_OR_DEPARTMENT_OTHER): Payer: Self-pay

## 2024-02-19 DIAGNOSIS — F4310 Post-traumatic stress disorder, unspecified: Secondary | ICD-10-CM | POA: Diagnosis not present

## 2024-02-19 DIAGNOSIS — R6882 Decreased libido: Secondary | ICD-10-CM | POA: Diagnosis not present

## 2024-02-19 DIAGNOSIS — Z79890 Hormone replacement therapy (postmenopausal): Secondary | ICD-10-CM | POA: Diagnosis not present

## 2024-02-27 ENCOUNTER — Encounter: Payer: Self-pay | Admitting: Family Medicine

## 2024-02-29 NOTE — Telephone Encounter (Signed)
**Note De-identified  [place] Obfuscation** Please advise 

## 2024-02-29 NOTE — Telephone Encounter (Signed)
 Can we have her come in to discuss? Sounds like she has tried most OTC medications. Next step would be a prescription or referral to gastroenterology.  It is ok to order a bone density scan.   Worth HERO. Kennyth, MD 02/29/2024 10:37 AM

## 2024-03-01 DIAGNOSIS — F4310 Post-traumatic stress disorder, unspecified: Secondary | ICD-10-CM | POA: Diagnosis not present

## 2024-03-10 DIAGNOSIS — N760 Acute vaginitis: Secondary | ICD-10-CM | POA: Diagnosis not present

## 2024-03-10 DIAGNOSIS — Z113 Encounter for screening for infections with a predominantly sexual mode of transmission: Secondary | ICD-10-CM | POA: Diagnosis not present

## 2024-03-10 DIAGNOSIS — R300 Dysuria: Secondary | ICD-10-CM | POA: Diagnosis not present

## 2024-03-15 DIAGNOSIS — F4310 Post-traumatic stress disorder, unspecified: Secondary | ICD-10-CM | POA: Diagnosis not present

## 2024-03-18 ENCOUNTER — Encounter (HOSPITAL_BASED_OUTPATIENT_CLINIC_OR_DEPARTMENT_OTHER): Payer: Self-pay | Admitting: Cardiology

## 2024-03-18 ENCOUNTER — Ambulatory Visit (HOSPITAL_BASED_OUTPATIENT_CLINIC_OR_DEPARTMENT_OTHER): Admitting: Cardiology

## 2024-03-18 VITALS — BP 140/80 | HR 88 | Resp 17 | Ht 64.0 in | Wt 138.0 lb

## 2024-03-18 DIAGNOSIS — E7800 Pure hypercholesterolemia, unspecified: Secondary | ICD-10-CM | POA: Diagnosis not present

## 2024-03-18 DIAGNOSIS — H93A9 Pulsatile tinnitus, unspecified ear: Secondary | ICD-10-CM

## 2024-03-18 DIAGNOSIS — E119 Type 2 diabetes mellitus without complications: Secondary | ICD-10-CM | POA: Diagnosis not present

## 2024-03-18 DIAGNOSIS — Z712 Person consulting for explanation of examination or test findings: Secondary | ICD-10-CM

## 2024-03-18 DIAGNOSIS — Z7189 Other specified counseling: Secondary | ICD-10-CM

## 2024-03-18 DIAGNOSIS — I6521 Occlusion and stenosis of right carotid artery: Secondary | ICD-10-CM

## 2024-03-18 DIAGNOSIS — I10 Essential (primary) hypertension: Secondary | ICD-10-CM

## 2024-03-18 NOTE — Progress Notes (Signed)
 Cardiology Office Note:  .   Date:  03/18/2024  ID:  Selena Holt, DOB 02/25/1971, MRN [id number] PCP: Kennyth Worth HERO, MD  [facility] Providers Cardiologist:  Shelda Bruckner, MD {  History of Present Illness: .   Selena Holt is a 53 y.o. female with PMH type II diabetes, hypertension, hyperlipidemia, carotid stenosis. She was previously seen by Dr. Michele and Dr. Swaz[place] and established care with me on 03/18/24.  Pertinent CV history: Carotid dopplers showed 50-69% bilateral carotid stenosis, seen by Dr. Serene, CTA with 50% R ICA stenosis, MRA without intracradial abnormality for pulsatile tinnitus, following annually. Has claudication when walking uphill, ABIs checked, normal. Also notes intermittent chest aching. Ordered for CT coronary but was not performed.  Today: Notes reviewed, summarized as above. Recent note from Dr. Kennyth also reviewed.  Brings a list of concerns today. She wants to treat her CV risk naturally, wants to avoid statins. Feels cold all the time, labs have been fine. Reviewed her prior ABI/arterial dopplers. Has pulsatile tinnitus, saw vascular, has a bruit. Started HRT in June, blood pressure has improved on her home numbers, always higher in the office. Has had some borderline low numbers but no symptoms at home. Was told that she should have a calcium  score, has questions about this.   Discussed lifestyle recommendations. She is working on diet changes, was eating a lot of fast food and is now changing her diet to heart healthy. Discussed Mediterranean diet today. Does get more than 150 minutes/week of activity.  Discussed calcium  score, data for statins based on her known plaque. Discussed that if calcium  score wouldn't change her perspective, then no utility to it at this time.  Noted that her liver was abnormal during Covid, has been following with GI for this since, concerns with statin with liver disease. Has been recommended for GLP for  her steatosis, but she is concerned about weight loss as her BMI is already 23.  Updated FH today. Dad was 53 when he had his stent.  Was told her heart rate was fast as a child, was on metoprolol .  ROS: Denies chest pain, shortness of breath at rest or with normal exertion. No PND, orthopnea, LE edema or unexpected weight gain. No syncope or palpitations. ROS otherwise negative except as noted.   Studies Reviewed: SABRA    EKG:       Physical Exam:   VS:  BP (!) 140/80 (BP Location: Left Arm, Patient Position: Sitting, Cuff Size: Normal)   Pulse 88   Resp 17   Ht 5' 4 (1.626 m)   Wt 138 lb (62.6 kg)   SpO2 100%   BMI 23.69 kg/m    Wt Readings from Last 3 Encounters:  03/18/24 138 lb (62.6 kg)  01/19/24 142 lb 9.6 oz (64.7 kg)  01/11/24 140 lb 9.6 oz (63.8 kg)    GEN: Well nourished, well developed in no acute distress HEENT: Normal, moist mucous membranes NECK: No JVD CARDIAC: regular rhythm, normal S1 and S2, no rubs or gallops. No murmur. VASCULAR: Radial and DP pulses 2+ bilaterally. +R carotid bruit RESPIRATORY:  Clear to auscultation without rales, wheezing or rhonchi  ABDOMEN: Soft, non-tender, non-distended MUSCULOSKELETAL:  Ambulates independently SKIN: Warm and dry, no edema NEUROLOGIC:  Alert and oriented x 3. No focal neuro deficits noted. PSYCHIATRIC:  Normal affect    ASSESSMENT AND PLAN: .    Carotid bruit, with nonobstructive stenosis and carotid artery plaque Hypercholesterolemia Family history of  heart disease -we reviewed her prior testing, including lower extremity dopplers (normal), ABI (abnormal but only due to cold toes, dopplers normal), carotid doppler, CT head/neck -we spent extensive time today discussing plaque, including biology of this, recommendations/data for medical management, extensive discussion on statins, discussion on available data for supplements. -she would like to pursue lifestyle management at this time. If she elects to pursue  statin, she will contact me in the interim  Hypertension -reports improved control at home. Currently not on antihypertensives. Discussed lifestyle as above, continue to monitor  Pulsatile tinnitus -MRA unremarkable  CV risk counseling and prevention -recommend heart healthy/Mediterranean diet, with whole grains, fruits, vegetable, fish, lean meats, nuts, and olive oil. Limit salt. -recommend moderate walking, 3-5 times/week for 30-50 minutes each session. Aim for at least 150 minutes/week. Goal should be pace of 3 miles/hours, or walking 1.5 miles in 30 minutes -recommend avoidance of tobacco products. Avoid excess alcohol.  Dispo: 1 year or sooner as needed  Total time of encounter: I spent 56 minutes dedicated to the care of this patient on the date of this encounter to include pre-visit review of records, face-to-face time with the patient discussing conditions above, and clinical documentation with the electronic health record. We specifically spent time today discussing prior testing, cholesterol/plaque findings, discussion of recommended medical management including extensive discussion on statins.   Signed, Shelda Bruckner, MD   Shelda Bruckner, MD, PhD, [facility]  [facility]  [facility] [address] [place], [place] [address] [phone]

## 2024-03-18 NOTE — Patient Instructions (Addendum)
 Recent data, summarized from NPR from a study from the [facility]:  [employer].pt  Researchers at the [facility] set out to answer this question by comparing statins to supplements in a clinical trial. They tracked the outcomes of 190 adults, ages 79 to 29. Some participants were given a 5 mg daily dose of rosuvastatin , a statin that is sold under the brand name Crestor  for 28 days. Others were given supplements, including fish oil, cinnamon, garlic, turmeric, plant sterols or red yeast rice for the same period.  What we found was that rosuvastatin  lowered LDL cholesterol by almost 61% and that was vastly superior to placebo and any of the six supplements studied in the trial, study dino Herlene Hood, M.D. of the [facility] told NPR. He says this level of reduction is enough to lower the risk of heart attacks and strokes. The findings are published in the Journal of the [employer] of Cardiology.  Oftentimes these supplements are marketed as 'natural ways' to lower your cholesterol, says Laffin. But he says none of the dietary supplements demonstrated any significant decrease in LDL cholesterol compared with a placebo. LDL cholesterol is considered the 'bad cholesterol' because it can contribute to plaque build-up in the artery walls - which can narrow the arteries, and set the stage for heart attacks and strokes    FOLLOW UP IN 1 YEAR WITH DR CHRISTOPHER, CAITLIN W NP, OR MICHELLE S NP

## 2024-03-23 DIAGNOSIS — L308 Other specified dermatitis: Secondary | ICD-10-CM | POA: Diagnosis not present

## 2024-03-23 DIAGNOSIS — D2222 Melanocytic nevi of left ear and external auricular canal: Secondary | ICD-10-CM | POA: Diagnosis not present

## 2024-03-23 DIAGNOSIS — L658 Other specified nonscarring hair loss: Secondary | ICD-10-CM | POA: Diagnosis not present

## 2024-03-23 DIAGNOSIS — L820 Inflamed seborrheic keratosis: Secondary | ICD-10-CM | POA: Diagnosis not present

## 2024-03-29 LAB — COLOGUARD

## 2024-03-30 ENCOUNTER — Ambulatory Visit: Payer: Self-pay | Admitting: Family Medicine

## 2024-03-30 NOTE — Progress Notes (Signed)
 Please let patient know that her cologuard cannot be processed.  They will be contacting her to repeat the test.

## 2024-04-02 ENCOUNTER — Other Ambulatory Visit (HOSPITAL_BASED_OUTPATIENT_CLINIC_OR_DEPARTMENT_OTHER): Payer: Self-pay

## 2024-04-02 ENCOUNTER — Other Ambulatory Visit: Payer: Self-pay

## 2024-04-11 ENCOUNTER — Ambulatory Visit: Admitting: Family Medicine

## 2024-04-12 ENCOUNTER — Encounter: Payer: Self-pay | Admitting: Family Medicine

## 2024-04-12 ENCOUNTER — Ambulatory Visit: Admitting: Family Medicine

## 2024-04-12 VITALS — BP 146/79 | HR 83 | Temp 97.5°F | Ht 64.0 in | Wt 134.6 lb

## 2024-04-12 DIAGNOSIS — E559 Vitamin D deficiency, unspecified: Secondary | ICD-10-CM | POA: Diagnosis not present

## 2024-04-12 DIAGNOSIS — R29898 Other symptoms and signs involving the musculoskeletal system: Secondary | ICD-10-CM | POA: Diagnosis not present

## 2024-04-12 DIAGNOSIS — E1169 Type 2 diabetes mellitus with other specified complication: Secondary | ICD-10-CM | POA: Diagnosis not present

## 2024-04-12 DIAGNOSIS — E119 Type 2 diabetes mellitus without complications: Secondary | ICD-10-CM

## 2024-04-12 DIAGNOSIS — E538 Deficiency of other specified B group vitamins: Secondary | ICD-10-CM | POA: Diagnosis not present

## 2024-04-12 DIAGNOSIS — Z7985 Long-term (current) use of injectable non-insulin antidiabetic drugs: Secondary | ICD-10-CM | POA: Diagnosis not present

## 2024-04-12 DIAGNOSIS — I6523 Occlusion and stenosis of bilateral carotid arteries: Secondary | ICD-10-CM

## 2024-04-12 DIAGNOSIS — E1159 Type 2 diabetes mellitus with other circulatory complications: Secondary | ICD-10-CM | POA: Diagnosis not present

## 2024-04-12 DIAGNOSIS — R300 Dysuria: Secondary | ICD-10-CM | POA: Diagnosis not present

## 2024-04-12 DIAGNOSIS — F4310 Post-traumatic stress disorder, unspecified: Secondary | ICD-10-CM | POA: Diagnosis not present

## 2024-04-12 DIAGNOSIS — Z1211 Encounter for screening for malignant neoplasm of colon: Secondary | ICD-10-CM

## 2024-04-12 DIAGNOSIS — I152 Hypertension secondary to endocrine disorders: Secondary | ICD-10-CM

## 2024-04-12 DIAGNOSIS — E785 Hyperlipidemia, unspecified: Secondary | ICD-10-CM

## 2024-04-12 LAB — COMPREHENSIVE METABOLIC PANEL WITH GFR
ALT: 71 U/L — ABNORMAL HIGH (ref 0–35)
AST: 39 U/L — ABNORMAL HIGH (ref 0–37)
Albumin: 4.8 g/dL (ref 3.5–5.2)
Alkaline Phosphatase: 124 U/L — ABNORMAL HIGH (ref 39–117)
BUN: 15 mg/dL (ref 6–23)
CO2: 27 meq/L (ref 19–32)
Calcium: 9.9 mg/dL (ref 8.4–10.5)
Chloride: 101 meq/L (ref 96–112)
Creatinine, Ser: 0.71 mg/dL (ref 0.40–1.20)
GFR: 98.22 mL/min (ref >60.00)
Glucose, Bld: 146 mg/dL — ABNORMAL HIGH (ref 70–99)
Potassium: 4.1 meq/L (ref 3.5–5.1)
Sodium: 136 meq/L (ref 135–145)
Total Bilirubin: 0.5 mg/dL (ref 0.2–1.2)
Total Protein: 7.2 g/dL (ref 6.0–8.3)

## 2024-04-12 LAB — URINALYSIS, ROUTINE W REFLEX MICROSCOPIC
Bilirubin Urine: NEGATIVE
Hgb urine dipstick: NEGATIVE
Ketones, ur: NEGATIVE
Leukocytes,Ua: NEGATIVE
Nitrite: NEGATIVE
Specific Gravity, Urine: <=1.005 — AB (ref 1.000–1.030)
Total Protein, Urine: NEGATIVE
Urine Glucose: NEGATIVE
Urobilinogen, UA: 0.2 (ref 0.0–1.0)
pH: 6.5 (ref 5.0–8.0)

## 2024-04-12 LAB — CBC
HCT: 37.7 % (ref 36.0–46.0)
Hemoglobin: 12.9 g/dL (ref 12.0–15.0)
MCHC: 34.3 g/dL (ref 30.0–36.0)
MCV: 90.3 fl (ref 78.0–100.0)
Platelets: 272 K/uL (ref 150.0–400.0)
RBC: 4.18 Mil/uL (ref 3.87–5.11)
RDW: 13.1 % (ref 11.5–15.5)
WBC: 8.7 K/uL (ref 4.0–10.5)

## 2024-04-12 LAB — VITAMIN D 25 HYDROXY (VIT D DEFICIENCY, FRACTURES): VITD: 68.05 ng/mL (ref 30.00–100.00)

## 2024-04-12 LAB — VITAMIN B12: Vitamin B-12: 1113 pg/mL — ABNORMAL HIGH (ref 211–911)

## 2024-04-12 LAB — TSH: TSH: 2.54 u[IU]/mL (ref 0.35–5.50)

## 2024-04-12 LAB — HEMOGLOBIN A1C: Hgb A1c MFr Bld: 7.1 % — ABNORMAL HIGH (ref 4.6–6.5)

## 2024-04-12 NOTE — Progress Notes (Signed)
 Selena Holt is a 53 y.o. female who presents today for an office visit.  Assessment/Plan:  New/Acute Problems: Left Leg Weakness Patient with fairly complex neurologic history including septic meningitis in 2004 and aseptic meningitis in 2017.  She did have idiopathic foot drop in 2016 that resolved on its own.  She does have some evidence of atrophy along her left lateral leg as well as intermittent radicular symptoms including paresthesias.  This has been going on for at least the last couple of years.  Will check labs today and also lumbar MRI though depending on results of this we will likely need to have neurologic evaluation especially in light of her neurologic history.  Urinary Urgency No red flags.  No signs of systemic illness.  Check UA and urine culture.   Chronic Problems Addressed Today: T2DM (type 2 diabetes mellitus) (HCC) Check A1c today with blood work.  We did discuss treatment options including GLP agonist.  She may be interested in microdosing however discussed with patient that this would be difficult and likely more expensive than taking the standard dose of GLP-1 agonist such as Ozempic or Mounjaro.  She is working on lifestyle interventions.  There  Vitamin D  deficiency Check vitamin D .   Vitamin B 12 deficiency Check B12.   Hypertension associated with diabetes (HCC) Mildly elevated today.  She has been well-controlled at her recent office visit here.  She will monitor home and let us  know with persistently elevated.  Dyslipidemia associated with type 2 diabetes mellitus (HCC) Patient extremely reluctant to start statins.  She did recently discussed this extensively with cardiology as well.  She is working on lifestyle modifications.  Carotid artery stenosis She is following with vascular surgery in 2 months for this.  Recent MRA did not show any significant stenosis.     Subjective:  HPI:  See assessment / plan for status of chronic conditions.    Discussed the use of AI scribe software for clinical note transcription with the patient, who gave verbal consent to proceed.  History of Present Illness Selena Holt is a 53 year old female who presents with left leg muscle loss.  She has experienced significant muscle loss in her left leg over several years, with a noticeable increase recently. The left leg is two inches smaller in circumference compared to the right leg, accompanied by decreased strength. Muscle loss was first noticed in 2022 and appears to be worsening. She experienced foot drop on the left side in 2016, which resolved spontaneously. No significant back pain is reported, and she does not recall undergoing an MRI for this issue.  She experiences occasional tingling in her feet, described as a 'tingle', which has been a long-standing issue.  She reports symptoms suggestive of a urinary tract infection, including urethral pain and frequent urination every 30 minutes to an hour over the past week. The pain is described as a burning sensation with the urge to urinate, but not during urination itself. No fever, chills, or back pain are associated with these symptoms.  She mentions a long-standing issue with temperature perception, noting an inability to accurately feel temperature changes. This has been ongoing for an extended period.  Her past medical history includes bacterial meningitis in 2004 and viral meningitis secondary to shingles in 2017. She recalls seeing a neurologist in 2016 for foot drop and undergoing an EEG, but does not remember the details of the consultation or any follow-up recommendations.  Objective:  Physical Exam: BP (!) 146/79   Pulse 83   Temp (!) 97.5 F (36.4 C) (Temporal)   Ht 5' 4 (1.626 m)   Wt 134 lb 9.6 oz (61.1 kg)   SpO2 100%   BMI 23.10 kg/m   Gen: No acute distress, resting comfortably CV: Regular rate and rhythm with no murmurs appreciated Pulm: Normal work of  breathing, clear to auscultation bilaterally with no crackles, wheezes, or rhonchi MUSCULOSKELETAL: - Legs: No deformities.  4+ out of 5 strength with left hip flexion, extension, adduction, and abduction.  Neurovascular intact distally.  Sensation light touch intact throughout. Neuro: Grossly normal, moves all extremities Psych: Normal affect and thought content   Time Spent: 48 minutes of total time was spent on the date of the encounter performing the following actions: chart review prior to seeing the patient, reviewing her previous hospitalizations with meningitis and neurologic consultation, obtaining history, performing a medically necessary exam, counseling on the treatment plan, placing orders, and documenting in our EHR.       Worth HERO. Kennyth, MD 04/12/2024 9:30 AM

## 2024-04-12 NOTE — Assessment & Plan Note (Signed)
 Check B12

## 2024-04-12 NOTE — Assessment & Plan Note (Signed)
 She is following with vascular surgery in 2 months for this.  Recent MRA did not show any significant stenosis.

## 2024-04-12 NOTE — Assessment & Plan Note (Signed)
 Patient extremely reluctant to start statins.  She did recently discussed this extensively with cardiology as well.  She is working on lifestyle modifications.

## 2024-04-12 NOTE — Assessment & Plan Note (Signed)
 Check vitamin D.

## 2024-04-12 NOTE — Assessment & Plan Note (Signed)
 Mildly elevated today.  She has been well-controlled at her recent office visit here.  She will monitor home and let us  know with persistently elevated.

## 2024-04-12 NOTE — Assessment & Plan Note (Signed)
 Check A1c today with blood work.  We did discuss treatment options including GLP agonist.  She may be interested in microdosing however discussed with patient that this would be difficult and likely more expensive than taking the standard dose of GLP-1 agonist such as Ozempic or Mounjaro.  She is working on lifestyle interventions.  There

## 2024-04-12 NOTE — Patient Instructions (Signed)
 It was very nice to see you today!  VISIT SUMMARY: You visited us  today due to muscle loss in your left leg, abnormal temperature sensation, and urinary symptoms. We discussed potential causes and planned several tests to identify the underlying issues.  YOUR PLAN: LEFT LOWER EXTREMITY MUSCLE ATROPHY AND WEAKNESS: You have chronic muscle loss and weakness in your left leg, which may be due to nerve impingement or neurological issues. -We will perform an MRI of your back to check for nerve impingement. -Blood work will be done to identify any underlying causes. -If the MRI or blood work indicates, we will consider referring you to a neurologist.  ABNORMAL TEMPERATURE SENSATION: You have a long-standing issue with feeling temperature changes, which may be related to neurological issues. -Blood work will be done to identify any underlying causes. -If the blood work indicates, we will consider referring you to a neurologist.  URINARY SYMPTOMS, POSSIBLE URINARY TRACT INFECTION: You have symptoms that suggest a possible urinary tract infection. -We will perform a urinalysis to confirm if you have a UTI.   Return in about 3 months (around 07/12/2024) for Follow Up.   Take care, Dr Kennyth  PLEASE NOTE:  If you had any lab tests, please let us  know if you have not heard back within a few days. You may see your results on mychart before we have a chance to review them but we will give you a call once they are reviewed by us .   If we ordered any referrals today, please let us  know if you have not heard from their office within the next week.   If you had any urgent prescriptions sent in today, please check with the pharmacy within an hour of our visit to make sure the prescription was transmitted appropriately.   Please try these tips to maintain a healthy lifestyle:  Eat at least 3 REAL meals and 1-2 snacks per day.  Aim for no more than 5 hours between eating.  If you eat breakfast, please  do so within one hour of getting up.   Each meal should contain half fruits/vegetables, one quarter protein, and one quarter carbs (no bigger than a computer mouse)  Cut down on sweet beverages. This includes juice, soda, and sweet tea.   Drink at least 1 glass of water with each meal and aim for at least 8 glasses per day  Exercise at least 150 minutes every week.

## 2024-04-13 LAB — URINE CULTURE
MICRO NUMBER:: 17005105
SPECIMEN QUALITY:: ADEQUATE

## 2024-04-14 ENCOUNTER — Ambulatory Visit: Payer: Self-pay | Admitting: Family Medicine

## 2024-04-14 ENCOUNTER — Other Ambulatory Visit (HOSPITAL_BASED_OUTPATIENT_CLINIC_OR_DEPARTMENT_OTHER): Payer: Self-pay

## 2024-04-14 DIAGNOSIS — R748 Abnormal levels of other serum enzymes: Secondary | ICD-10-CM

## 2024-04-14 MED ORDER — ESTRADIOL 0.0375 MG/24HR TD PTTW
1.0000 | MEDICATED_PATCH | TRANSDERMAL | 2 refills | Status: DC
  Filled 2024-04-14 – 2024-04-27 (×2): qty 8, 28d supply, fill #0

## 2024-04-14 MED ORDER — ESTRADIOL 0.05 MG/24HR TD PTTW
1.0000 | MEDICATED_PATCH | TRANSDERMAL | 3 refills | Status: AC
  Filled 2024-04-14 – 2024-04-27 (×2): qty 24, 84d supply, fill #0
  Filled 2024-07-18: qty 24, 84d supply, fill #1

## 2024-04-14 NOTE — Progress Notes (Signed)
 Her liver numbers are slightly elevated again.  She should come back in a few weeks to recheck.  Please place future order for CMET.  Her A1c is stable at 7.1.  She should let us  know if she would like to start medication for this.  We should recheck again in 3 to 6 months.  Urine culture is negative for UTI.  The rest of her labs are all at goal and we can recheck in a year.

## 2024-04-14 NOTE — Telephone Encounter (Signed)
**Note De-identified  [place] Obfuscation** Please advise 

## 2024-04-15 NOTE — Telephone Encounter (Signed)
 I appreciate the update. I know we are still waiting on her MRI however it would probably be a good idea for her to see neurology.  Recommend referral if she is improving.

## 2024-04-18 ENCOUNTER — Telehealth: Payer: Self-pay | Admitting: Gastroenterology

## 2024-04-18 NOTE — Telephone Encounter (Signed)
 Received a call from patient stating she would like a nurse fu call regarding constipation she has been going through, schd for 11/11. Please review and advise

## 2024-04-18 NOTE — Telephone Encounter (Signed)
 Spoke with pt. She reports that she has been having issues with constipation. Reports very small, hard bms. Abd bloating and pain in the LUQ that radiates to her back. Reports trying colace, 2 mag citrate tablets at bedtime, and warmed prune and apple juice with melted butter but none of these things seem to be helping. Pt reports that Miralax causes her abdomen to feel very bloated so she would rather not try that if possible. Did recommend mag citrate. Pt has not been seen in clinic since 03/25/2022, however she has been scheduled for 11/11 which is the soonest she was available. Routing to provider to review and advise.

## 2024-04-19 NOTE — Telephone Encounter (Signed)
 Spoke with pt. Discussed trying colonoscopy prep per provider recommendations. Pt states that she still has prep at home from a previous procedure that had to be cancelled and wants to know if she could take that. She couldn't remember the name of the medication. Advised pt that she could try this medication and to call us  back with the results. Discussed with pt staying on a Colace or Dulcolax tablet BID to promote regular bowel movements. Pt verbalized understanding. Pt reports that she is still consuming a gluten free diet.

## 2024-04-20 ENCOUNTER — Ambulatory Visit: Admitting: Family Medicine

## 2024-04-21 ENCOUNTER — Encounter: Payer: Self-pay | Admitting: Family Medicine

## 2024-04-22 NOTE — Telephone Encounter (Signed)
Ok to order bone density scan

## 2024-04-25 ENCOUNTER — Other Ambulatory Visit (HOSPITAL_BASED_OUTPATIENT_CLINIC_OR_DEPARTMENT_OTHER): Payer: Self-pay

## 2024-04-25 NOTE — Telephone Encounter (Signed)
 Bone Density was schedule

## 2024-04-26 DIAGNOSIS — F4310 Post-traumatic stress disorder, unspecified: Secondary | ICD-10-CM | POA: Diagnosis not present

## 2024-04-27 ENCOUNTER — Other Ambulatory Visit (HOSPITAL_BASED_OUTPATIENT_CLINIC_OR_DEPARTMENT_OTHER): Payer: Self-pay

## 2024-05-02 ENCOUNTER — Ambulatory Visit: Admitting: Family Medicine

## 2024-05-02 ENCOUNTER — Other Ambulatory Visit

## 2024-05-04 ENCOUNTER — Other Ambulatory Visit: Payer: Self-pay | Admitting: Nurse Practitioner

## 2024-05-04 DIAGNOSIS — E538 Deficiency of other specified B group vitamins: Secondary | ICD-10-CM | POA: Diagnosis not present

## 2024-05-04 DIAGNOSIS — R748 Abnormal levels of other serum enzymes: Secondary | ICD-10-CM | POA: Diagnosis not present

## 2024-05-04 DIAGNOSIS — Z79890 Hormone replacement therapy (postmenopausal): Secondary | ICD-10-CM | POA: Diagnosis not present

## 2024-05-04 DIAGNOSIS — E119 Type 2 diabetes mellitus without complications: Secondary | ICD-10-CM | POA: Diagnosis not present

## 2024-05-04 DIAGNOSIS — K760 Fatty (change of) liver, not elsewhere classified: Secondary | ICD-10-CM

## 2024-05-04 DIAGNOSIS — Z79899 Other long term (current) drug therapy: Secondary | ICD-10-CM | POA: Diagnosis not present

## 2024-05-04 DIAGNOSIS — L658 Other specified nonscarring hair loss: Secondary | ICD-10-CM | POA: Diagnosis not present

## 2024-05-04 LAB — COMPREHENSIVE METABOLIC PANEL WITH GFR
Albumin: 4.5 (ref 3.5–5.0)
Calcium: 9.5 (ref 8.7–10.7)
Globulin: 2.4
eGFR: 89

## 2024-05-04 LAB — BASIC METABOLIC PANEL WITH GFR
BUN: 15 (ref 4–21)
CO2: 22 (ref 13–22)
Chloride: 97 — AB (ref 99–108)
Creatinine: 0.8 (ref 0.5–1.1)
Glucose: 183
Potassium: 4.2 meq/L (ref 3.5–5.1)
Sodium: 132 — AB (ref 137–147)

## 2024-05-04 LAB — HEPATIC FUNCTION PANEL
ALT: 51 U/L — AB (ref 7–35)
AST: 22 (ref 13–35)
Alkaline Phosphatase: 148 — AB (ref 25–125)
Bilirubin, Total: 0.5

## 2024-05-06 ENCOUNTER — Other Ambulatory Visit

## 2024-05-07 ENCOUNTER — Other Ambulatory Visit

## 2024-05-07 LAB — LAB REPORT - SCANNED: EGFR: 89

## 2024-05-09 ENCOUNTER — Ambulatory Visit
Admission: RE | Admit: 2024-05-09 | Discharge: 2024-05-09 | Disposition: A | Discharge status: Home / Self Care | Source: Ambulatory Visit | Attending: Nurse Practitioner | Admitting: Nurse Practitioner

## 2024-05-09 DIAGNOSIS — R7401 Elevation of levels of liver transaminase levels: Secondary | ICD-10-CM | POA: Diagnosis not present

## 2024-05-09 DIAGNOSIS — R5383 Other fatigue: Secondary | ICD-10-CM | POA: Diagnosis not present

## 2024-05-09 DIAGNOSIS — R519 Headache, unspecified: Secondary | ICD-10-CM | POA: Diagnosis not present

## 2024-05-09 DIAGNOSIS — E119 Type 2 diabetes mellitus without complications: Secondary | ICD-10-CM | POA: Diagnosis not present

## 2024-05-09 DIAGNOSIS — K760 Fatty (change of) liver, not elsewhere classified: Secondary | ICD-10-CM

## 2024-05-09 DIAGNOSIS — K7689 Other specified diseases of liver: Secondary | ICD-10-CM | POA: Diagnosis not present

## 2024-05-09 DIAGNOSIS — N951 Menopausal and female climacteric states: Secondary | ICD-10-CM | POA: Diagnosis not present

## 2024-05-09 DIAGNOSIS — R748 Abnormal levels of other serum enzymes: Secondary | ICD-10-CM

## 2024-05-10 DIAGNOSIS — F4310 Post-traumatic stress disorder, unspecified: Secondary | ICD-10-CM | POA: Diagnosis not present

## 2024-05-10 LAB — COLOGUARD: COLOGUARD: NEGATIVE

## 2024-05-11 NOTE — Progress Notes (Signed)
 Great news!  Cologuard is negative.  We can recheck again in 3 years.

## 2024-05-24 DIAGNOSIS — F4310 Post-traumatic stress disorder, unspecified: Secondary | ICD-10-CM | POA: Diagnosis not present

## 2024-05-30 NOTE — Progress Notes (Unsigned)
 Chief Complaint: Elevated LFTs  HPI:    Selena Holt is a 53 year old Caucasian female with a past medical history as listed below including bipolar 1 disorder, diabetes and multiple others, who was referred to me by Kennyth Worth HERO, MD for a complaint of elevated LFTs.      05/03/2024 Cologuard negative.    05/04/2024 alk phos 121, CMP with a glucose of 193, sodium 132, alk phos 148 and ALT 51.  Folate normal.  GGT elevated at 314.    05/04/2024 office visit with Stephane Quest at the liver clinic.  At that time follow-up for MASLD.  Noted a FibroScan in November 2023 with S1 steatosis and focal F0 fibrosis.  Mild elevation in transaminases dating back to 2014.  Patient told to follow-up in a year.    05/09/2024 right upper quadrant ultrasound by Arland Sora with diffuse increased echogenicity of the hepatic parenchyma most likely fatty liver, 5 mm echogenic structure in the upper pole the right kidney.  Past Medical History:  Diagnosis Date   Allergy    Anemia    hx of   Anxiety    hx of   Bipolar disorder (HCC)    Carotid artery occlusion    Collapsed lung    spontaneous , teenager   Constipation    Depression    hx of   Diabetes mellitus without complication (HCC)    on meds   Dyslipidemia associated with type 2 diabetes mellitus (HCC) 02/10/2014   Glaucoma    Heart murmur    HSV-1 (herpes simplex virus 1) infection    Hypertension    on meds   Kidney stones    Meningitis    Scoliosis (and kyphoscoliosis), idiopathic     Past Surgical History:  Procedure Laterality Date   CHEST TUBE INSERTION     TYMPANOSTOMY TUBE PLACEMENT      Current Outpatient Medications  Medication Sig Dispense Refill   Ascorbic Acid (VITAMIN C) 100 MG CHEW      aspirin  81 MG chewable tablet Chew 81 mg by mouth daily.     Barberry-Oreg Grape-Goldenseal (BERBERINE COMPLEX) 200-200-50 MG CAPS Take by mouth.     cetirizine (ZYRTEC) 10 MG tablet Take 10 mg by mouth.     Cholecalciferol  1.25 MG  (50000 UT) TABS Take 1 tablet by mouth daily.     Continuous Glucose Sensor (DEXCOM G7 SENSOR) MISC Apply 1 sensor into the skin every 10 days. 6 each 3   Cyanocobalamin  (VITAMIN B-12 PO) Take 1,200 mcg by mouth. Pt only taking twice a week     EC-RX Testosterone 0.2 % CREA Place onto the skin.     estradiol  (ESTRACE ) 0.1 MG/GM vaginal cream Place 1 Applicatorful vaginally 2 (two) times a week.     estradiol  (VIVELLE -DOT) 0.05 MG/24HR patch Place 1 patch (0.05 mg total) onto the skin 2 (two) times a week. 24 patch 3   folic acid  (FOLVITE ) 400 MCG tablet Take 400 mcg by mouth daily.     glucose blood (ONETOUCH VERIO) test strip Use as instructed to check blood sugar up to 3 times daily 300 each 0   hydrocortisone -pramoxine (ANALPRAM  HC) 2.5-1 % rectal cream Insert 1 application every day by rectal route as needed. 30 g 1   Magnesium 250 MG TABS Take 1 tablet by mouth daily at 2 PM.     METAMUCIL FIBER PO Take by mouth.     NON FORMULARY Take 1 tablet by mouth daily. Mag-Vitamin D -Vitamin  k     OneTouch Delica Lancets 33G MISC Use daily as needed to check blood sugar. 100 each 2   progesterone  (PROMETRIUM ) 100 MG capsule Take 1 capsule (100 mg total) by mouth daily. 90 capsule 3   No current facility-administered medications for this visit.    Allergies as of 05/31/2024 - Review Complete 04/12/2024  Allergen Reaction Noted   Tetanus toxoid-containing vaccines Anaphylaxis 06/01/2012   Ceclor [cefaclor] Hives 06/01/2012   Codeine Hives 05/21/2022   Doxycycline  Other (See Comments) 03/13/2017   Penicillins Hives 06/01/2012   Sulfa antibiotics Hives 06/01/2012   Lisinopril Other (See Comments) 05/09/2016    Family History  Problem Relation Age of Onset   Hyperlipidemia Mother    Hypertension Mother    Hyperlipidemia Father    Hypertension Father    CAD Father    Heart disease Father 16       coronary stent   Other Brother        colostomy    Alzheimer's disease Maternal Grandmother  50   Heart disease Maternal Grandfather    Heart disease Paternal Grandmother    Colon cancer Neg Hx    Colon polyps Neg Hx    Esophageal cancer Neg Hx    Rectal cancer Neg Hx    Stomach cancer Neg Hx    Pancreatic cancer Neg Hx     Social History   Socioeconomic History   Marital status: Single    Spouse name: Not on file   Number of children: 2   Years of education: Not on file   Highest education level: Not on file  Occupational History   Occupation: Furniture Conservator/restorer: [employer]    Comment: [employer]  Tobacco Use   Smoking status: Never   Smokeless tobacco: Never  Vaping Use   Vaping status: Never Used  Substance and Sexual Activity   Alcohol use: No    Alcohol/week: 0.0 standard drinks of alcohol   Drug use: No   Sexual activity: Never  Other Topics Concern   Not on file  Social History Narrative   She lives with her grown son. Human resources for [employer]   Social Drivers of Health   Financial Resource Strain: Low Risk  (01/11/2024)   Overall Financial Resource Strain (CARDIA)    Difficulty of Paying Living Expenses: Not hard at all  Food Insecurity: Low Risk  (05/04/2024)   Received from [facility]   Hunger Vital Sign    Within the past 12 months, you worried that your food would run out before you got money to buy more: Never true    Within the past 12 months, the food you bought just didn't last and you didn't have money to get more. : Never true  Transportation Needs: No Transportation Needs (05/04/2024)   Received from [facility]    In the past 12 months, has lack of reliable transportation kept you from medical appointments, meetings, work or from getting things needed for daily living? : No  Physical Activity: Sufficiently Active (01/11/2024)   Exercise Vital Sign    Days of Exercise per Week: 4 days    Minutes of Exercise per Session: 60 min  Stress: No Stress Concern Present (01/11/2024)   [employer] - Occupational Stress Questionnaire    Feeling of Stress: Not at all  Social Connections: Moderately Integrated (01/11/2024)   Social Connection and Isolation Panel    Frequency of  Communication with Friends and Family: More than three times a week    Frequency of Social Gatherings with Friends and Family: More than three times a week    Attends Religious Services: More than 4 times per year    Active Member of [employer] or Organizations: Yes    Attends Banker Meetings: Never    Marital Status: Never married  Catering Manager Violence: Not on file    Review of Systems:    Constitutional: No weight loss, fever, chills, weakness or fatigue HEENT: Eyes: No change in vision               Ears, Nose, Throat:  No change in hearing or congestion Skin: No rash or itching Cardiovascular: No chest pain, chest pressure or palpitations   Respiratory: No SOB or cough Gastrointestinal: See HPI and otherwise negative Genitourinary: No dysuria or change in urinary frequency Neurological: No headache, dizziness or syncope Musculoskeletal: No new muscle or joint pain Hematologic: No bleeding or bruising Psychiatric: No history of depression or anxiety    Physical Exam:  Vital signs: There were no vitals taken for this visit.  Constitutional:   Pleasant Caucasian female appears to be in NAD, Well developed, Well nourished, alert and cooperative Head:  Normocephalic and atraumatic. Eyes:   PEERL, EOMI. No icterus. Conjunctiva pink. Ears:  Normal auditory acuity. Neck:  Supple Throat: Oral cavity and pharynx without inflammation, swelling or lesion.  Respiratory: Respirations even and unlabored. Lungs clear to auscultation bilaterally.   No wheezes, crackles, or rhonchi.  Cardiovascular: Normal S1, S2. No MRG. Regular rate and rhythm. No peripheral edema, cyanosis or pallor.  Gastrointestinal:  Soft, nondistended, nontender. No rebound or guarding. Normal  bowel sounds. No appreciable masses or hepatomegaly. Rectal:  Not performed.  Msk:  Symmetrical without gross deformities. Without edema, no deformity or joint abnormality.  Neurologic:  Alert and  oriented x4;  grossly normal neurologically.  Skin:   Dry and intact without significant lesions or rashes. Psychiatric: Oriented to person, place and time. Demonstrates good judgement and reason without abnormal affect or behaviors.  RELEVANT LABS AND IMAGING: CBC    Component Value Date/Time   WBC 8.7 04/12/2024 0928   RBC 4.18 04/12/2024 0928   HGB 12.9 04/12/2024 0928   HGB 13.2 09/08/2022 1526   HCT 37.7 04/12/2024 0928   HCT 39.4 09/08/2022 1526   PLT 272.0 04/12/2024 0928   PLT 301 09/08/2022 1526   MCV 90.3 04/12/2024 0928   MCV 93 09/08/2022 1526   MCH 31.3 06/11/2023 1026   MCHC 34.3 04/12/2024 0928   RDW 13.1 04/12/2024 0928   RDW 12.4 09/08/2022 1526   LYMPHSABS 2.3 06/11/2023 1026   LYMPHSABS 2.4 09/08/2022 1526   MONOABS 0.5 06/11/2023 1026   EOSABS 0.0 06/11/2023 1026   EOSABS 0.1 09/08/2022 1526   BASOSABS 0.0 06/11/2023 1026   BASOSABS 0.0 09/08/2022 1526    CMP     Component Value Date/Time   NA 136 04/12/2024 0928   NA 139 02/21/2021 0000   K 4.1 04/12/2024 0928   CL 101 04/12/2024 0928   CO2 27 04/12/2024 0928   GLUCOSE 146 (H) 04/12/2024 0928   BUN 15 04/12/2024 0928   BUN 14 02/21/2021 0000   CREATININE 0.71 04/12/2024 0928   CREATININE 0.69 03/08/2021 1507   CALCIUM  9.9 04/12/2024 0928   PROT 7.2 04/12/2024 0928   ALBUMIN 4.8 04/12/2024 0928   AST 39 (H) 04/12/2024 0928   ALT 71 (H)  04/12/2024 0928   ALKPHOS 124 (H) 04/12/2024 0928   BILITOT 0.5 04/12/2024 0928   GFRNONAA >60 06/11/2023 1026   GFRAA >60 07/14/2016 0525    Assessment: 1. ***  Plan: 1. ***     Delon Failing, PA-C [facility] 05/30/2024, 8:21 AM  Cc: Kennyth Worth HERO, MD

## 2024-05-31 ENCOUNTER — Other Ambulatory Visit (HOSPITAL_BASED_OUTPATIENT_CLINIC_OR_DEPARTMENT_OTHER): Payer: Self-pay

## 2024-05-31 ENCOUNTER — Ambulatory Visit (INDEPENDENT_AMBULATORY_CARE_PROVIDER_SITE_OTHER): Admitting: Physician Assistant

## 2024-05-31 ENCOUNTER — Encounter: Payer: Self-pay | Admitting: Physician Assistant

## 2024-05-31 VITALS — BP 136/80 | HR 79 | Ht 64.0 in | Wt 134.5 lb

## 2024-05-31 DIAGNOSIS — K5900 Constipation, unspecified: Secondary | ICD-10-CM

## 2024-05-31 DIAGNOSIS — K760 Fatty (change of) liver, not elsewhere classified: Secondary | ICD-10-CM

## 2024-05-31 DIAGNOSIS — K644 Residual hemorrhoidal skin tags: Secondary | ICD-10-CM

## 2024-05-31 DIAGNOSIS — K625 Hemorrhage of anus and rectum: Secondary | ICD-10-CM

## 2024-05-31 DIAGNOSIS — K640 First degree hemorrhoids: Secondary | ICD-10-CM | POA: Diagnosis not present

## 2024-05-31 DIAGNOSIS — F4310 Post-traumatic stress disorder, unspecified: Secondary | ICD-10-CM | POA: Diagnosis not present

## 2024-05-31 MED ORDER — HYDROCORTISONE ACETATE 25 MG RE SUPP
25.0000 mg | Freq: Two times a day (BID) | RECTAL | 2 refills | Status: AC
  Filled 2024-05-31: qty 14, 7d supply, fill #0

## 2024-05-31 NOTE — Patient Instructions (Addendum)
 Colace daily.   We have sent the following medications to your pharmacy for you to pick up at your convenience: Hydrocortisone  suppository twice daily for 7 days, may repeat as needed.   _______________________________________________________  If your blood pressure at your visit was 140/90 or greater, please contact your primary care physician to follow up on this.  _______________________________________________________  If you are age 53 or older, your body mass index should be between 23-30. Your Body mass index is 23.09 kg/m. If this is out of the aforementioned range listed, please consider follow up with your Primary Care Provider.  If you are age 48 or younger, your body mass index should be between 19-25. Your Body mass index is 23.09 kg/m. If this is out of the aformentioned range listed, please consider follow up with your Primary Care Provider.   ________________________________________________________  The [facility] providers would like to encourage you to use MYCHART to communicate with providers for non-urgent requests or questions.  Due to long hold times on the telephone, sending your provider a message by [facility] may be a faster and more efficient way to get a response.  Please allow 48 business hours for a response.  Please remember that this is for non-urgent requests.  _______________________________________________________  [facility] is using a team-based approach to care.  Your team is made up of your doctor and two to three APPS. Our APPS (Nurse Practitioners and Physician Assistants) work with your physician to ensure care continuity for you. They are fully qualified to address your health concerns and develop a treatment plan. They communicate directly with your gastroenterologist to care for you. Seeing the Advanced Practice Practitioners on your physician's team can help you by facilitating care more promptly, often allowing for earlier appointments,  access to diagnostic testing, procedures, and other specialty referrals.

## 2024-06-03 NOTE — Progress Notes (Signed)
 Selena Holt                                          MRN: [id number]   06/03/2024   The [facility] Quality Team Specialist reviewed this patient medical record for the purposes of chart review for care gap closure. The following were reviewed: abstraction for care gap closure-kidney health evaluation for diabetes:eGFR  and uACR.    [facility]I Quality Team

## 2024-06-07 ENCOUNTER — Ambulatory Visit: Payer: Self-pay

## 2024-06-07 DIAGNOSIS — F4310 Post-traumatic stress disorder, unspecified: Secondary | ICD-10-CM | POA: Diagnosis not present

## 2024-06-07 NOTE — Telephone Encounter (Signed)
 FYI Only or Action Required?: FYI only for provider: appointment scheduled on 11/19.  Patient was last seen in primary care on 04/12/2024 by Kennyth Worth HERO, MD.  Called Nurse Triage reporting Nasal Congestion.  Symptoms began 3 days ago.  Symptoms are: gradually worsening.  Triage Disposition: See Physician Within 24 Hours  Patient/caregiver understands and will follow disposition?: Yes     Copied from CRM #[id number]. Topic: Clinical - Red Word Triage >> Jun 07, 2024 12:00 PM Alexandria E wrote: [facility] that prompted transfer to Nurse Triage: Cold-like symptoms / discolored mucous. Sore throat, runny nose, and sneezing, yellow mucous. Symptoms going on for 3 days.      Reason for Disposition  [1] SEVERE sore throat AND [2] present > 24 hours  Answer Assessment - Initial Assessment Questions 1. ONSET: When did the nasal discharge start?      3 days ago  3. COUGH: Do you have a cough? If Yes, ask: Describe the color of your mucus. (e.g., clear, white, yellow, green)     Yes, yellow 4. RESPIRATORY DISTRESS: Describe your breathing.      No 5. FEVER: Do you have a fever? If Yes, ask: What is your temperature, how was it measured, and when did it start?     No 6. SEVERITY: Overall, how bad are you feeling right now? (e.g., doesn't interfere with normal activities, staying home from school/work, staying in bed)      Moderate  7. OTHER SYMPTOMS: Do you have any other symptoms? (e.g., earache, mouth sores, sore throat, wheezing)     Sore throat, sneezing, cough  Protocols used: Common Cold-A-AH

## 2024-06-07 NOTE — Telephone Encounter (Signed)
 Appt tomorrow

## 2024-06-08 ENCOUNTER — Other Ambulatory Visit: Payer: Self-pay | Admitting: Surgery

## 2024-06-08 ENCOUNTER — Ambulatory Visit: Admitting: Physician Assistant

## 2024-06-08 ENCOUNTER — Encounter: Payer: Self-pay | Admitting: Physician Assistant

## 2024-06-08 ENCOUNTER — Other Ambulatory Visit (HOSPITAL_BASED_OUTPATIENT_CLINIC_OR_DEPARTMENT_OTHER): Payer: Self-pay

## 2024-06-08 ENCOUNTER — Ambulatory Visit: Admitting: Family Medicine

## 2024-06-08 VITALS — BP 138/72 | HR 90 | Temp 97.8°F | Ht 64.0 in | Wt 138.4 lb

## 2024-06-08 DIAGNOSIS — I6521 Occlusion and stenosis of right carotid artery: Secondary | ICD-10-CM

## 2024-06-08 DIAGNOSIS — J00 Acute nasopharyngitis [common cold]: Secondary | ICD-10-CM

## 2024-06-08 DIAGNOSIS — J029 Acute pharyngitis, unspecified: Secondary | ICD-10-CM | POA: Diagnosis not present

## 2024-06-08 LAB — POCT RAPID STREP A (OFFICE): Rapid Strep A Screen: NEGATIVE

## 2024-06-08 MED ORDER — PREDNISONE 20 MG PO TABS
20.0000 mg | ORAL_TABLET | Freq: Every day | ORAL | 0 refills | Status: AC
  Filled 2024-06-08: qty 4, 4d supply, fill #0

## 2024-06-08 NOTE — Progress Notes (Signed)
 Patient ID: Selena Holt, female    DOB: Dec 04, 1970, 53 y.o.   MRN: [id number]   Assessment & Plan:  Acute nasopharyngitis (common cold)  Acute pharyngitis, unspecified etiology -     POCT rapid strep A  Other orders -     predniSONE ; Take 1 tablet (20 mg total) by mouth daily with breakfast for 4 days.  Dispense: 4 tablet; Refill: 0    Assessment & Plan Acute nasopharyngitis and pharyngitis Four-day history of sneezing, sore throat, rhinorrhea, and postnasal drip. Negative COVID test at home two days ago. No fever, nausea, vomiting, or diarrhea. Examination reveals sinus fluid behind the eardrum and negative strep test. Likely acute viral upper respiratory infection. Differential includes COVID-19, but she declined retesting. Discussed potential use of Paxlovid  for COVID-19, but she opted not to pursue due to low risk and cost considerations. - Prescribed prednisone  20 mg for four days to reduce sinus congestion. - Continue nasal saline, tea with honey, rest, and fluids. - Consider Flonase  nasal spray for allergy symptoms. - Provided work excuse for today and tomorrow to allow rest. - Monitor symptoms; consider antibiotics if symptoms worsen or persist into next week.    F/up prn   Subjective:    Chief Complaint  Patient presents with   Sore Throat   Post Nasal Drip   Nasal Congestion    Sneezing, sore throat, runny nose and post nasal drip started 4 days ago. No fever. No NVD. Covid neg two days ago.     HPI Discussed the use of AI scribe software for clinical note transcription with the patient, who gave verbal consent to proceed.  History of Present Illness Selena Holt is a 53 year old female who presents with four days of sneezing, sore throat, and runny nose.  She has been experiencing sneezing, sore throat, and rhinorrhea for the past four days, with postnasal drip beginning around the same time. The sneezes are described as 'powerful', and the throat and  postnasal drip are the most bothersome symptoms.  A home COVID test performed two days ago was negative. She denies recent travel or contact with children but notes frequent illness among coworkers. No fever, nausea, vomiting, or diarrhea. She has tried Flonase  and Sudafed without significant improvement.  She mentions a 'weird feeling' in her ear and reports that she has previously been told her 'tubes twist'.  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 Her past medical history includes a previous diagnosis of bipolar disorder, which she disputes, and a history of diabetes.     Past Medical History:  Diagnosis Date   Allergy    Anemia    hx of   Anxiety    hx of   Bipolar disorder (HCC)    Carotid artery occlusion    Collapsed lung    spontaneous , teenager   Constipation    Depression    hx of   Diabetes mellitus without complication (HCC)    on meds   Dyslipidemia associated with type 2 diabetes mellitus (HCC) 02/10/2014   Glaucoma    Heart murmur    HSV-1 (herpes simplex virus 1) infection    Hypertension    on meds   Kidney stones    Meningitis    Scoliosis (and kyphoscoliosis), idiopathic     Past Surgical History:  Procedure Laterality Date   CHEST TUBE INSERTION     TYMPANOSTOMY TUBE  PLACEMENT      Family History  Problem Relation Age of Onset   Hyperlipidemia Mother    Hypertension Mother    Hyperlipidemia Father    Hypertension Father    CAD Father    Heart disease Father 51       coronary stent   Other Brother        colostomy    Alzheimer's disease Maternal Grandmother 50   Heart disease Maternal Grandfather    Heart disease Paternal Grandmother    Stroke Paternal Grandmother    Colon cancer Neg Hx    Colon polyps Neg Hx    Esophageal cancer Neg Hx    Rectal cancer Neg Hx    Stomach cancer Neg Hx    Pancreatic cancer Neg Hx     Social History   Tobacco Use    Smoking status: Never   Smokeless tobacco: Never  Vaping Use   Vaping status: Never Used  Substance Use Topics   Alcohol use: No    Alcohol/week: 0.0 standard drinks of alcohol   Drug use: No     Allergies  Allergen Reactions   Tetanus Toxoid-Containing Vaccines Anaphylaxis    tetanus   Ceclor [Cefaclor] Hives   Codeine Hives   Doxycycline  Other (See Comments)    Hives   Penicillins Hives   Sulfa Antibiotics Hives   Lisinopril Other (See Comments)    fatigue    Review of Systems NEGATIVE UNLESS OTHERWISE INDICATED IN HPI      Objective:     BP 138/72   Pulse 90   Temp 97.8 F (36.6 C) (Temporal)   Ht 5' 4 (1.626 m)   Wt 138 lb 6.4 oz (62.8 kg)   SpO2 98%   BMI 23.76 kg/m   Wt Readings from Last 3 Encounters:  06/08/24 138 lb 6.4 oz (62.8 kg)  05/31/24 134 lb 8 oz (61 kg)  04/12/24 134 lb 9.6 oz (61.1 kg)    BP Readings from Last 3 Encounters:  06/08/24 138/72  05/31/24 136/80  04/12/24 (!) 146/79     Physical Exam Vitals and nursing note reviewed.  Constitutional:      General: She is not in acute distress.    Appearance: Normal appearance. She is not ill-appearing.  HENT:     Head: Normocephalic.     Right Ear: Tympanic membrane, ear canal and external ear normal.     Left Ear: Tympanic membrane, ear canal and external ear normal.     Nose: Congestion and rhinorrhea present.     Mouth/Throat:     Mouth: Mucous membranes are moist.     Pharynx: Uvula midline. No oropharyngeal exudate.     Comments: Mild erythema posterior pharynx, postnasal drip noted Eyes:     Extraocular Movements: Extraocular movements intact.     Conjunctiva/sclera: Conjunctivae normal.     Pupils: Pupils are equal, round, and reactive to light.  Cardiovascular:     Rate and Rhythm: Normal rate and regular rhythm.     Pulses: Normal pulses.     Heart sounds: Normal heart sounds. No murmur heard. Pulmonary:     Effort: Pulmonary effort is normal. No respiratory  distress.     Breath sounds: Normal breath sounds. No wheezing.  Musculoskeletal:     Cervical back: Normal range of motion.  Skin:    General: Skin is warm.  Neurological:     Mental Status: She is alert and oriented to person, place, and time.  Psychiatric:  Mood and Affect: Mood normal.        Behavior: Behavior normal.             Manuel Lawhead M Zanovia Rotz, PA-C

## 2024-06-09 DIAGNOSIS — F419 Anxiety disorder, unspecified: Secondary | ICD-10-CM | POA: Diagnosis not present

## 2024-06-09 DIAGNOSIS — N951 Menopausal and female climacteric states: Secondary | ICD-10-CM | POA: Diagnosis not present

## 2024-06-09 DIAGNOSIS — E119 Type 2 diabetes mellitus without complications: Secondary | ICD-10-CM | POA: Diagnosis not present

## 2024-06-09 DIAGNOSIS — K760 Fatty (change of) liver, not elsewhere classified: Secondary | ICD-10-CM | POA: Diagnosis not present

## 2024-06-10 DIAGNOSIS — Z13820 Encounter for screening for osteoporosis: Secondary | ICD-10-CM | POA: Diagnosis not present

## 2024-06-20 ENCOUNTER — Other Ambulatory Visit (HOSPITAL_BASED_OUTPATIENT_CLINIC_OR_DEPARTMENT_OTHER): Payer: Self-pay

## 2024-06-21 DIAGNOSIS — F4310 Post-traumatic stress disorder, unspecified: Secondary | ICD-10-CM | POA: Diagnosis not present

## 2024-07-05 DIAGNOSIS — F4310 Post-traumatic stress disorder, unspecified: Secondary | ICD-10-CM | POA: Diagnosis not present

## 2024-07-17 ENCOUNTER — Encounter: Payer: Self-pay | Admitting: Family Medicine

## 2024-07-18 ENCOUNTER — Encounter: Payer: Self-pay | Admitting: Family Medicine

## 2024-07-18 ENCOUNTER — Ambulatory Visit: Admitting: Family Medicine

## 2024-07-18 ENCOUNTER — Other Ambulatory Visit (HOSPITAL_BASED_OUTPATIENT_CLINIC_OR_DEPARTMENT_OTHER): Payer: Self-pay

## 2024-07-18 VITALS — BP 112/78 | HR 76 | Temp 97.2°F | Resp 99 | Ht 64.0 in | Wt 138.0 lb

## 2024-07-18 DIAGNOSIS — K7689 Other specified diseases of liver: Secondary | ICD-10-CM | POA: Diagnosis not present

## 2024-07-18 DIAGNOSIS — N951 Menopausal and female climacteric states: Secondary | ICD-10-CM | POA: Diagnosis not present

## 2024-07-18 DIAGNOSIS — F4310 Post-traumatic stress disorder, unspecified: Secondary | ICD-10-CM | POA: Diagnosis not present

## 2024-07-18 DIAGNOSIS — E1159 Type 2 diabetes mellitus with other circulatory complications: Secondary | ICD-10-CM | POA: Diagnosis not present

## 2024-07-18 DIAGNOSIS — I152 Hypertension secondary to endocrine disorders: Secondary | ICD-10-CM

## 2024-07-18 DIAGNOSIS — E119 Type 2 diabetes mellitus without complications: Secondary | ICD-10-CM

## 2024-07-18 LAB — POCT GLYCOSYLATED HEMOGLOBIN (HGB A1C): Hemoglobin A1C: 6.5 % — AB (ref 4.0–5.6)

## 2024-07-18 NOTE — Assessment & Plan Note (Signed)
 A1c improved to 6.5.  Patient on A1c reduction.  We did discuss treatment options including GLP agonist however she would like to hold off on this for now due to concerns about constipation.  She is currently working through pelvic floor rehab to help with this and her therapist advised her to hold off on GLP agonist for now.  We also did discuss restarting metformin  however she would like to hold off on this for now as well.  She will continue to work on lifestyle interventions.  Recheck A1c in 3 to 6 months.

## 2024-07-18 NOTE — Progress Notes (Signed)
" ° °  Selena Holt is a 53 y.o. female who presents today for an office visit.  Assessment/Plan:  Chronic Problems Addressed Today: T2DM (type 2 diabetes mellitus) (HCC) A1c improved to 6.5.  Patient on A1c reduction.  We did discuss treatment options including GLP agonist however she would like to hold off on this for now due to concerns about constipation.  She is currently working through pelvic floor rehab to help with this and her therapist advised her to hold off on GLP agonist for now.  We also did discuss restarting metformin  however she would like to hold off on this for now as well.  She will continue to work on lifestyle interventions.  Recheck A1c in 3 to 6 months.  Hypertension associated with diabetes (HCC) Blood pressure at goal today without medications.  Hepatocellular dysfunction Following with liver clinic.  Recently had labs done as well as right upper quadrant ultrasound which showed diffuse steatosis.  We did discuss GLP as above however she would like to hold off on this for now due to constipation concerns.  Menopausal symptoms She is on hormone replacement therapy per gynecology which has significantly improved her symptoms.     Subjective:  HPI:  See assessment / plan for status of chronic conditions.  Patient is here today for follow-up.  I last saw her about 3 months ago.  A1c at that time 7.1.     Discussed the use of AI scribe software for clinical note transcription with the patient, who gave verbal consent to proceed.  History of Present Illness Selena Holt is a 53 year old female with diabetes who presents for a follow-up visit.  Her A1c has improved from 7.1 three months ago to 6.5 today. Blood sugar levels vary, with morning readings between 159 and 170 mg/dL, and postprandial levels ranging from 115 to 150 mg/dL. She feels better with higher blood sugar levels, as lower levels cause fatigue and weakness. She has not been on metformin , preferring  to focus on reducing sugar intake and increasing physical activity.  She has been attending pelvic floor physical therapy and has discussed her diabetes and liver issues with the therapist. She experiences constipation, which is a concern in relation to potential GLP-1 therapy, as it could exacerbate the condition.  She is on hormone replacement therapy and notes that stable estrogen levels result in less stress and lower blood pressure and pulse. However, fluctuations in hormone levels lead to increased blood pressure.         Objective:  Physical Exam: BP 112/78   Pulse 76   Temp (!) 97.2 F (36.2 C) (Temporal)   Resp (!) 99   Ht 5' 4 (1.626 m)   Wt 138 lb (62.6 kg)   BMI 23.69 kg/m   Gen: No acute distress, resting comfortably CV: Regular rate and rhythm with no murmurs appreciated Pulm: Normal work of breathing, clear to auscultation bilaterally with no crackles, wheezes, or rhonchi Neuro: Grossly normal, moves all extremities Psych: Normal affect and thought content      Selena Losh M. Kennyth, MD 07/18/2024 8:38 AM  "

## 2024-07-18 NOTE — Assessment & Plan Note (Signed)
 She is on hormone replacement therapy per gynecology which has significantly improved her symptoms.

## 2024-07-18 NOTE — Assessment & Plan Note (Signed)
Blood pressure at goal today without medications.

## 2024-07-18 NOTE — Patient Instructions (Signed)
 It was very nice to see you today!  VISIT SUMMARY: Today, you had a follow-up visit to discuss your diabetes, liver health, and menopausal symptoms. Your A1c has improved, and you are managing your diabetes with diet and exercise. We also reviewed your kidney stone and hormone replacement therapy.  YOUR PLAN: TYPE 2 DIABETES MELLITUS: Your blood sugar control has improved, with your A1c now at 6.5%. -Continue your current diabetes management plan, focusing on reducing sugar and carbohydrate intake. -Aim for 30 minutes of exercise daily. -We will schedule a follow-up in 6 months.  HEPATOCELLULAR DYSFUNCTION: Your kidney stone is stable and not causing any immediate concerns. -Continue with annual ultrasound monitoring of the kidney stone.  MENOPAUSAL SYMPTOMS: Your symptoms are well-controlled with hormone replacement therapy, though you experience occasional fluctuations. -Continue your current hormone replacement therapy.  Return in about 6 months (around 01/16/2025) for Follow Up.   Take care, Dr Kennyth  PLEASE NOTE:  If you had any lab tests, please let us  know if you have not heard back within a few days. You may see your results on mychart before we have a chance to review them but we will give you a call once they are reviewed by us .   If we ordered any referrals today, please let us  know if you have not heard from their office within the next week.   If you had any urgent prescriptions sent in today, please check with the pharmacy within an hour of our visit to make sure the prescription was transmitted appropriately.   Please try these tips to maintain a healthy lifestyle:  Eat at least 3 REAL meals and 1-2 snacks per day.  Aim for no more than 5 hours between eating.  If you eat breakfast, please do so within one hour of getting up.   Each meal should contain half fruits/vegetables, one quarter protein, and one quarter carbs (no bigger than a computer mouse)  Cut down on  sweet beverages. This includes juice, soda, and sweet tea.   Drink at least 1 glass of water with each meal and aim for at least 8 glasses per day  Exercise at least 150 minutes every week.

## 2024-07-18 NOTE — Assessment & Plan Note (Signed)
 Following with liver clinic.  Recently had labs done as well as right upper quadrant ultrasound which showed diffuse steatosis.  We did discuss GLP as above however she would like to hold off on this for now due to constipation concerns.

## 2024-07-19 ENCOUNTER — Other Ambulatory Visit (HOSPITAL_BASED_OUTPATIENT_CLINIC_OR_DEPARTMENT_OTHER): Payer: Self-pay

## 2024-07-19 ENCOUNTER — Other Ambulatory Visit: Payer: Self-pay | Admitting: Family Medicine

## 2024-07-19 NOTE — Telephone Encounter (Signed)
 Labs for review. Already abstracted.

## 2024-07-19 NOTE — Progress Notes (Signed)
 Lab abstraction

## 2024-08-01 ENCOUNTER — Ambulatory Visit: Admitting: Physician Assistant

## 2024-08-01 ENCOUNTER — Other Ambulatory Visit: Payer: Self-pay

## 2024-08-01 ENCOUNTER — Encounter: Payer: Self-pay | Admitting: Surgery

## 2024-08-01 ENCOUNTER — Ambulatory Visit (HOSPITAL_COMMUNITY)
Admission: RE | Admit: 2024-08-01 | Discharge: 2024-08-01 | Disposition: A | Discharge status: Home / Self Care | Source: Ambulatory Visit | Attending: Surgery | Admitting: Surgery

## 2024-08-01 VITALS — BP 133/82 | HR 81 | Temp 97.7°F | Wt 137.2 lb

## 2024-08-01 DIAGNOSIS — E1169 Type 2 diabetes mellitus with other specified complication: Secondary | ICD-10-CM | POA: Diagnosis not present

## 2024-08-01 DIAGNOSIS — I6521 Occlusion and stenosis of right carotid artery: Secondary | ICD-10-CM | POA: Insufficient documentation

## 2024-08-01 DIAGNOSIS — E785 Hyperlipidemia, unspecified: Secondary | ICD-10-CM | POA: Diagnosis not present

## 2024-08-01 DIAGNOSIS — I6523 Occlusion and stenosis of bilateral carotid arteries: Secondary | ICD-10-CM

## 2024-08-02 NOTE — Progress Notes (Signed)
 "   Office Note   History of Present Illness   Selena Holt is a 54 y.o. (13-Nov-1970) female who presents for surveillance of carotid artery stenosis.  The patient was first evaluated by Selena Holt in 2024 after incidental finding of 50% right sided carotid stenosis on CTA.  She has no prior history of stroke or TIA.  She returns today for follow-up.  She says she is doing fairly well at today's office visit.  She denies any strokelike symptoms such as slurred speech, facial droop, or sudden weakness/numbness in the extremities.  She also denies any amaurosis fugax.  She does endorse new onset vision changes in the left eye over the past couple of months.  She says randomly once or twice every week or so she has a kaleidoscope of orbs moving around her left eye.  This will occur and resolve spontaneously.  These episodes last around 10 minutes.  She has seen an ophthalmologist and they have diagnosed these episodes as optical auras.  She is taking a daily aspirin .  She has been recommended to take a statin, however has not wanted to take one recently due to elevated liver enzymes.  Current Outpatient Medications  Medication Sig Dispense Refill   Ascorbic Acid (VITAMIN C) 100 MG CHEW      aspirin  81 MG chewable tablet Chew 81 mg by mouth daily.     Barberry-Oreg Grape-Goldenseal (BERBERINE COMPLEX) 200-200-50 MG CAPS Take by mouth.     cetirizine (ZYRTEC) 10 MG tablet Take 10 mg by mouth. (Patient taking differently: Take 10 mg by mouth as needed.)     Cholecalciferol  1.25 MG (50000 UT) TABS Take 1 tablet by mouth daily. (Patient not taking: Reported on 06/08/2024)     Cyanocobalamin  (VITAMIN B-12 PO) Take 1,200 mcg by mouth. Pt only taking twice a week     Docusate Sodium (DSS) 100 MG CAPS Take 100 mg by mouth.     EC-RX Testosterone  0.2 % CREA Place onto the skin.     estradiol  (ESTRACE ) 0.1 MG/GM vaginal cream Place 1 Applicatorful vaginally 2 (two) times a week.     estradiol   (VIVELLE -DOT) 0.05 MG/24HR patch Place 1 patch (0.05 mg total) onto the skin 2 (two) times a week. 24 patch 3   folic acid  (FOLVITE ) 400 MCG tablet Take 400 mcg by mouth daily.     glucose blood (ONETOUCH VERIO) test strip Use as instructed to check blood sugar up to 3 times daily 300 each 0   hydrocortisone  (ANUSOL -HC) 25 MG suppository Place 1 suppository (25 mg total) rectally every 12 (twelve) hours. 14 suppository 2   hydrocortisone -pramoxine (ANALPRAM  HC) 2.5-1 % rectal cream Insert 1 application every day by rectal route as needed. 30 g 1   Magnesium 250 MG TABS Take 1 tablet by mouth daily at 2 PM.     NON FORMULARY Take 1 tablet by mouth daily. Mag-Vitamin D -Vitamin k     NONFORMULARY OR COMPOUNDED ITEM take 1 capsule by mouth at bedtime nightly     OneTouch Delica Lancets 33G MISC Use daily as needed to check blood sugar. 100 each 2   progesterone  (PROMETRIUM ) 100 MG capsule Take 1 capsule (100 mg total) by mouth daily. 90 capsule 3   No current facility-administered medications for this visit.    REVIEW OF SYSTEMS (negative unless checked):   Cardiac:  []  Chest pain or chest pressure? []  Shortness of breath upon activity? []  Shortness of breath when lying flat? []  Irregular  heart rhythm?  Vascular:  []  Pain in calf, thigh, or hip brought on by walking? []  Pain in feet at night that wakes you up from your sleep? []  Blood clot in your veins? []  Leg swelling?  Pulmonary:  []  Oxygen at home? []  Productive cough? []  Wheezing?  Neurologic:  []  Sudden weakness in arms or legs? []  Sudden numbness in arms or legs? []  Sudden onset of difficult speaking or slurred speech? []  Temporary loss of vision in one eye? []  Problems with dizziness?  Gastrointestinal:  []  Blood in stool? []  Vomited blood?  Genitourinary:  []  Burning when urinating? []  Blood in urine?  Psychiatric:  []  Major depression  Hematologic:  []  Bleeding problems? []  Problems with blood  clotting?  Dermatologic:  []  Rashes or ulcers?  Constitutional:  []  Fever or chills?  Ear/Nose/Throat:  []  Change in hearing? []  Nose bleeds? []  Sore throat?  Musculoskeletal:  []  Back pain? []  Joint pain? []  Muscle pain?   Physical Examination   Vitals:   08/01/24 0926 08/01/24 0928  BP: 138/87 133/82  Pulse: 81 81  Temp: 97.7 F (36.5 C)   TempSrc: Temporal   Weight: 137 lb 3.2 oz (62.2 kg)    Body mass index is 23.55 kg/m.  General:  WDWN in NAD; vital signs documented above Gait: Not observed HENT: WNL, normocephalic Pulmonary: normal non-labored breathing  Cardiac: regular Abdomen: soft, NT, no masses Skin: without rashes Vascular Exam/Pulses: palpable radial pulses bilaterally Extremities: without ischemic changes, without gangrene , without cellulitis; without open wounds;  Musculoskeletal: no muscle wasting or atrophy  Neurologic: A&O X 3;  No focal weakness or paresthesias are detected Psychiatric:  The pt has normal affect  Non-Invasive Vascular Imaging   Bilateral Carotid Duplex (08/01/2024):  R ICA stenosis:  80-99% R VA:  patent and antegrade L ICA stenosis:  40-59% L VA:  patent and antegrade   Medical Decision Making   Selena Holt is a 54 y.o. female who presents for surveillance of carotid artery stenosis  Based on the patient's vascular studies, her carotid stenosis has increased bilaterally.  CT angio in November 2024 demonstrated 50% stenosis of the right ICA and less than 50% stenosis of the left ICA.  Today's duplex demonstrates 80 to 99% stenosis of the right ICA and 40 to 59% stenosis of the left ICA She denies any strokelike symptoms such as slurred speech, facial droop, sudden weakness/numbness in the extremities, or amaurosis fugax.  She does endorse recent, intermittent vision changes in the left eye over the past couple of months.  She states that she will intermittently have a kaleidoscope of colors appear in her left eye.   She has been seen by an ophthalmologist and has been diagnosed with optical auras without migraines. On exam she is neurologically intact.  She has palpable and equal radial pulses bilaterally. I have explained to the patient that her carotid duplex demonstrates increased carotid stenosis bilaterally, with 80 to 99% stenosis of the right ICA.  Previous CTA in November 2024 graded her right ICA stenosis as 50%.  She will require CTA angio/neck to confirm if her stenosis is truly greater than 80% on the right.  I have explained to the patient that if her stenosis is greater than 80%, she would benefit from carotid revascularization including carotid endarterectomy versus TCAR.  This would be for asymptomatic critical ICA stenosis.  I have also reassured the patient I do not think that her visual auras in the left eye  are due to carotid disease. I have encouraged the patient to continue taking a daily baby aspirin .  She has been hesitant about taking a statin for her cholesterol.  I will refer her to our pharmacists for assistance with lipid management. I will order the patient a CTA head/neck and she can follow-up with Selena Holt in the next few weeks to discuss her results  Ahmed Holster PA-C [facility] Office: [phone]  Clinic MD: Holt  "

## 2024-08-08 ENCOUNTER — Other Ambulatory Visit: Payer: Self-pay | Admitting: Surgery

## 2024-08-08 ENCOUNTER — Encounter: Payer: Self-pay | Admitting: Family Medicine

## 2024-08-08 ENCOUNTER — Ambulatory Visit (HOSPITAL_COMMUNITY)
Admission: RE | Admit: 2024-08-08 | Discharge: 2024-08-08 | Disposition: A | Discharge status: Home / Self Care | Source: Ambulatory Visit | Attending: Surgery | Admitting: Surgery

## 2024-08-08 DIAGNOSIS — I6523 Occlusion and stenosis of bilateral carotid arteries: Secondary | ICD-10-CM | POA: Insufficient documentation

## 2024-08-08 LAB — POCT I-STAT CREATININE: Creatinine, Ser: 0.9 mg/dL (ref 0.44–1.00)

## 2024-08-08 MED ORDER — IOHEXOL 350 MG/ML SOLN
75.0000 mL | Freq: Once | INTRAVENOUS | Status: AC | PRN
  Administered 2024-08-08: 75 mL via INTRAVENOUS

## 2024-08-17 NOTE — Progress Notes (Unsigned)
 " [facility] Pharmacist Note  Name: Selena Holt  MRN: [id number]  DOB: 07/02/1971  Sex: female PCP: Kennyth Worth HERO, MD {CPP Referral Provider:[id number]:::1}  HISTORY OF PRESENT ILLNESS: Selena Holt is a 54 y.o. female with PMH bilateral carotid artery stenosis who presents for medication management for cardiovascular risk reduction.   Dyslipidemia/ASCVD  Current lipid-lowering medications:  Previously tried medications/intolerances: Rx affordability and access:  Current antiplatelets/antithrombotics:   Current dietary habits:   Breakfast: ***  Lunch: ***  Supper: ***  Snacks: ***  Drinks: ***   Current physical activity: ***   Patient {ACTION; IS/IS NOT:21021397} up to date on annual influenza vaccine.  Patient {ACTION; IS/IS NOT:21021397} up to date on COVID vaccines.   Past Medical History:  Diagnosis Date   Allergy    Anemia    hx of   Anxiety    hx of   Bipolar disorder (HCC)    Carotid artery occlusion    Collapsed lung    spontaneous , teenager   Constipation    Depression    hx of   Diabetes mellitus without complication (HCC)    on meds   Dyslipidemia associated with type 2 diabetes mellitus (HCC) 02/10/2014   Glaucoma    Heart murmur    HSV-1 (herpes simplex virus 1) infection    Hypertension    on meds   Kidney stones    Meningitis    Scoliosis (and kyphoscoliosis), idiopathic    Past Surgical History:  Procedure Laterality Date   CHEST TUBE INSERTION     TYMPANOSTOMY TUBE PLACEMENT     Family History  Problem Relation Age of Onset   Hyperlipidemia Mother    Hypertension Mother    Hyperlipidemia Father    Hypertension Father    CAD Father    Heart disease Father 39       coronary stent   Other Brother        colostomy    Alzheimer's disease Maternal Grandmother 50   Heart disease Maternal Grandfather    Heart disease Paternal Grandmother    Stroke Paternal Grandmother    Colon cancer Neg Hx    Colon polyps Neg Hx    Esophageal  cancer Neg Hx    Rectal cancer Neg Hx    Stomach cancer Neg Hx    Pancreatic cancer Neg Hx    LABS: Lab Results  Component Value Date   CHOL 183 01/11/2024   HDL 49.10 01/11/2024   LDLCALC 109 (H) 01/11/2024   TRIG 125.0 01/11/2024   CHOLHDL 4 01/11/2024    Lab Results  Component Value Date   CREATININE 0.90 08/08/2024   BUN 15 05/04/2024   NA 132 (A) 05/04/2024   K 4.2 05/04/2024   CL 97 (A) 05/04/2024   CO2 22 05/04/2024   CrCl cannot be calculated (Unknown ideal weight.).      Component Value Date/Time   PROT 7.2 04/12/2024 0928   ALBUMIN 4.5 05/04/2024 0000   AST 22 05/04/2024 0000   ALT 51 (A) 05/04/2024 0000   ALKPHOS 148 (A) 05/04/2024 0000   BILITOT 0.5 04/12/2024 0928   BILIDIR 0.1 10/21/2012 1549   IBILI 0.4 10/21/2012 1549    Lab Results  Component Value Date   HGBA1C 6.5 (A) 07/18/2024    ASSESSMENT & PLAN:  Dyslipidemia LDL {Desc; above/below:16086} goal <*** mg/dL.   ***  Counseled patient on treatment, including efficacy, dosing, administration, possible adverse effects, and anticipated cost.  Reviewed long-term complications of  uncontrolled cholesterol.  Reviewed goals for cholesterol readings with patient.  Reviewed dietary and lifestyle modifications to improve cholesterol.  Repeat lipid panel in 4-12 weeks after medication changes.   Antiplatelets/Antithrombotics Patient with {recent/no recent:33658} revascularization.  ***  Counseled patient on treatment, including efficacy, dosing, administration, possible adverse effects, and anticipated cost.    Recommend annual influenza and COVID vaccines.   Follow up: ***  Izetta Henry, PharmD, CPP [facility] [phone]  "

## 2024-08-18 ENCOUNTER — Ambulatory Visit: Admitting: Pharmacist

## 2024-09-02 ENCOUNTER — Ambulatory Visit: Admitting: Pharmacist

## 2024-09-12 ENCOUNTER — Ambulatory Visit: Admitting: Surgery

## 2025-01-16 ENCOUNTER — Ambulatory Visit: Admitting: Family Medicine
